# Patient Record
Sex: Female | Born: 1950
Health system: Southern US, Community
[De-identification: ages and names within clinical notes are randomized; demographics above are authoritative.]

## PROBLEM LIST (undated history)

## (undated) DIAGNOSIS — N200 Calculus of kidney: Secondary | ICD-10-CM

## (undated) DIAGNOSIS — R32 Unspecified urinary incontinence: Secondary | ICD-10-CM

## (undated) DIAGNOSIS — I10 Essential (primary) hypertension: Secondary | ICD-10-CM

## (undated) DIAGNOSIS — M1990 Unspecified osteoarthritis, unspecified site: Secondary | ICD-10-CM

## (undated) DIAGNOSIS — E119 Type 2 diabetes mellitus without complications: Secondary | ICD-10-CM

## (undated) DIAGNOSIS — N390 Urinary tract infection, site not specified: Secondary | ICD-10-CM

## (undated) DIAGNOSIS — Z972 Presence of dental prosthetic device (complete) (partial): Secondary | ICD-10-CM

## (undated) DIAGNOSIS — T7840XA Allergy, unspecified, initial encounter: Secondary | ICD-10-CM

## (undated) HISTORY — PX: TONSILLECTOMY: SUR1361

## (undated) HISTORY — DX: Urinary tract infection, site not specified: N39.0

## (undated) HISTORY — PX: APPENDECTOMY: SHX54

## (undated) HISTORY — DX: Unspecified urinary incontinence: R32

## (undated) HISTORY — DX: Calculus of kidney: N20.0

## (undated) HISTORY — DX: Unspecified osteoarthritis, unspecified site: M19.90

## (undated) HISTORY — DX: Allergy, unspecified, initial encounter: T78.40XA

---

## 1991-04-22 HISTORY — PX: CHOLECYSTECTOMY: SHX55

## 2012-05-22 DEATH — deceased

## 2016-07-08 ENCOUNTER — Encounter: Payer: Self-pay | Admitting: Family Medicine

## 2016-07-08 DIAGNOSIS — R32 Unspecified urinary incontinence: Secondary | ICD-10-CM

## 2016-07-08 DIAGNOSIS — Z889 Allergy status to unspecified drugs, medicaments and biological substances status: Secondary | ICD-10-CM

## 2016-07-08 DIAGNOSIS — M1990 Unspecified osteoarthritis, unspecified site: Secondary | ICD-10-CM

## 2016-07-08 DIAGNOSIS — M1711 Unilateral primary osteoarthritis, right knee: Secondary | ICD-10-CM | POA: Insufficient documentation

## 2016-07-11 ENCOUNTER — Encounter: Payer: Self-pay | Admitting: Family Medicine

## 2016-07-11 ENCOUNTER — Ambulatory Visit (INDEPENDENT_AMBULATORY_CARE_PROVIDER_SITE_OTHER): Payer: Medicare Other | Admitting: Family Medicine

## 2016-07-11 VITALS — BP 142/88 | HR 69 | Temp 98.2°F | Resp 16 | Ht 62.0 in | Wt 163.5 lb

## 2016-07-11 DIAGNOSIS — E663 Overweight: Secondary | ICD-10-CM

## 2016-07-11 DIAGNOSIS — Z1211 Encounter for screening for malignant neoplasm of colon: Secondary | ICD-10-CM

## 2016-07-11 DIAGNOSIS — M1711 Unilateral primary osteoarthritis, right knee: Secondary | ICD-10-CM | POA: Diagnosis not present

## 2016-07-11 DIAGNOSIS — S61012A Laceration without foreign body of left thumb without damage to nail, initial encounter: Secondary | ICD-10-CM | POA: Diagnosis not present

## 2016-07-11 DIAGNOSIS — E1165 Type 2 diabetes mellitus with hyperglycemia: Secondary | ICD-10-CM

## 2016-07-11 DIAGNOSIS — R030 Elevated blood-pressure reading, without diagnosis of hypertension: Secondary | ICD-10-CM

## 2016-07-11 DIAGNOSIS — Z1231 Encounter for screening mammogram for malignant neoplasm of breast: Secondary | ICD-10-CM

## 2016-07-11 DIAGNOSIS — Z114 Encounter for screening for human immunodeficiency virus [HIV]: Secondary | ICD-10-CM

## 2016-07-11 DIAGNOSIS — Z23 Encounter for immunization: Secondary | ICD-10-CM | POA: Diagnosis not present

## 2016-07-11 DIAGNOSIS — E1169 Type 2 diabetes mellitus with other specified complication: Secondary | ICD-10-CM | POA: Insufficient documentation

## 2016-07-11 DIAGNOSIS — Z1159 Encounter for screening for other viral diseases: Secondary | ICD-10-CM | POA: Diagnosis not present

## 2016-07-11 DIAGNOSIS — Z7689 Persons encountering health services in other specified circumstances: Secondary | ICD-10-CM | POA: Diagnosis not present

## 2016-07-11 DIAGNOSIS — R7309 Other abnormal glucose: Secondary | ICD-10-CM

## 2016-07-11 DIAGNOSIS — Z1239 Encounter for other screening for malignant neoplasm of breast: Secondary | ICD-10-CM

## 2016-07-11 DIAGNOSIS — E669 Obesity, unspecified: Secondary | ICD-10-CM | POA: Insufficient documentation

## 2016-07-11 DIAGNOSIS — E119 Type 2 diabetes mellitus without complications: Secondary | ICD-10-CM | POA: Insufficient documentation

## 2016-07-11 DIAGNOSIS — Z205 Contact with and (suspected) exposure to viral hepatitis: Secondary | ICD-10-CM

## 2016-07-11 DIAGNOSIS — IMO0001 Reserved for inherently not codable concepts without codable children: Secondary | ICD-10-CM

## 2016-07-11 DIAGNOSIS — I10 Essential (primary) hypertension: Secondary | ICD-10-CM | POA: Insufficient documentation

## 2016-07-11 MED ORDER — VITAMIN D3 50 MCG (2000 UT) PO CAPS: 2000.0000 [IU] | ORAL_CAPSULE | Freq: Every day | ORAL | Status: DC

## 2016-07-11 NOTE — Assessment & Plan Note (Signed)
Prior Pre-DM, unclear range without results, had been improved in interval - No recent labs available - Check A1c

## 2016-07-11 NOTE — Patient Instructions (Signed)
Thank you for coming in to clinic today.  1. Ordered all fasting blood work through [facility] - orders are good for next 6 weeks  Will release results to MyChart with information  2. Keep up the good work overall - Now with warmer weather, recommend to resume regular exercise, try to follow a low sodium diet - Caution with caffeine, 1 drink daily is fine, try to limit the pop to reduce sugar/caffeine may help BP - Check BP more regularly now about few times weekly for now to make sure it is not staying elevated, if it remains >140/90 persistently then we should re-consider diagnosis of HTN and discuss medications  PNeumonia vaccine (Prevnar -13) today - next due 1 year for PNeumovax-23  TDap today, good for 10 years ----- [facility] [address] [place], [place] [address] Phone: [phone] Call anytime to schedule now that order has been placed - FIRST Mammogram you likely need to go in person to sign their paperwork first and get old mammogram records ------  Colon Cancer Screening: - For all adults age 50+ routine colon cancer screening is highly recommended. - Early detection of colon cancer is important, because often there are no warning signs or symptoms, also if found early usually it can be cured. Late stage is hard to treat.  - If you are not interested in Colonoscopy screening (if done and normal you could be cleared for 5 to 10 years until next due), then Cologuard is an excellent alternative for screening test for Colon Cancer. It is highly sensitive for detecting DNA of colon cancer from even the earliest stages. Also, there is NO bowel prep required. - If Cologuard is NEGATIVE, then it is good for 3 years before next due - If Cologuard is POSITIVE, then it is strongly advised to get a Colonoscopy, which allows the GI doctor to locate the source of the cancer or polyp (even very early stage) and treat it by removing  it. ------------------------- If you would like to proceed with Cologuard (stool DNA test) - FIRST, call your insurance company and tell them you want to check cost of Cologuard tell them CPT Code 81528 (it may be completely covered and you could get for no cost, OR max cost without any coverage is about $600). Also, keep in mind if you do NOT open the kit, and decide not to do the test, you will NOT be charged, you should contact the company if you decide not to do the test. - If you want to proceed, you can notify us (phone message, MyChart Message, or at next visit) and we will order it for you. The test kit will be delivered to you house within about 1 week. Follow instructions to collect sample, you may call the company for any help or questions, 24/7 telephone support at [phone].   Please schedule a follow-up appointment with Dr. Karamalegos in 3 months follow-up BP, Lab results  If you have any other questions or concerns, please feel free to call the clinic or send a message through MyChart. You may also schedule an earlier appointment if necessary.  Alexander Karamalegos, DO [facility], [facility]  

## 2016-07-11 NOTE — Assessment & Plan Note (Signed)
Asymptomatic today, h/o chronic R medial Knee pain without swelling without known injury or trauma. Suspected likely due to underlying osteoarthritis / DJD by history, without available imaging, also some mild OA in other joints (thumbs) - Able to bear weight, no knee instability, mechanical locking - No prior history of knee surgery, arthroscopy - responds well to very minimal conservative therapy  Plan: 1. Reassurance, conservative management 2. Continue rare PRN NSAID OTC Aleve - counseling on safe use 3. Emphsaized primarily should use Tylenol 500-1000mg  per dose TID PRN 4. If flare RICE therapy (rest, ice, compression, elevation) for swelling, activity modification 5. Future can consider Knee x-rays for baseline 6. Follow-up PRN

## 2016-07-11 NOTE — Progress Notes (Addendum)
Subjective:    Patient ID: Andrea Dodson, female    DOB: 02/22/1951, 66 y.o.   MRN: [id number]  Andrea Dodson is a 66 y.o. female presenting on 07/11/2016 for [place] care as new patient. Her sister is already established with me as patient. Previously followed by PCP Dr Eudelia Bunch [facility], has moved to [place] 1.5 years ago, last PCP visit approx 2016).  HPI   Elevated BP without history of HTN: Reports checks BP at home with her machine occasionally, about 2 x monthly, average 140s/70-80s, seems like it has gradually increased over years. She admits being very sensitive to weather pressure and at times feels this may be related to blood pressure. Never diagnosed with BP before. Current Meds - never on any anti-HTN meds before    OVERWEIGHT BMI >29 / History of Pre-Diabetes vs Impaired Fasting Glucose - Reports prior history of Pre-DM but unsure last A1c, has been >2 years, had been improved after lifestyle changes. Prior weight >180 lbs in past 1.5 years ago, now down about 20 lbs with overall improved exercise and diet. Less active in winter. Since been in [place] >1 year has been less active and less dietary changes  Urinary incontinence, stress type only -Reports this problem is very mild and rare occurrence, but does have some stress urinary incontinence with sneezing, coughing, or stepping too hard at times. Only small amount of leakage - No other significant LUTS. Not been evaluated by Urology, not interested in procedure or treatment.  Osteoarthritis, Right Knee - Reports possible osteoarthritis in multiple joints, but only significant affected spot is Right knee, no significant injury to R knee, has had various childhood injuries but nothing significant, except jaw fracture at age 36 - Also symptoms of some stiffness and pain in her thumbs, occasionally, admits this to a lot of needle work, quilting - Buyer, retail OTC very rarely - Older half sister with RA (from  other parent, not blood related)  Remote Cardiac Work-up Reports she had issue 10-15 years ago, had some symptoms of abnormal heart rate/rhythm, with palpitations, she was sent to cardiology and had a normal cardiac cath.  History of Hep C Exposure  - Reports her husband was diagnosed with Hepatitis C he was treated early stages harvoni for 1 year that cured his Hep C. Self reported she gave blood donation and was negative Hep C HIV in past. - asking about Hep C screen test today   Health Maintenance: OTC Supplement - takes Vitamin D 5,000 units 3 times a day (twice a week only) - no known history of Vitamin D deficiency  Due for TDap today, she did have a cut injury on her finger and asking about tetanus shot  Due for Pneumonia vaccine 1st dose today (Prevnar-13)  - Last pap smear 2016 with reported normal, stated that was her last pap smear, states previously had never had abnormal  - Never had Colonoscopy or colon cancer screening, no known family history of colon CA. Intersted in cologuard, denies any significant abdominal symptoms, or bowel changes, dark stools, blood in stool  - Last mammogram, no known family history of breast cancer, last mammo 2015 outside, interested in this  Depression screen PHQ 2/9 07/11/2016  Decreased Interest 0  Down, Depressed, Hopeless 0  PHQ - 2 Score 0    Past Medical History:  Diagnosis Date  . Allergy   . Arthritis   . Urinary incontinence    Past Surgical History:  Procedure  Laterality Date  . APPENDECTOMY    . CHOLECYSTECTOMY  1993  . TONSILLECTOMY     Social History   Social History  . Marital status: Widowed    Spouse name: N/A  . Number of children: N/A  . Years of education: N/A   Occupational History  . Not on file.   Social History Main Topics  . Smoking status: Never Smoker  . Smokeless tobacco: Never Used  . Alcohol use No  . Drug use: No  . Sexual activity: Not on file   Other Topics Concern  . Not on file    Social History Narrative  . No narrative on file   Family History  Problem Relation Age of Onset  . Heart failure Mother   . Heart disease Mother   . Diabetes Mother   . Cancer Father     lung   Current Outpatient Prescriptions on File Prior to Visit  Medication Sig  . Krill Oil 350 MG CAPS Take 350 mg by mouth daily.   No current facility-administered medications on file prior to visit.     Review of Systems  Constitutional: Negative for activity change, appetite change, chills, diaphoresis, fatigue, fever and unexpected weight change.  HENT: Negative for congestion, hearing loss and sinus pressure.   Eyes: Negative for visual disturbance.  Respiratory: Negative for cough, chest tightness, shortness of breath and wheezing.   Cardiovascular: Negative for chest pain, palpitations and leg swelling.  Gastrointestinal: Negative for abdominal distention, abdominal pain, anal bleeding, blood in stool, constipation, diarrhea, nausea and vomiting.  Endocrine: Negative for cold intolerance and polyuria.  Genitourinary: Negative for difficulty urinating, dysuria, frequency, hematuria and urgency.       Rare urinary incontinence with sneeze, cough  Musculoskeletal: Positive for arthralgias (Occasional Right knee, sometimes thumbs). Negative for back pain, joint swelling and neck pain.  Skin: Negative for rash.  Allergic/Immunologic: Negative for environmental allergies.  Neurological: Negative for dizziness, weakness, light-headedness, numbness and headaches.  Hematological: Negative for adenopathy.  Psychiatric/Behavioral: Negative for behavioral problems, decreased concentration, dysphoric mood and sleep disturbance.   Per HPI unless specifically indicated above     Objective:    BP (!) 142/88 (BP Location: Left Arm, Cuff Size: Normal)   Pulse 69   Temp 98.2 F (36.8 C) (Oral)   Resp 16   Ht 5\' 2"  (1.575 m)   Wt 163 lb 8 oz (74.2 kg)   BMI 29.90 kg/m   Wt Readings from  Last 3 Encounters:  07/11/16 163 lb 8 oz (74.2 kg)    Physical Exam  Constitutional: She is oriented to person, place, and time. She appears well-developed and well-nourished. No distress.  Well-appearing, comfortable, cooperative, overweight  HENT:  Head: Normocephalic.  Mouth/Throat: Oropharynx is clear and moist.  Eyes: Conjunctivae are normal.  Neck: Normal range of motion. Neck supple. No thyromegaly present.  No carotid bruits  Cardiovascular: Normal rate, regular rhythm, normal heart sounds and intact distal pulses.   No murmur heard. Pulmonary/Chest: Effort normal and breath sounds normal. No respiratory distress. She has no wheezes. She has no rales.  Abdominal: Soft. Bowel sounds are normal. She exhibits no distension.  Musculoskeletal: Normal range of motion. She exhibits no edema or tenderness.  Bilateral Knees Inspection: Normal appearance and symmetrical. No ecchymosis or effusion. Palpation: Non-tender. Significant palpable and audible crepitus bilateral R>L, medial worst on R with snapping. ROM: Full active ROM bilaterally Strength: 5/5 intact knee flex/ext, ankle dorsi/plantarflex Neurovascular: distally intact sensation light touch  and pulses  Left Thumb with healing laceration on tip of finger, no surrounding erythema.  Lymphadenopathy:    She has no cervical adenopathy.  Neurological: She is alert and oriented to person, place, and time.  Distal sensation to light touch intact  Skin: Skin is warm and dry. No rash noted. She is not diaphoretic. No erythema.  Psychiatric: She has a normal mood and affect. Her behavior is normal.  Well groomed, good eye contact, normal speech and thoughts  Nursing note and vitals reviewed.   No results found for this or any previous visit.    Assessment & Plan:   Problem List Items Addressed This Visit    Overweight (BMI 25.0-29.9)    Overall wt loss in 1.5 years, reported 20 lbs. Reduced lifestyle changes recently, worse in  winter. - Resume regular exercise, improved diet - Check baseline labs, CMET, Lipids, A1c, TSH      Relevant Orders   Lipid panel   Osteoarthritis of right knee    Asymptomatic today, h/o chronic R medial Knee pain without swelling without known injury or trauma. Suspected likely due to underlying osteoarthritis / DJD by history, without available imaging, also some mild OA in other joints (thumbs) - Able to bear weight, no knee instability, mechanical locking - No prior history of knee surgery, arthroscopy - responds well to very minimal conservative therapy  Plan: 1. Reassurance, conservative management 2. Continue rare PRN NSAID OTC Aleve - counseling on safe use 3. Emphsaized primarily should use Tylenol 500-1000mg  per dose TID PRN 4. If flare RICE therapy (rest, ice, compression, elevation) for swelling, activity modification 5. Future can consider Knee x-rays for baseline 6. Follow-up PRN      Elevated BP without diagnosis of hypertension    Concern with mild to moderately elevated BP, initially then improved on re-check still elevated >SBP 140, and high 80 DBP. Outside readings similar but slightly better. No known complications or prior dx HTN  Plan: 1. Given no prior dx, will wait for repeat BP measurement in office to confirm, and allow to improve lifestyle habits now with improve diet, low sodium, regular exercise again 2. Monitor BP outside office - return sooner if elevated >140/90 consistently 3. Follow-up within 3 months BP, strongly consider new dx HTN, new start med at that time if still elevated, likely Thiazide vs CCB      Relevant Orders   Comprehensive metabolic panel   Lipid panel   TSH   Colon cancer screening    Due for routine colon cancer screening. Never had colonoscopy (not interested), no family history colon cancer. - Discussion today about recommendations for either Colonoscopy or Cologuard screening, benefits and risks of screening, interested in  Cologuard, understands that if positive then recommendation is for diagnostic colonoscopy to follow-up. - Ordered Cologuard today      Relevant Orders   Cologuard   Abnormal glucose    Prior Pre-DM, unclear range without results, had been improved in interval - No recent labs available - Check A1c      Relevant Orders   Hemoglobin A1c    Other Visit Diagnoses    Encounter to establish care with new doctor    -  Primary   Relevant Orders   Comprehensive metabolic panel   Lipid panel   Need for Tdap vaccination       Relevant Orders   Tdap vaccine greater than or equal to 7yo IM (Completed)   Need for pneumococcal vaccine  Ordered Prevnar 13 for first dose at age 42, then 1 year due pneumovax 23   Relevant Orders   Pneumococcal conjugate vaccine 13-valent IM (Completed)   Laceration of left thumb without foreign body without damage to nail, initial encounter       Minor laceration on Thumb, will cover with requested TDap also due for this today   Relevant Orders   Tdap vaccine greater than or equal to 7yo IM (Completed)   Screening for HIV (human immunodeficiency virus)       Relevant Orders   HIV antibody   Exposure to hepatitis C       Relevant Orders   Hepatitis C antibody   Need for hepatitis C screening test       Check Hep C screen today given exposure with husband, prior reported negatives reassuring and asymptomatic   Relevant Orders   Hepatitis C antibody   Screening for breast cancer       Ordered screening mammogram, need to check with [place] with outside mammogram results from [place]   Relevant Orders   MM DIGITAL SCREENING BILATERAL      Meds ordered this encounter  Medications  . Cholecalciferol (VITAMIN D3) 2000 units capsule    Sig: Take 1 capsule (2,000 Units total) by mouth daily.   Reduce dose Vitamin D from 5,000 TID 2 days a week to 2,000 daily maintenance, - declined to check Vitamin D today  Follow up plan: Return in about 3  months (around 10/11/2016) for blood pressure.  [name], [facility] 07/11/2016, 9:59 PM

## 2016-07-11 NOTE — Assessment & Plan Note (Signed)
Due for routine colon cancer screening. Never had colonoscopy (not interested), no family history colon cancer. - Discussion today about recommendations for either Colonoscopy or Cologuard screening, benefits and risks of screening, interested in Cologuard, understands that if positive then recommendation is for diagnostic colonoscopy to follow-up. - Ordered Cologuard today

## 2016-07-11 NOTE — Assessment & Plan Note (Signed)
Overall wt loss in 1.5 years, reported 20 lbs. Reduced lifestyle changes recently, worse in winter. - Resume regular exercise, improved diet - Check baseline labs, CMET, Lipids, A1c, TSH

## 2016-07-11 NOTE — Assessment & Plan Note (Signed)
Concern with mild to moderately elevated BP, initially then improved on re-check still elevated >SBP 140, and high 80 DBP. Outside readings similar but slightly better. No known complications or prior dx HTN  Plan: 1. Given no prior dx, will wait for repeat BP measurement in office to confirm, and allow to improve lifestyle habits now with improve diet, low sodium, regular exercise again 2. Monitor BP outside office - return sooner if elevated >140/90 consistently 3. Follow-up within 3 months BP, strongly consider new dx HTN, new start med at that time if still elevated, likely Thiazide vs CCB

## 2016-07-15 DIAGNOSIS — R030 Elevated blood-pressure reading, without diagnosis of hypertension: Secondary | ICD-10-CM | POA: Diagnosis not present

## 2016-07-15 DIAGNOSIS — R7309 Other abnormal glucose: Secondary | ICD-10-CM | POA: Diagnosis not present

## 2016-07-15 DIAGNOSIS — Z7689 Persons encountering health services in other specified circumstances: Secondary | ICD-10-CM | POA: Diagnosis not present

## 2016-07-16 ENCOUNTER — Telehealth: Payer: Self-pay | Admitting: Family Medicine

## 2016-07-16 LAB — COMPREHENSIVE METABOLIC PANEL
ALT: 8 IU/L (ref 0–32)
AST: 21 IU/L (ref 0–40)
Albumin/Globulin Ratio: 1.7 (ref 1.2–2.2)
Albumin: 4.4 g/dL (ref 3.6–4.8)
Alkaline Phosphatase: 125 IU/L — ABNORMAL HIGH (ref 39–117)
BILIRUBIN TOTAL: 0.7 mg/dL (ref 0.0–1.2)
BUN/Creatinine Ratio: 13 (ref 12–28)
BUN: 8 mg/dL (ref 8–27)
CALCIUM: 9.8 mg/dL (ref 8.7–10.3)
CHLORIDE: 96 mmol/L (ref 96–106)
CO2: 24 mmol/L (ref 18–29)
Creatinine, Ser: 0.62 mg/dL (ref 0.57–1.00)
GFR calc Af Amer: 109 mL/min/{1.73_m2} (ref >59)
GFR, EST NON AFRICAN AMERICAN: 95 mL/min/{1.73_m2} (ref >59)
GLUCOSE: 138 mg/dL — AB (ref 65–99)
Globulin, Total: 2.6 g/dL (ref 1.5–4.5)
Potassium: 4.2 mmol/L (ref 3.5–5.2)
Sodium: 136 mmol/L (ref 134–144)
TOTAL PROTEIN: 7 g/dL (ref 6.0–8.5)

## 2016-07-16 LAB — LIPID PANEL
Chol/HDL Ratio: 4 ratio units (ref 0.0–4.4)
Cholesterol, Total: 202 mg/dL — ABNORMAL HIGH (ref 100–199)
HDL: 50 mg/dL (ref >39)
LDL Calculated: 102 mg/dL — ABNORMAL HIGH (ref 0–99)
Triglycerides: 250 mg/dL — ABNORMAL HIGH (ref 0–149)
VLDL CHOLESTEROL CAL: 50 mg/dL — AB (ref 5–40)

## 2016-07-16 LAB — HEPATITIS C ANTIBODY

## 2016-07-16 LAB — HIV ANTIBODY (ROUTINE TESTING W REFLEX): HIV Screen 4th Generation wRfx: NONREACTIVE

## 2016-07-16 LAB — HEMOGLOBIN A1C
Est. average glucose Bld gHb Est-mCnc: 223 mg/dL
HEMOGLOBIN A1C: 9.4 % — AB (ref 4.8–5.6)

## 2016-07-16 LAB — TSH: TSH: 1.91 u[IU]/mL (ref 0.450–4.500)

## 2016-07-16 MED ORDER — METFORMIN HCL 500 MG PO TABS: 500.0000 mg | ORAL_TABLET | Freq: Two times a day (BID) | ORAL | 5 refills | Status: DC

## 2016-07-16 NOTE — Telephone Encounter (Signed)
Called patient today 3/28 to review recent complete blood work done at last physical 1 week ago. Significant abnormality with A1c 9.4, concerning for new diagnosis Type 2 Diabetes, she has been Pre-Diabetic in past with concern of elevated glucose, so this is somewhat an expected result, but concern with degree of elevated A1c. Discussion on this result with respect to prognosis, management and complications of diabetes reviewed briefly, she suspects attributed to poor dietary choices with sugary foods and carbs, not adhering to any particular diet, also less activity exercise in winter, plans to improve these, no prior meds before, discussed starting Metformin titrate up 500mg  daily to BID, she has concern with taking larger pills, will try this for now. She is familiar with diabetes previously with her husband, and now her sister and other family members. Reviewed all other lab results as well, suspect some of her fatigue is from chronic hyperglycemia.  Follow-up as scheduled 3 months to re-check POC A1c, monitor progress, adjust metformin, review lifestyle, discuss possible ASA 81 vs statin therapy in diabetes, also can evaluate BP to see if new HTN dx as well.  [name], [facility] 07/16/2016, 12:11 PM

## 2016-07-16 NOTE — Addendum Note (Signed)
Addended by: Olin Hauser on: 07/16/2016 12:03 PM   Modules accepted: Orders

## 2016-07-21 DIAGNOSIS — Z1211 Encounter for screening for malignant neoplasm of colon: Secondary | ICD-10-CM | POA: Diagnosis not present

## 2016-07-21 DIAGNOSIS — Z1212 Encounter for screening for malignant neoplasm of rectum: Secondary | ICD-10-CM | POA: Diagnosis not present

## 2016-07-21 LAB — COLOGUARD: Cologuard: NEGATIVE

## 2016-10-10 ENCOUNTER — Ambulatory Visit: Payer: Self-pay | Admitting: Family Medicine

## 2016-10-17 ENCOUNTER — Encounter: Payer: Self-pay | Admitting: Family Medicine

## 2016-10-17 ENCOUNTER — Ambulatory Visit (INDEPENDENT_AMBULATORY_CARE_PROVIDER_SITE_OTHER): Payer: Medicare Other | Admitting: Family Medicine

## 2016-10-17 VITALS — BP 144/70 | HR 63 | Temp 97.9°F | Ht 60.0 in | Wt 162.4 lb

## 2016-10-17 DIAGNOSIS — I10 Essential (primary) hypertension: Secondary | ICD-10-CM | POA: Diagnosis not present

## 2016-10-17 DIAGNOSIS — IMO0001 Reserved for inherently not codable concepts without codable children: Secondary | ICD-10-CM

## 2016-10-17 DIAGNOSIS — E1165 Type 2 diabetes mellitus with hyperglycemia: Secondary | ICD-10-CM

## 2016-10-17 LAB — POCT GLYCOSYLATED HEMOGLOBIN (HGB A1C): Hemoglobin A1C: 8.4 — AB (ref ?–5.7)

## 2016-10-17 LAB — POCT UA - MICROALBUMIN: Microalbumin Ur, POC: 50 mg/L

## 2016-10-17 MED ORDER — LISINOPRIL 10 MG PO TABS: 10.0000 mg | ORAL_TABLET | Freq: Every day | ORAL | 11 refills | Status: DC

## 2016-10-17 MED ORDER — GLUCOSE BLOOD VI STRP: ORAL_STRIP | 3 refills | Status: DC

## 2016-10-17 MED ORDER — ONETOUCH ULTRA 2 W/DEVICE KIT: PACK | 0 refills | Status: DC

## 2016-10-17 MED ORDER — ASPIRIN EC 81 MG PO TBEC: 81.0000 mg | DELAYED_RELEASE_TABLET | Freq: Every day | ORAL | Status: AC

## 2016-10-17 MED ORDER — ONETOUCH ULTRASOFT LANCETS MISC: 12 refills | Status: DC

## 2016-10-17 NOTE — Patient Instructions (Addendum)
Thank you for coming to the clinic today.  1.  Referral to Eye Doctor for yearly Diabetic Eye Check up - make sure that they send me a report, call them within 2 weeks if you dont hear back with an appointment  [facility] [address] [place], [place] [address] Phone: [phone]  Continue Metformin 500mg  twice daily with food.  Other option is Metformin Extended Release - 24 hour pill - these come in 500mg  or 750mg  would be 1-2 pills at ONCE in morning.  Check with pharmacy to see if they can tell you size and shape and compare, let me know if you want me to change this.  2. Think about the following meds:  Start baby Aspirin 81mg  daily to reduce risk heart attack and stroke  Cholesterol statin medication such as Atorvastatin or Rosuvastatin (Crestor) these reduce risk of heart attack and stroke, in future can consider these.  Also [place] - similar to invokana, lose sugar in urine, may inc risk of UTI and yeast infection  1. Bydureon BCise (Exenatide ER) - once weekly - this is my preference, very good medicine well tolerated, less side effects of nausea, upset stomach. No dose changes. Cost and coverage is the problem, but we may be able to get it with the coupon card  2. Trulicity (Dulaglutide) - once weekly - this is very good one, usually one of my top choices as well, two doses, 0.75 (likely we would start) and 1.5 max dose. We can use coupon card here too  3. Victoza (Liraglutide) - once DAILY - 3 dose changes 0.6, 1.2 and 1.8, side effects nausea, upset stomach higher on this one but it is still very effective medicine  [facility] [place], [place] [address] Phone: [phone] Call anytime to schedule now that order has been placed.  Please schedule a Follow-up Appointment to: Return in about 3 months (around 01/17/2017) for diabetes, blood pressure.  If you have any other questions or  concerns, please feel free to call the clinic or send a message through [place]. You may also schedule an earlier appointment if necessary.  Additionally, you may be receiving a survey about your experience at our clinic within a few days to 1 week by e-mail or mail. We value your feedback.  [name], DO [place]

## 2016-10-17 NOTE — Assessment & Plan Note (Addendum)
New diagnosis HTN today. Elevated initial BP, repeat manual check improved but still >140, similar to home readings.  No known complications    Plan:  1. Start new anti-HTN med - Lisinopril 10mg  daily - may cut in half for 5mg  daily for first few weeks. Also use ACEi for new DM with mild microalbuminuria today 2. Check BMET for K, Cr on new ACEi start within 2-3 weeks 3. Encourage improved lifestyle - low sodium diet, improve regular exercise 4. Continue monitor BP outside office, bring readings to next visit, if persistently >140/90 or new symptoms notify office sooner 5. Follow-up 3 months HTN

## 2016-10-17 NOTE — Assessment & Plan Note (Addendum)
Recent new diagnosed Type 2 DM, notable improvement but still suboptimally controlled DM with A1c 8.4 (down from 9.4 on new dx) No known complications or hypoglycemia.  Plan:  1. Discussion on new diagnosis, management, treatment options, complications and prognosis 2. Continue current treatment - Metformin 500mg  BID - need to improve adherence to this first, considered switch to XR for less pill burden as possibility, also reviewed SGLT2 vs GLP1 in future - she may consider GLP1 will check cost/coverage - Check Urine Microalbumin POC - 50, mild elevated. New start ACEi today 3. Encourage improved lifestyle - more regular meal schedule with AM breakfast, low carb, low sugar diet, reduce portion size, start regular exercise 4. Ordered DM testing supplies, OneTouch glucometer strips lancets - handout given for [facility] log, bring log to next visit for review 5. Start ASA 81mg  daily for primary prevention ASCVD. New start ACEi today. Briefly discussed indication for statin therapy reduce risk, will defer new med for now. 6. DM Foot exam done today. Advised need for DM ophtho exam, new referral sent [place], send record 7. Follow-up 3 months A1c

## 2016-10-17 NOTE — Progress Notes (Signed)
Subjective:    Patient ID: Andrea Dodson, female    DOB: 09/16/1950, 66 y.o.   MRN: [id number]  Andrea Dodson is a 66 y.o. female presenting on 10/17/2016 for Diabetes (hypertension)  Accompanied by her sister, [name].  HPI   CHRONIC DM, Type 2 - New diagnosis last visit 06/2016 Reports no new concerns. CBGs: Does not have glucometer - request OneTouch Ultra Meds: Metformin 558m BID - (at least taking 1 daily, rarely takes two, but trying to improve this, has "mental block" with taking pills since childhood, but she is still working on increasing to twice daily). Reports average compliance. Tolerating well w/o side-effects Currently not on ACEi / ARB - interested to start. Also never on Aspirin daily previously. Never on statin therapy. Lifestyle: - Diet (some erratic diet, only eats breakfast later in day, and craves less sugar)  - Exercise (outside frequently, yardwork, maybe not regular exercise walking) Denies hypoglycemia, polyuria, visual changes, numbness or tingling.  CHRONIC HTN - New Diagnosis (today, previously only Elevated without dx HTN) Reports checked home readings since last visit 06/2016 with fluctuating readings - SBP >130-140 to 150s at most, in office was elevated, [facility] Today mild elevated. She suspects new diagnosis HTN as well. Current Meds - None previously.   Denies CP, dyspnea, HA, edema, dizziness / lightheadedness  Health Maintenance: - Due for mammogram, last ordered 06/2016, patient will proceed with scheduling in future. Last mammo >4 years ago, no prior abnormality - UTD Prevnar-13, next due Pneumovax-23 in 1 year, 06/2017  Social History  Substance Use Topics  . Smoking status: Never Smoker  . Smokeless tobacco: Never Used  . Alcohol use No    Review of Systems Per HPI unless specifically indicated above     Objective:    BP (!) 144/70 (BP Location: Left Arm, Cuff Size: Normal)   Pulse 63    Temp 97.9 F (36.6 C) (Oral)   Ht 5' (1.524 m)   Wt 162 lb 6.4 oz (73.7 kg)   BMI 31.72 kg/m   Wt Readings from Last 3 Encounters:  10/17/16 162 lb 6.4 oz (73.7 kg)  07/11/16 163 lb 8 oz (74.2 kg)    Physical Exam  Constitutional: She is oriented to person, place, and time. She appears well-developed and well-nourished. No distress.  Well-appearing, comfortable, cooperative, overweight  HENT:  Head: Normocephalic and atraumatic.  Mouth/Throat: Oropharynx is clear and moist.  Eyes: Conjunctivae are normal. Right eye exhibits no discharge. Left eye exhibits no discharge.  Neck: Normal range of motion. Neck supple. No thyromegaly present.  Cardiovascular: Normal rate, regular rhythm, normal heart sounds and intact distal pulses.   No murmur heard. Pulmonary/Chest: Effort normal and breath sounds normal. No respiratory distress. She has no wheezes. She has no rales.  Musculoskeletal: Normal range of motion. She exhibits no edema.  Lymphadenopathy:    She has no cervical adenopathy.  Neurological: She is alert and oriented to person, place, and time.  Skin: Skin is warm and dry. No rash noted. She is not diaphoretic. No erythema.  Psychiatric: She has a normal mood and affect. Her behavior is normal.  Well groomed, good eye contact, normal speech and thoughts  Nursing note and vitals reviewed.    Diabetic Foot Exam - Simple   Simple Foot Form Diabetic Foot exam was performed with the following findings:  Yes 10/17/2016 10:44 AM  Visual Inspection No deformities, no ulcerations, no other skin breakdown  bilaterally:  Yes Sensation Testing Intact to touch and monofilament testing bilaterally:  Yes Pulse Check Posterior Tibialis and Dorsalis pulse intact bilaterally:  Yes Comments     Recent Labs  07/15/16 1106 10/17/16 1726  HGBA1C 9.4* 8.4*    Results for orders placed or performed in visit on 10/17/16  POCT glycosylated hemoglobin (Hb A1C)  Result Value Ref Range    Hemoglobin A1C 8.4 (A) 5.7  POCT UA - Microalbumin  Result Value Ref Range   Microalbumin Ur, POC 50 mg/L   Creatinine, POC  mg/dL   Albumin/Creatinine Ratio, Urine, POC        Assessment & Plan:   Problem List Items Addressed This Visit    Uncontrolled diabetes mellitus type 2 without complications ([place]) - Primary    Recent new diagnosed Type 2 DM, notable improvement but still suboptimally controlled DM with A1c 8.4 (down from 9.4 on new dx) No known complications or hypoglycemia.  Plan:  1. Discussion on new diagnosis, management, treatment options, complications and prognosis 2. Continue current treatment - Metformin 553m BID - need to improve adherence to this first, considered switch to XR for less pill burden as possibility, also reviewed SGLT2 vs GLP1 in future - she may consider GLP1 will check cost/coverage - Check Urine Microalbumin POC - 50, mild elevated. New start ACEi today 3. Encourage improved lifestyle - more regular meal schedule with AM breakfast, low carb, low sugar diet, reduce portion size, start regular exercise 4. Ordered DM testing supplies, OneTouch glucometer strips lancets - handout given for CBG log, bring log to next visit for review 5. Start ASA 869mdaily for primary prevention ASCVD. New start ACEi today. Briefly discussed indication for statin therapy reduce risk, will defer new med for now. 6. DM Foot exam done today. Advised need for DM ophtho exam, [facility] [place] [facility] 7. Follow-up 3 months A1c      Relevant Medications   Blood Glucose Monitoring Suppl (ONE TOUCH ULTRA 2) w/Device KIT   glucose blood (ONE TOUCH ULTRA TEST) test strip   Lancets (ONETOUCH ULTRASOFT) lancets   lisinopril (PRINIVIL,ZESTRIL) 10 MG tablet   aspirin EC 81 MG tablet   Other Relevant Orders   POCT glycosylated hemoglobin (Hb A1C) (Completed)   POCT UA - Microalbumin (Completed)   Ambulatory referral to Ophthalmology   BASIC METABOLIC PANEL WITH  GFR   Essential hypertension    New diagnosis HTN today. Elevated initial BP, repeat manual check improved but still >140, similar to home readings.  No known complications    Plan:  1. Start new anti-HTN med - Lisinopril 1043maily - may cut in half for 5mg80mily for first few weeks. Also use ACEi for new DM with mild microalbuminuria today 2. Check BMET for K, Cr on new ACEi start within 2-3 weeks 3. Encourage improved lifestyle - low sodium diet, improve regular exercise 4. Continue monitor BP outside office, bring readings to next visit, if persistently >140/90 or new symptoms notify office sooner 5. Follow-up 3 months HTN      Relevant Medications   lisinopril (PRINIVIL,ZESTRIL) 10 MG tablet   aspirin EC 81 MG tablet      Meds ordered this encounter  Medications  . Blood Glucose Monitoring Suppl (ONE TOUCH ULTRA 2) w/Device KIT    Sig: 1 device for checking blood sugar once daily    Dispense:  1 each    Refill:  0  . glucose blood (ONE TOUCH ULTRA  TEST) test strip    Sig: Check blood sugar 1 x daily    Dispense:  100 each    Refill:  3    For use with Ultra 2 meters  . Lancets (ONETOUCH ULTRASOFT) lancets    Sig: Use as instructed    Dispense:  100 each    Refill:  12  . lisinopril (PRINIVIL,ZESTRIL) 10 MG tablet    Sig: Take 1 tablet (10 mg total) by mouth daily. Start by cutting in half for 66m daily for 2-4 weeks    Dispense:  30 tablet    Refill:  11  . aspirin EC 81 MG tablet    Sig: Take 1 tablet (81 mg total) by mouth daily.   Follow up plan: Return in about 3 months (around 01/17/2017) for diabetes, blood pressure.  ANobie Putnam [facility] 10/17/2016, 5:31 PM

## 2016-11-05 ENCOUNTER — Other Ambulatory Visit: Payer: Medicare Other

## 2016-11-05 DIAGNOSIS — IMO0001 Reserved for inherently not codable concepts without codable children: Secondary | ICD-10-CM

## 2016-11-05 DIAGNOSIS — E1165 Type 2 diabetes mellitus with hyperglycemia: Principal | ICD-10-CM

## 2016-11-06 LAB — BASIC METABOLIC PANEL WITH GFR
BUN: 12 mg/dL (ref 7–25)
CHLORIDE: 101 mmol/L (ref 98–110)
CO2: 19 mmol/L — ABNORMAL LOW (ref 20–31)
Calcium: 9.4 mg/dL (ref 8.6–10.4)
Creat: 0.66 mg/dL (ref 0.50–0.99)
Glucose, Bld: 123 mg/dL — ABNORMAL HIGH (ref 65–99)
POTASSIUM: 4.5 mmol/L (ref 3.5–5.3)
SODIUM: 134 mmol/L — AB (ref 135–146)

## 2016-11-18 ENCOUNTER — Other Ambulatory Visit: Payer: Self-pay

## 2016-11-18 DIAGNOSIS — IMO0001 Reserved for inherently not codable concepts without codable children: Secondary | ICD-10-CM

## 2016-11-18 DIAGNOSIS — I10 Essential (primary) hypertension: Secondary | ICD-10-CM

## 2016-11-18 DIAGNOSIS — E1165 Type 2 diabetes mellitus with hyperglycemia: Principal | ICD-10-CM

## 2016-11-18 MED ORDER — LISINOPRIL 10 MG PO TABS: 10.0000 mg | ORAL_TABLET | Freq: Every day | ORAL | 1 refills | Status: DC

## 2016-11-18 MED ORDER — METFORMIN HCL 500 MG PO TABS: 500.0000 mg | ORAL_TABLET | Freq: Two times a day (BID) | ORAL | 1 refills | Status: DC

## 2016-12-22 ENCOUNTER — Encounter: Payer: Self-pay | Admitting: Family Medicine

## 2016-12-22 DIAGNOSIS — E1165 Type 2 diabetes mellitus with hyperglycemia: Principal | ICD-10-CM

## 2016-12-22 DIAGNOSIS — IMO0001 Reserved for inherently not codable concepts without codable children: Secondary | ICD-10-CM

## 2016-12-23 MED ORDER — LOSARTAN POTASSIUM 50 MG PO TABS: 50.0000 mg | ORAL_TABLET | Freq: Every day | ORAL | 5 refills | Status: DC

## 2016-12-30 ENCOUNTER — Ambulatory Visit (INDEPENDENT_AMBULATORY_CARE_PROVIDER_SITE_OTHER): Payer: Medicare Other

## 2016-12-30 DIAGNOSIS — Z23 Encounter for immunization: Secondary | ICD-10-CM | POA: Diagnosis not present

## 2017-01-20 ENCOUNTER — Encounter: Payer: Self-pay | Admitting: Family Medicine

## 2017-01-20 ENCOUNTER — Ambulatory Visit (INDEPENDENT_AMBULATORY_CARE_PROVIDER_SITE_OTHER): Payer: Medicare Other | Admitting: Family Medicine

## 2017-01-20 ENCOUNTER — Other Ambulatory Visit: Payer: Self-pay | Admitting: Family Medicine

## 2017-01-20 VITALS — BP 137/73 | HR 69 | Temp 97.9°F | Resp 16 | Ht 60.0 in | Wt 159.0 lb

## 2017-01-20 DIAGNOSIS — E669 Obesity, unspecified: Secondary | ICD-10-CM | POA: Diagnosis not present

## 2017-01-20 DIAGNOSIS — E1169 Type 2 diabetes mellitus with other specified complication: Secondary | ICD-10-CM | POA: Diagnosis not present

## 2017-01-20 DIAGNOSIS — I10 Essential (primary) hypertension: Secondary | ICD-10-CM | POA: Diagnosis not present

## 2017-01-20 DIAGNOSIS — E119 Type 2 diabetes mellitus without complications: Secondary | ICD-10-CM

## 2017-01-20 DIAGNOSIS — H6981 Other specified disorders of Eustachian tube, right ear: Secondary | ICD-10-CM

## 2017-01-20 DIAGNOSIS — E785 Hyperlipidemia, unspecified: Secondary | ICD-10-CM | POA: Diagnosis not present

## 2017-01-20 LAB — POCT GLYCOSYLATED HEMOGLOBIN (HGB A1C): Hemoglobin A1C: 7.3 — AB (ref ?–5.7)

## 2017-01-20 MED ORDER — LORATADINE 10 MG PO TABS: 10.0000 mg | ORAL_TABLET | Freq: Every day | ORAL | 11 refills | Status: DC

## 2017-01-20 MED ORDER — ROSUVASTATIN CALCIUM 10 MG PO TABS: 10.0000 mg | ORAL_TABLET | Freq: Every day | ORAL | 5 refills | Status: DC

## 2017-01-20 MED ORDER — FLUTICASONE PROPIONATE 50 MCG/ACT NA SUSP: 2.0000 | Freq: Every day | NASAL | 6 refills | Status: DC

## 2017-01-20 NOTE — Patient Instructions (Addendum)
Thank you for coming to the clinic today.  1. Start new Rosuvastatin 10mg  nightly - as discussed to reduce risk of cardiovascular problem in future - If new joint pain and muscle aches, can notify office if have symptoms or questions after few weeks, can adjust dose - Continue Aspirin 81  Continue Metformin 500mg  twice daily - A1c 7.3, great job!  Try OTC Loratadine (Claritin) or Allegra once daily pill and also OTC Flonase 2 sprays in each nostril daily for 4 weeks, if want a rx let me know.  May need ENT in future if worsening effusion fluid behind ear or other problem.  For Mammogram screening for breast cancer  Call the [facility] [place] below anytime to schedule your own appointment now that order has been placed.  [facility] [place], [place] [address] Phone: [phone]  Please schedule a Follow-up Appointment to: Return in about 6 months (around 07/21/2017) for Medicare Physical CPE.  If you have any other questions or concerns, please feel free to call the clinic or send a message through [facility]. You may also schedule an earlier appointment if necessary.  Additionally, you may be receiving a survey about your experience at our clinic within a few days to 1 week by e-mail or mail. We value your feedback.  [name], DO [place]

## 2017-01-20 NOTE — Assessment & Plan Note (Signed)
Mostly controlled cholesterol on lifestyle Fam history HyperTG Last lipid panel 06/2016 Calculated ASCVD 10 yr risk score 16.5%  Plan: 1. Discussed ASCVD risk impact and reduction, given some family history of CAD, agree to start new Statin therapy - Rosuvastatin 10mg  nightly, new rx 2. Continue ASA 81mg  for primary ASCVD risk reduction 3. Encourage improved lifestyle - low carb/cholesterol, reduce portion size, continue improving regular exercise 4. Follow-up 6 months - likely yearly lipids only

## 2017-01-20 NOTE — Assessment & Plan Note (Addendum)
Cologuard negative 07/21/2016

## 2017-01-20 NOTE — Assessment & Plan Note (Signed)
Well-controlled HTN, only borderline elevated SBP in office, home readings better No known complications  Failed: Lisinopril (ACEi cough)   Plan:  1. Continue current BP regimen Losartan 50mg  daily 2. Encourage improved lifestyle - low sodium diet, regular exercise 3. Continue monitor BP outside office, bring readings to next visit, if persistently >140/90 or new symptoms notify office sooner 4. Follow-up q 6 months now HTN / labs physical

## 2017-01-20 NOTE — Assessment & Plan Note (Addendum)
Still significantly improved and now well-controlled DM with A1c 7.3 (improved from 8.2>4.2>) No known complications or hypoglycemia.  Plan:  1. Continue current therapy - Metformin 500mg  BID - reviewed potential room for inc dose if need in future. Defer on SGLT2/GLP1, concern cost per patient 2. Encourage improved lifestyle - low carb, low sugar diet, reduce portion size, continue improving regular exercise 3. Check CBG, bring log to next visit for review 4. Continue ASA, ARB - see A&P, new start Statin today due to ASCVD risk 5. Again advised to DM ophtho exam, scheduled 02/04/17, send record 6. Follow-up now q 6 months - labs/physical

## 2017-01-20 NOTE — Assessment & Plan Note (Signed)
Still gradual wt loss with healthier lifestyle and improvements Encourage continue good work

## 2017-01-20 NOTE — Progress Notes (Signed)
Subjective:    Patient ID: Andrea Dodson, female    DOB: 27-May-1950, 66 y.o.   MRN: [id number]  Andrea Dodson is a 66 y.o. female presenting on 01/20/2017 for Hypertension and Diabetes (highest BS 192 and lowest 110)  Accompanied by sister, "Cookie"  HPI   CHRONIC DM, Type 2 Reports no new concerns, initial dx 06/2016, feels like she is doing well since last visit, better adherence CBGs: improved readings Meds: Metformin 500mg  BID -(no more missed dosing) Reports improved  compliance. Tolerating well w/o side-effects Currently ARB / ASA daily Lifestyle: - Diet (some improved regular meal habits, healthier choices) - Exercise (outside frequently, yardwork, maybe not regular exercise walking) - Scheduled [facility] for DM eye exam 10/17 Denies hypoglycemia, polyuria, visual changes, numbness or tingling.  HYPERLIPIDEMIA: - Reports no new concerns. Last lipid panel 06/2016, mostly controlled only mild abnormal - Currenlyt not on any statin or other cholesterol med - Interested in Statin from last discussion  R Ear effusion / URI - Additionally reports had recent cough, now lingering R ear fullness and pressure, history of problems with ears in past - Denies ear pain, sinus pain, fever/chills, purulence  CHRONIC HTN: Reports doing well since new dx last visit 3 months ago, started Lisinopril. Home BP checks normal. Current Meds - Losartan 50mg  daily - History of ACE cough on Lisinopril, since switched with resolve cough   Reports good compliance, took meds today. Tolerating well, w/o complaints. Denies CP, dyspnea, HA, edema, dizziness / lightheadedness  Health Maintenance: - Due for mammogram, last 2016, request order, and knows she needs to schedule. Asymptomatic - Last Cologuard 07/2016, negative, updated in HM - UTD on Flu Shot  Depression screen [facility] 2/9 01/20/2017 07/11/2016  Decreased Interest 0 0  Down, Depressed, Hopeless 0 0  PHQ - 2 Score 0 0    Social History    Substance Use Topics  . Smoking status: Never Smoker  . Smokeless tobacco: Never Used  . Alcohol use No    Review of Systems Per HPI unless specifically indicated above     Objective:    BP 137/73   Pulse 69   Temp 97.9 F (36.6 C) (Oral)   Resp 16   Ht 5' (1.524 m)   Wt 159 lb (72.1 kg)   BMI 31.05 kg/m   Wt Readings from Last 3 Encounters:  01/20/17 159 lb (72.1 kg)  10/17/16 162 lb 6.4 oz (73.7 kg)  07/11/16 163 lb 8 oz (74.2 kg)    Physical Exam  Constitutional: She is oriented to person, place, and time. She appears well-developed and well-nourished. No distress.  Well-appearing, comfortable, cooperative  HENT:  Head: Normocephalic and atraumatic.  Mouth/Throat: Oropharynx is clear and moist.  Frontal / maxillary sinuses non-tender. Nares patent without purulence or edema. R TM mild effusion with fullness without purulence or erythema. L minimal effusion to clear TM. Oropharynx clear without erythema, exudates, edema or asymmetry.  Eyes: Conjunctivae are normal. Right eye exhibits no discharge. Left eye exhibits no discharge.  Neck: Normal range of motion. Neck supple. No thyromegaly present.  Cardiovascular: Normal rate, regular rhythm, normal heart sounds and intact distal pulses.   No murmur heard. Pulmonary/Chest: Effort normal and breath sounds normal. No respiratory distress. She has no wheezes. She has no rales.  Musculoskeletal: Normal range of motion. She exhibits no edema.  Lymphadenopathy:    She has no cervical adenopathy.  Neurological: She is alert and oriented to person, place, and time.  Skin: Skin is warm and dry. No rash noted. She is not diaphoretic. No erythema.  Psychiatric: She has a normal mood and affect. Her behavior is normal.  Well groomed, good eye contact, normal speech and thoughts  Nursing note and vitals reviewed.   Recent Labs  07/15/16 1106 10/17/16 1726 01/20/17 1042  HGBA1C 9.4* 8.4* 7.3*    Results for orders placed  or performed in visit on 01/20/17  POCT HgB A1C  Result Value Ref Range   Hemoglobin A1C 7.3 (A) 5.7      Assessment & Plan:   Problem List Items Addressed This Visit    Controlled type 2 diabetes mellitus without complication ([place]) - Primary    Still significantly improved and now well-controlled DM with A1c 7.3 (improved from 1.9>1.4>) No known complications or hypoglycemia.  Plan:  1. Continue current therapy - Metformin 500mg  BID - reviewed potential room for inc dose if need in future. Defer on SGLT2/GLP1, concern cost per patient 2. Encourage improved lifestyle - low carb, low sugar diet, reduce portion size, continue improving regular exercise 3. Check CBG, bring log to next visit for review 4. Continue ASA, ARB - see A&P, new start Statin today due to ASCVD risk 5. Again advised to DM ophtho exam, scheduled 02/04/17, send record 6. Follow-up now q 6 months - labs/physical      Relevant Medications   rosuvastatin (CRESTOR) 10 MG tablet   Other Relevant Orders   POCT HgB A1C (Completed)   Essential hypertension    Well-controlled HTN, only borderline elevated SBP in office, home readings better No known complications  Failed: Lisinopril (ACEi cough)   Plan:  1. Continue current BP regimen Losartan 50mg  daily 2. Encourage improved lifestyle - low sodium diet, regular exercise 3. Continue monitor BP outside office, bring readings to next visit, if persistently >140/90 or new symptoms notify office sooner 4. Follow-up q 6 months now HTN / labs physical      Relevant Medications   rosuvastatin (CRESTOR) 10 MG tablet   Hyperlipidemia associated with type 2 diabetes mellitus ([place])    Mostly controlled cholesterol on lifestyle Fam history HyperTG Last lipid panel 06/2016 Calculated ASCVD 10 yr risk score 16.5%  Plan: 1. Discussed ASCVD risk impact and reduction, given some family history of CAD, agree to start new Statin therapy - Rosuvastatin 10mg  nightly, new rx 2.  Continue ASA 81mg  for primary ASCVD risk reduction 3. Encourage improved lifestyle - low carb/cholesterol, reduce portion size, continue improving regular exercise 4. Follow-up 6 months - likely yearly lipids only      Relevant Medications   rosuvastatin (CRESTOR) 10 MG tablet   Obesity (BMI 30.0-34.9)    Still gradual wt loss with healthier lifestyle and improvements Encourage continue good work       Other Visit Diagnoses    Dysfunction of right eustachian tube       R side, mild to moderate effusion, old history of chronic R ear problems and infection of canal. Trial on anti-histamine OTC, Flonase, f/u      Meds ordered this encounter  Medications  . rosuvastatin (CRESTOR) 10 MG tablet    Sig: Take 1 tablet (10 mg total) by mouth daily.    Dispense:  30 tablet    Refill:  5  . fluticasone (FLONASE) 50 MCG/ACT nasal spray    Sig: Place 2 sprays into both nostrils daily.    Dispense:  16 g    Refill:  6  . loratadine (CLARITIN)  10 MG tablet    Sig: Take 1 tablet (10 mg total) by mouth daily.    Dispense:  30 tablet    Refill:  11    Follow up plan: Return in about 6 months (around 07/21/2017) for Medicare Physical CPE.  [name], DO [facility] 01/20/2017, 11:36 PM

## 2017-02-04 LAB — HM DIABETES EYE EXAM

## 2017-02-14 ENCOUNTER — Other Ambulatory Visit: Payer: Self-pay | Admitting: Family Medicine

## 2017-02-14 DIAGNOSIS — E1165 Type 2 diabetes mellitus with hyperglycemia: Principal | ICD-10-CM

## 2017-02-14 DIAGNOSIS — IMO0001 Reserved for inherently not codable concepts without codable children: Secondary | ICD-10-CM

## 2017-03-03 ENCOUNTER — Encounter: Payer: Self-pay | Admitting: Family Medicine

## 2017-03-17 ENCOUNTER — Ambulatory Visit
Admission: RE | Admit: 2017-03-17 | Discharge: 2017-03-17 | Disposition: A | Discharge status: Home / Self Care | Payer: Medicare Other | Source: Ambulatory Visit | Attending: Family Medicine | Admitting: Family Medicine

## 2017-03-17 ENCOUNTER — Encounter: Payer: Self-pay | Admitting: Radiology

## 2017-03-17 DIAGNOSIS — Z1239 Encounter for other screening for malignant neoplasm of breast: Secondary | ICD-10-CM

## 2017-03-17 DIAGNOSIS — Z1231 Encounter for screening mammogram for malignant neoplasm of breast: Secondary | ICD-10-CM | POA: Insufficient documentation

## 2017-06-02 ENCOUNTER — Other Ambulatory Visit: Payer: Self-pay | Admitting: Family Medicine

## 2017-06-02 DIAGNOSIS — IMO0001 Reserved for inherently not codable concepts without codable children: Secondary | ICD-10-CM

## 2017-06-02 DIAGNOSIS — E1165 Type 2 diabetes mellitus with hyperglycemia: Principal | ICD-10-CM

## 2017-06-16 ENCOUNTER — Other Ambulatory Visit: Payer: Self-pay | Admitting: Family Medicine

## 2017-06-16 DIAGNOSIS — E1169 Type 2 diabetes mellitus with other specified complication: Secondary | ICD-10-CM

## 2017-06-16 DIAGNOSIS — E785 Hyperlipidemia, unspecified: Principal | ICD-10-CM

## 2017-06-17 ENCOUNTER — Other Ambulatory Visit: Payer: Self-pay | Admitting: Family Medicine

## 2017-06-17 DIAGNOSIS — E785 Hyperlipidemia, unspecified: Principal | ICD-10-CM

## 2017-06-17 DIAGNOSIS — E1169 Type 2 diabetes mellitus with other specified complication: Secondary | ICD-10-CM

## 2017-06-17 MED ORDER — ROSUVASTATIN CALCIUM 10 MG PO TABS: 10.0000 mg | ORAL_TABLET | Freq: Every day | ORAL | 3 refills | Status: DC

## 2017-07-08 ENCOUNTER — Encounter: Payer: Self-pay | Admitting: Family Medicine

## 2017-07-09 NOTE — Telephone Encounter (Signed)
Called patient back instead of initial mychart response.  Spoke with Andrea Dodson, she is currently with her sister, "Andrea Dodson" in the hospital. She has had some loose stool watery diarrhea for 3 days now, but today seems improved. Denies fever chills abdominal pain blood in stool nausea vomiting. She stopped metformin today and that helped some. She took some OTC meds with relief. She is asking if can be due to hospital.  I advised her there is a chance could be exposed to C Diff in hospital setting. We may need to do a stool test in office next week if still having symptoms. She can schedule f/u or contact us back maybe check lab and then return to office few days later for follow-up.  She can monitor symptoms for now, improve hydration, hold metformin for few days, and see if resolves, may be viral as well.  Andrea Dodson, [facility] 07/09/2017, 12:33 PM

## 2017-08-04 ENCOUNTER — Other Ambulatory Visit: Payer: Medicare Other

## 2017-08-04 ENCOUNTER — Ambulatory Visit: Payer: Medicare Other

## 2017-08-04 DIAGNOSIS — E669 Obesity, unspecified: Secondary | ICD-10-CM

## 2017-08-04 DIAGNOSIS — E119 Type 2 diabetes mellitus without complications: Secondary | ICD-10-CM

## 2017-08-04 DIAGNOSIS — I10 Essential (primary) hypertension: Secondary | ICD-10-CM | POA: Diagnosis not present

## 2017-08-04 DIAGNOSIS — E785 Hyperlipidemia, unspecified: Secondary | ICD-10-CM | POA: Diagnosis not present

## 2017-08-04 DIAGNOSIS — E1169 Type 2 diabetes mellitus with other specified complication: Secondary | ICD-10-CM

## 2017-08-05 LAB — CBC WITH DIFFERENTIAL/PLATELET
BASOS ABS: 50 {cells}/uL (ref 0–200)
Basophils Relative: 0.7 %
EOS ABS: 281 {cells}/uL (ref 15–500)
Eosinophils Relative: 3.9 %
HCT: 38.1 % (ref 35.0–45.0)
HEMOGLOBIN: 13.2 g/dL (ref 11.7–15.5)
Lymphs Abs: 1786 cells/uL (ref 850–3900)
MCH: 30.2 pg (ref 27.0–33.0)
MCHC: 34.6 g/dL (ref 32.0–36.0)
MCV: 87.2 fL (ref 80.0–100.0)
MPV: 9.2 fL (ref 7.5–12.5)
Monocytes Relative: 7.4 %
NEUTROS ABS: 4550 {cells}/uL (ref 1500–7800)
Neutrophils Relative %: 63.2 %
Platelets: 263 10*3/uL (ref 140–400)
RBC: 4.37 10*6/uL (ref 3.80–5.10)
RDW: 13 % (ref 11.0–15.0)
Total Lymphocyte: 24.8 %
WBC mixed population: 533 cells/uL (ref 200–950)
WBC: 7.2 10*3/uL (ref 3.8–10.8)

## 2017-08-05 LAB — COMPLETE METABOLIC PANEL WITH GFR
AG Ratio: 2 (calc) (ref 1.0–2.5)
ALBUMIN MSPROF: 4.3 g/dL (ref 3.6–5.1)
ALKALINE PHOSPHATASE (APISO): 89 U/L (ref 33–130)
ALT: 7 U/L (ref 6–29)
AST: 20 U/L (ref 10–35)
BUN: 9 mg/dL (ref 7–25)
CHLORIDE: 103 mmol/L (ref 98–110)
CO2: 27 mmol/L (ref 20–32)
Calcium: 9.4 mg/dL (ref 8.6–10.4)
Creat: 0.67 mg/dL (ref 0.50–0.99)
GFR, Est African American: 106 mL/min/{1.73_m2} (ref >=60)
GFR, Est Non African American: 92 mL/min/{1.73_m2} (ref >=60)
GLUCOSE: 134 mg/dL — AB (ref 65–99)
Globulin: 2.2 g/dL (calc) (ref 1.9–3.7)
Potassium: 3.8 mmol/L (ref 3.5–5.3)
Sodium: 137 mmol/L (ref 135–146)
Total Bilirubin: 0.7 mg/dL (ref 0.2–1.2)
Total Protein: 6.5 g/dL (ref 6.1–8.1)

## 2017-08-05 LAB — HEMOGLOBIN A1C
EAG (MMOL/L): 7.9 (calc)
HEMOGLOBIN A1C: 6.6 %{Hb} — AB (ref <5.7)
Mean Plasma Glucose: 143 (calc)

## 2017-08-05 LAB — LIPID PANEL
Cholesterol: 115 mg/dL (ref <200)
HDL: 50 mg/dL — ABNORMAL LOW (ref >50)
LDL CHOLESTEROL (CALC): 46 mg/dL
NON-HDL CHOLESTEROL (CALC): 65 mg/dL (ref <130)
TRIGLYCERIDES: 103 mg/dL (ref <150)
Total CHOL/HDL Ratio: 2.3 (calc) (ref <5.0)

## 2017-08-10 ENCOUNTER — Encounter: Payer: Self-pay | Admitting: Family Medicine

## 2017-08-10 ENCOUNTER — Ambulatory Visit (INDEPENDENT_AMBULATORY_CARE_PROVIDER_SITE_OTHER): Payer: Medicare Other | Admitting: Family Medicine

## 2017-08-10 VITALS — BP 130/78 | HR 70 | Temp 98.2°F | Resp 16 | Ht 60.0 in | Wt 156.0 lb

## 2017-08-10 DIAGNOSIS — E785 Hyperlipidemia, unspecified: Secondary | ICD-10-CM

## 2017-08-10 DIAGNOSIS — E669 Obesity, unspecified: Secondary | ICD-10-CM

## 2017-08-10 DIAGNOSIS — E119 Type 2 diabetes mellitus without complications: Secondary | ICD-10-CM

## 2017-08-10 DIAGNOSIS — Z0000 Encounter for general adult medical examination without abnormal findings: Secondary | ICD-10-CM | POA: Diagnosis not present

## 2017-08-10 DIAGNOSIS — I10 Essential (primary) hypertension: Secondary | ICD-10-CM | POA: Diagnosis not present

## 2017-08-10 DIAGNOSIS — E1169 Type 2 diabetes mellitus with other specified complication: Secondary | ICD-10-CM | POA: Diagnosis not present

## 2017-08-10 DIAGNOSIS — Z23 Encounter for immunization: Secondary | ICD-10-CM | POA: Diagnosis not present

## 2017-08-10 MED ORDER — LOSARTAN POTASSIUM 50 MG PO TABS: ORAL_TABLET | ORAL | 3 refills | Status: DC

## 2017-08-10 NOTE — Assessment & Plan Note (Signed)
Improved weight loss down to 156 lbs, approx down 6-7 lbs in 10 months by our scales Encourage continued improve diet and exercise wt loss, DM control

## 2017-08-10 NOTE — Assessment & Plan Note (Signed)
Significantly improved control still, now A1c down to 6.6 (from 7.3 to 8.4) No known complications or hypoglycemia.  Plan:  1. Continue current therapy - Metformin 500mg  BID 2. Encourage improved lifestyle - low carb, low sugar diet, reduce portion size, continue improving regular exercise 3. Check CBG, bring log to next visit for review 4. Continue ASA, ARB, Statin 5. UTD DM Eye / Foot - next due eye exam - [facility] 01/2018 6. Follow-up now q 6 months - A1c then in future may do yearly visit labs

## 2017-08-10 NOTE — Progress Notes (Signed)
Subjective:    Patient ID: Andrea Dodson, female    DOB: 16-Feb-1951, 67 y.o.   MRN: [id number]  Andrea Dodson is a 67 y.o. female presenting on 08/10/2017 for Annual Exam   HPI   Here for Annual Physical and Lab Review.  CHRONIC DM, Type 2 / OBESITY BMI >30 Reports no new concerns, doing well now adhering to her Metformin has reduced her craving for sweets, and doing well on this. Last A1c down to 6.6 CBGs: improved readings Meds: Metformin 531m BID Reports improved compliance. Tolerating well w/o side-effects Currently ARB / ASA daily Lifestyle: - Weight down 6-7 lbs in 10 months, similar to her scales - Diet (Improved healthier diet choices, adhering to DM diet, low sweets and sugars, she will occasionally eat a small snack pack of m&m at times) - Exercise (Stays active and busy, frequently, yardwork - has [place].5 acres and does a lot of walks and yard work) - Last DM Eye Exam completed - AAva10/17/18 - next due 01/2018 Denies hypoglycemia  CHRONIC HTN: Reports no new concerns. She is checking BP at home regularly, seems to be controlled, avg 120-130s / 70-80s Current Meds - Losartan 580mdaily - Prior history w/ ACEi cough on Lisinopril Reports good compliance, took meds today. Tolerating well, w/o complaints.  HYPERLIPIDEMIA: - Reports no new concerns. Last lipid panel 07/2017, mostly controlled only mild abnormal - Currently taking Rosuvastatin 10102maily - Taking ASA 43m16mily   PMH - Seasonal allergies - taking Loratadine 10mg58mly, controls her allergy symptoms, has done well this season  Additional complaints - Left ear fullness and itching, associated with some L lower jaw "glandular swelling" seems intermittent episodes, without pain or other problem. - Localized numbness chronic problem for years, left lateral knee area, without recent change or worsening or new problem  Health Maintenance:  UTD Last Mammogram 03/17/17, negative bi-rads  1  Last Cologuard 07/2016, negative  Due for 2nd pneumonia vaccine >1 year after previous vaccine (received Prevnar-13 in March 218), now to receive Pneumovax-23 today  Depression screen [facility]4/22[facility]2019 01/20/2017 07/11/2016  Decreased Interest 0 0 0  Down, Depressed, Hopeless 0 0 0  PHQ - 2 Score 0 0 0    Past Medical History:  Diagnosis Date  . Allergy   . Arthritis   . Urinary incontinence    Past Surgical History:  Procedure Laterality Date  . APPENDECTOMY    . CHOLECYSTECTOMY  1993  . TONSILLECTOMY     Social History   Socioeconomic History  . Marital status: Widowed    Spouse name: Not on file  . Number of children: Not on file  . Years of education: Not on file  . Highest education level: Not on file  Occupational History  . Not on file  Social Needs  . Financial resource strain: Not on file  . Food insecurity:    Worry: Not on file    Inability: Not on file  . Transportation needs:    Medical: Not on file    Non-medical: Not on file  Tobacco Use  . Smoking status: Never Smoker  . Smokeless tobacco: Never Used  Substance and Sexual Activity  . Alcohol use: No  . Drug use: No  . Sexual activity: Not on file  Lifestyle  . Physical activity:    Days per week: Not on file    Minutes per session: Not on file  . Stress: Not on file  Relationships  . Social  connections:    Talks on phone: Not on file    Gets together: Not on file    Attends religious service: Not on file    Active member of club or organization: Not on file    Attends meetings of clubs or organizations: Not on file    Relationship status: Not on file  . Intimate partner violence:    Fear of current or ex partner: Not on file    Emotionally abused: Not on file    Physically abused: Not on file    Forced sexual activity: Not on file  Other Topics Concern  . Not on file  Social History Narrative  . Not on file   Family History  Problem Relation Age of Onset  . Heart failure Mother    . Heart disease Mother   . Diabetes Mother   . Heart attack Mother   . Cancer Father        lung  . Heart attack Sister   . Diabetes Sister   . Breast cancer Sister 17   Current Outpatient Medications on File Prior to Visit  Medication Sig  . aspirin EC 81 MG tablet Take 1 tablet (81 mg total) by mouth daily.  . Blood Glucose Monitoring Suppl (ONE TOUCH ULTRA 2) w/Device KIT 1 device for checking blood sugar once daily  . fluticasone (FLONASE) 50 MCG/ACT nasal spray Place 2 sprays into both nostrils daily.  Marland Kitchen glucose blood (ONE TOUCH ULTRA TEST) test strip Check blood sugar 1 x daily  . Krill Oil 350 MG CAPS Take 350 mg by mouth daily.  . Lancets (ONETOUCH ULTRASOFT) lancets Use as instructed  . loratadine (CLARITIN) 10 MG tablet Take 1 tablet (10 mg total) by mouth daily.  . metFORMIN (GLUCOPHAGE) 500 MG tablet TAKE 1 TABLET BY MOUTH TWO  TIMES DAILY WITH A MEAL  . rosuvastatin (CRESTOR) 10 MG tablet Take 1 tablet (10 mg total) by mouth daily.   No current facility-administered medications on file prior to visit.     Review of Systems  Constitutional: Negative for activity change, appetite change, chills, diaphoresis, fatigue and fever.  HENT: Negative for congestion and hearing loss.   Eyes: Negative for visual disturbance.  Respiratory: Negative for apnea, cough, choking, chest tightness, shortness of breath and wheezing.   Cardiovascular: Negative for chest pain, palpitations and leg swelling.  Gastrointestinal: Negative for abdominal pain, anal bleeding, blood in stool, constipation, diarrhea, nausea and vomiting.  Endocrine: Negative for cold intolerance.  Genitourinary: Negative for decreased urine volume, difficulty urinating, dysuria, frequency, hematuria and urgency.  Musculoskeletal: Negative for arthralgias, back pain and neck pain.  Skin: Negative for rash.  Allergic/Immunologic: Positive for environmental allergies (controlled on anti histamine).  Neurological:  Negative for dizziness, weakness, light-headedness, numbness and headaches.  Hematological: Negative for adenopathy.  Psychiatric/Behavioral: Negative for behavioral problems, dysphoric mood and sleep disturbance. The patient is not nervous/anxious.    Per HPI unless specifically indicated above     Objective:    BP 130/78 (BP Location: Left Arm, Cuff Size: Normal)   Pulse 70   Temp 98.2 F (36.8 C) (Oral)   Resp 16   Ht 5' (1.524 m)   Wt 156 lb (70.8 kg)   BMI 30.47 kg/m   Wt Readings from Last 3 Encounters:  08/10/17 156 lb (70.8 kg)  01/20/17 159 lb (72.1 kg)  10/17/16 162 lb 6.4 oz (73.7 kg)    Physical Exam  Constitutional: She is oriented to person, place,  and time. She appears well-developed and well-nourished. No distress.  Well-appearing, comfortable, cooperative  HENT:  Head: Normocephalic and atraumatic.  Mouth/Throat: Oropharynx is clear and moist.  Frontal / maxillary sinuses non-tender. Nares patent without purulence or edema. Left TM is obstructed by dry cerumen partially impacted. R TM clear without erythema, effusion or bulging. Oropharynx clear without erythema, exudates, edema or asymmetry.  Eyes: Pupils are equal, round, and reactive to light. Conjunctivae and EOM are normal. Right eye exhibits no discharge. Left eye exhibits no discharge.  Neck: Normal range of motion. Neck supple. No thyromegaly present.  No palpable swelling or glandular problem  Cardiovascular: Normal rate, regular rhythm, normal heart sounds and intact distal pulses.  No murmur heard. Pulmonary/Chest: Effort normal and breath sounds normal. No respiratory distress. She has no wheezes. She has no rales.  Abdominal: Soft. Bowel sounds are normal. She exhibits no distension and no mass. There is no tenderness.  Musculoskeletal: Normal range of motion. She exhibits no edema or tenderness.  Upper / Lower Extremities: - Normal muscle tone, strength bilateral upper extremities 5/5, lower  extremities 5/5  Lymphadenopathy:    She has no cervical adenopathy.  Neurological: She is alert and oriented to person, place, and time.  Distal sensation intact to light touch all extremities- including Left lateral knee anteriorly chronic altered touch sensation and some itching tingling but still has sensation  Skin: Skin is warm and dry. No rash noted. She is not diaphoretic. No erythema.  Psychiatric: She has a normal mood and affect. Her behavior is normal.  Well groomed, good eye contact, normal speech and thoughts  Nursing note and vitals reviewed.  Results for orders placed or performed in visit on 08/04/17  CBC with Differential/Platelet  Result Value Ref Range   WBC 7.2 3.8 - 10.8 Thousand/uL   RBC 4.37 3.80 - 5.10 Million/uL   Hemoglobin 13.2 11.7 - 15.5 g/dL   HCT 38.1 35.0 - 45.0 %   MCV 87.2 80.0 - 100.0 fL   MCH 30.2 27.0 - 33.0 pg   MCHC 34.6 32.0 - 36.0 g/dL   RDW 13.0 11.0 - 15.0 %   Platelets 263 140 - 400 Thousand/uL   MPV 9.2 7.5 - 12.5 fL   Neutro Abs 4,550 1,500 - 7,800 cells/uL   Lymphs Abs 1,786 850 - 3,900 cells/uL   WBC mixed population 533 200 - 950 cells/uL   Eosinophils Absolute 281 15 - 500 cells/uL   Basophils Absolute 50 0 - 200 cells/uL   Neutrophils Relative % 63.2 %   Total Lymphocyte 24.8 %   Monocytes Relative 7.4 %   Eosinophils Relative 3.9 %   Basophils Relative 0.7 %  Lipid panel  Result Value Ref Range   Cholesterol 115 <200 mg/dL   HDL 50 (L) >50 mg/dL   Triglycerides 103 <150 mg/dL   LDL Cholesterol (Calc) 46 mg/dL (calc)   Total CHOL/HDL Ratio 2.3 <5.0 (calc)   Non-HDL Cholesterol (Calc) 65 <130 mg/dL (calc)  Hemoglobin A1c  Result Value Ref Range   Hgb A1c MFr Bld 6.6 (H) <5.7 % of total Hgb   Mean Plasma Glucose 143 (calc)   eAG (mmol/L) 7.9 (calc)  COMPLETE METABOLIC PANEL WITH GFR  Result Value Ref Range   Glucose, Bld 134 (H) 65 - 99 mg/dL   BUN 9 7 - 25 mg/dL   Creat 0.67 0.50 - 0.99 mg/dL   GFR, Est Non African  American 92 > OR = 60 mL/min/1.51m   GFR,  Est African American 106 > OR = 60 mL/min/1.63m   BUN/Creatinine Ratio NOT APPLICABLE 6 - 22 (calc)   Sodium 137 135 - 146 mmol/L   Potassium 3.8 3.5 - 5.3 mmol/L   Chloride 103 98 - 110 mmol/L   CO2 27 20 - 32 mmol/L   Calcium 9.4 8.6 - 10.4 mg/dL   Total Protein 6.5 6.1 - 8.1 g/dL   Albumin 4.3 3.6 - 5.1 g/dL   Globulin 2.2 1.9 - 3.7 g/dL (calc)   AG Ratio 2.0 1.0 - 2.5 (calc)   Total Bilirubin 0.7 0.2 - 1.2 mg/dL   Alkaline phosphatase (APISO) 89 33 - 130 U/L   AST 20 10 - 35 U/L   ALT 7 6 - 29 U/L   Recent Labs    10/17/16 1726 01/20/17 1042 08/04/17 0914  HGBA1C 8.4* 7.3* 6.6*        Assessment & Plan:   Problem List Items Addressed This Visit    Controlled type 2 diabetes mellitus without complication ([place]    Significantly improved control still, now A1c down to 6.6 (from 7.3 to 8.4) No known complications or hypoglycemia.  Plan:  1. Continue current therapy - Metformin 502mBID 2. Encourage improved lifestyle - low carb, low sugar diet, reduce portion size, continue improving regular exercise 3. Check CBG, bring log to next visit for review 4. Continue ASA, ARB, Statin 5. UTD DM Eye / Foot - next due eye exam - [facility] 01/2018 6. Follow-up now q 6 months - A1c then in future may do yearly visit labs      Relevant Medications   losartan (COZAAR) 50 MG tablet   Essential hypertension    Well-controlled HTN Home readings infrequent but controlled No known complications  Failed: Lisinopril (ACEi cough)    Plan:  1. Continue current BP regimen Losartan 5057maily - refilled 2. Encourage improved lifestyle - low sodium diet, regular exercise 3. Continue monitor BP outside office, bring readings to next visit, if persistently >140/90 or new symptoms notify office sooner 4. Follow-up q 6 months      Relevant Medications   losartan (COZAAR) 50 MG tablet   Hyperlipidemia associated with type 2 diabetes  mellitus ([place]  Controlled lipids on statin and lifestyle Fam history HyperTG Last lipid panel 07/2017 Calculated ASCVD 10 yr risk score >16%  Plan: 1. Continue Rosuvastatin 65m66mghtly 2. Continue ASA 81mg92m primary ASCVD risk reduction - reviewed new concerns w/ ASA regarding bleeding recommendations, agree to continue for now future if inc age and bleed risk can reconsider 3. Encourage improved lifestyle - low carb/cholesterol, reduce portion size, continue improving regular exercise 4. Lipids in 1 year for annual      Relevant Medications   losartan (COZAAR) 50 MG tablet   Obesity (BMI 30.0-34.9)    Improved weight loss down to 156 lbs, approx down 6-7 lbs in 10 months by our scales Encourage continued improve diet and exercise wt loss, DM control       Other Visit Diagnoses    Annual physical exam    -  Primary Updated health maintenance, due for Pneumovax-23 today, given, now completed series Review lab results Encourage improve diet / exercise lifestyle, continued wt loss     Need for 23-polyvalent pneumococcal polysaccharide vaccine       Relevant Orders   Pneumococcal polysaccharide vaccine 23-valent greater than or equal to 2yo subcutaneous/IM (Completed)      Meds ordered this encounter  Medications  . losartan (COZAAR) 50 MG tablet    Sig: TAKE 1 TABLET(50 MG) BY MOUTH DAILY    Dispense:  90 tablet    Refill:  3     Follow up plan: Return in about 6 months (around 02/09/2018) for 6 mo f/u DM A1c, HTN.  [name], [facility] 08/10/2017, 10:19 PM

## 2017-08-10 NOTE — Assessment & Plan Note (Signed)
Well-controlled HTN Home readings infrequent but controlled No known complications  Failed: Lisinopril (ACEi cough)    Plan:  1. Continue current BP regimen Losartan 50mg  daily - refilled 2. Encourage improved lifestyle - low sodium diet, regular exercise 3. Continue monitor BP outside office, bring readings to next visit, if persistently >140/90 or new symptoms notify office sooner 4. Follow-up q 6 months

## 2017-08-10 NOTE — Patient Instructions (Addendum)
Thank you for coming to the office today.  Pneumovax-23 today - now you are good forever* for pneumonia vaccines  Keep up the good work in general  Blood sugar and pressure well controlled  No changes to meds - sent a refill Losartan 90 day to [facility]  Please schedule a Follow-up Appointment to: Return in about 6 months (around 02/09/2018) for 6 mo f/u DM A1c, HTN.  If you have any other questions or concerns, please feel free to call the office or send a message through [place]. You may also schedule an earlier appointment if necessary.  Additionally, you may be receiving a survey about your experience at our office within a few days to 1 week by e-mail or mail. We value your feedback.  [name], DO [place]

## 2017-08-10 NOTE — Assessment & Plan Note (Signed)
Controlled lipids on statin and lifestyle Fam history HyperTG Last lipid panel 07/2017 Calculated ASCVD 10 yr risk score >16%  Plan: 1. Continue Rosuvastatin 10mg  nightly 2. Continue ASA 81mg  for primary ASCVD risk reduction - reviewed new concerns w/ ASA regarding bleeding recommendations, agree to continue for now future if inc age and bleed risk can reconsider 3. Encourage improved lifestyle - low carb/cholesterol, reduce portion size, continue improving regular exercise 4. Lipids in 1 year for annual

## 2017-08-11 ENCOUNTER — Encounter: Payer: Medicare Other | Admitting: Family Medicine

## 2017-10-07 ENCOUNTER — Other Ambulatory Visit: Payer: Self-pay | Admitting: Family Medicine

## 2017-10-07 DIAGNOSIS — IMO0001 Reserved for inherently not codable concepts without codable children: Secondary | ICD-10-CM

## 2017-10-07 DIAGNOSIS — E1165 Type 2 diabetes mellitus with hyperglycemia: Principal | ICD-10-CM

## 2017-10-29 ENCOUNTER — Telehealth: Payer: Self-pay

## 2017-10-29 NOTE — Telephone Encounter (Signed)
Called to reschedule awv. Left message to call back

## 2017-12-29 ENCOUNTER — Other Ambulatory Visit: Payer: Self-pay

## 2017-12-29 NOTE — Patient Outreach (Signed)
[facility]) Care Management  12/29/2017  Andrea Dodson 1950-12-07 [id number]   Medication Adherence call to Mr. Andrea Dodson left a message for patient to call back patient is due on Losartan 50 mg and Metformin 500 mg. Andrea Dodson is showing past due under [place].  [place] [facility] Direct Dial [phone]  Fax [phone] [email or web address]

## 2018-02-09 ENCOUNTER — Ambulatory Visit
Admission: RE | Admit: 2018-02-09 | Discharge: 2018-02-09 | Disposition: A | Discharge status: Home / Self Care | Payer: Medicare Other | Source: Ambulatory Visit | Attending: Family Medicine | Admitting: Family Medicine

## 2018-02-09 ENCOUNTER — Encounter: Payer: Self-pay | Admitting: Family Medicine

## 2018-02-09 ENCOUNTER — Other Ambulatory Visit: Payer: Self-pay | Admitting: Family Medicine

## 2018-02-09 ENCOUNTER — Ambulatory Visit (INDEPENDENT_AMBULATORY_CARE_PROVIDER_SITE_OTHER): Payer: Medicare Other | Admitting: Family Medicine

## 2018-02-09 ENCOUNTER — Ambulatory Visit (INDEPENDENT_AMBULATORY_CARE_PROVIDER_SITE_OTHER): Payer: Medicare Other

## 2018-02-09 VITALS — BP 119/53 | HR 73 | Temp 98.1°F | Ht 60.0 in | Wt 157.0 lb

## 2018-02-09 VITALS — BP 119/53 | HR 73 | Temp 98.1°F | Resp 16 | Ht 60.0 in | Wt 157.0 lb

## 2018-02-09 DIAGNOSIS — Z23 Encounter for immunization: Secondary | ICD-10-CM

## 2018-02-09 DIAGNOSIS — I10 Essential (primary) hypertension: Secondary | ICD-10-CM

## 2018-02-09 DIAGNOSIS — E669 Obesity, unspecified: Secondary | ICD-10-CM

## 2018-02-09 DIAGNOSIS — M1711 Unilateral primary osteoarthritis, right knee: Secondary | ICD-10-CM | POA: Insufficient documentation

## 2018-02-09 DIAGNOSIS — E785 Hyperlipidemia, unspecified: Secondary | ICD-10-CM

## 2018-02-09 DIAGNOSIS — E1169 Type 2 diabetes mellitus with other specified complication: Secondary | ICD-10-CM

## 2018-02-09 DIAGNOSIS — M25561 Pain in right knee: Secondary | ICD-10-CM | POA: Insufficient documentation

## 2018-02-09 DIAGNOSIS — Z780 Asymptomatic menopausal state: Secondary | ICD-10-CM | POA: Diagnosis not present

## 2018-02-09 DIAGNOSIS — Z0000 Encounter for general adult medical examination without abnormal findings: Secondary | ICD-10-CM | POA: Diagnosis not present

## 2018-02-09 LAB — POCT GLYCOSYLATED HEMOGLOBIN (HGB A1C): HEMOGLOBIN A1C: 6.9 % — AB (ref 4.0–5.6)

## 2018-02-09 MED ORDER — MELOXICAM 15 MG PO TABS: 7.5000 mg | ORAL_TABLET | Freq: Every day | ORAL | 0 refills | Status: DC | PRN

## 2018-02-09 NOTE — Assessment & Plan Note (Signed)
Subacute on chronic R medial vs generalized Knee pain without swelling without known injury or trauma  Suspected likely due to underlying osteoarthritis / DJD with known OA/DJD in other joints. Seems less likely meniscus but possible degenerative problem secondary - Able to bear weight, no knee instability, mechanical locking - LIMITED ROM - No prior history of knee surgery, arthroscopy - Inadequate conservative therapy   Plan: 1. Check X-ray R knee today for further diagnostic - Start anti-inflammatory trial with rx Meloxicam 15mg  daily wc x 1-2 weeks, then PRN 2. Start Tylenol 500-1000mg  per dose TID PRN breakthrough - RICE therapy (rest, ice, compression, elevation) for swelling, activity modification 3. Follow-up as needed sooner within few weeks / months - may consider PT refer, injection, vs Ortho refer - future may need MRI

## 2018-02-09 NOTE — Progress Notes (Signed)
Subjective:    Patient ID: Andrea Dodson, female    DOB: 1950-12-09, 67 y.o.   MRN: [id number]  Andrea Dodson is a 67 y.o. female presenting on 02/09/2018 for Diabetes   HPI   Patient is accompanied by sister, Maia Plan. Also patient has completed AMW today with [facility] LPN, see separate documentation.  CHRONIC DM, Type 2 / OBESITY BMI >30 Reports no new concerns, doing well some recent not following diet as much. A1c up from 6.6 up to 6.9 CBGs:improved readings Meds: Metformin 500mg  BID Reportsimprovedcompliance. Tolerating well w/o side-effects CurrentlyARB/ASA daily Lifestyle: - Weight stable without gain/loss - Diet: recently not following as well, but overall still improved DM diet - Exercise (increasing activity, walking) - Last DM Eye Exam completed - [facility] 02/04/17 - next due 01/2018 - she will schedule Denies hypoglycemia  CHRONIC HTN: Reports no new concerns. She is checking BP at home regularly, seems to be controlled, avg 120-130s / 70-80s Current Meds - Losartan 50mg  daily - Prior history w/ ACEi cough on Lisinopril Reports good compliance, took meds today. Tolerating well, w/o complaints. Denies CP, dyspnea, HA, edema, dizziness / lightheadedness  Chronic R Knee Pain / Osteoarthritis Has long history of R knee pain and previous dx of arthritis, but no imaging or recent imaging or other treatment, last seen by me 06/2016 for same problem, advised conservative care. - Today now she reports gradual worsening R knee pain, worse after started working at [employer], she is on her feet for a long time now, her R knee is causing some burning discomfort, she wears a brace with some relief, wakes up at night with some pain if tries to straighten it. - Taking Tylenol PRN - stopped taking this bc not working, was not taking higher doses though - Takes Ibuprofen occasionally - 200mg  x 2 for one dose PRN rarely, she tries to avoid - Using a brace with  some relief - Never had injection or surgery or other treatment Not followed by Ortho Denies any fall injury twist instability other joint pain or swelling or redness  Sebaceous Cyst, scalp - Reports chronic recurrent episodes, has had them drained and removed in past. Usually only on scalp. She has not had issues with them but has requested removal in past. Not ready now, has not tried to drain these on her own prefers to wait. Denies fever chills other skin involvement redness pain drainage  Health Maintenance: Due for Flu Shot, will receive today   Also DEXA was ordered at [facility]  Depression screen [facility] 2/9 02/09/2018 08/10/2017 01/20/2017  Decreased Interest 0 0 0  Down, Depressed, Hopeless 0 0 0  PHQ - 2 Score 0 0 0    Social History   Tobacco Use  . Smoking status: Never Smoker  . Smokeless tobacco: Never Used  Substance Use Topics  . Alcohol use: No  . Drug use: No    Review of Systems Per HPI unless specifically indicated above     Objective:    BP (!) 119/53   Pulse 73   Temp 98.1 F (36.7 C) (Oral)   Resp 16   Ht 5' (1.524 m)   Wt 157 lb (71.2 kg)   BMI 30.66 kg/m   Wt Readings from Last 3 Encounters:  02/09/18 157 lb (71.2 kg)  02/09/18 157 lb (71.2 kg)  08/10/17 156 lb (70.8 kg)    Physical Exam  Constitutional: She is oriented to person, place, and time. She appears well-developed  and well-nourished. No distress.  Well-appearing, comfortable, cooperative  HENT:  Head: Normocephalic and atraumatic.  Mouth/Throat: Oropharynx is clear and moist.  Eyes: Conjunctivae are normal. Right eye exhibits no discharge. Left eye exhibits no discharge.  Neck: Normal range of motion. Neck supple. No thyromegaly present.  Cardiovascular: Normal rate, regular rhythm, normal heart sounds and intact distal pulses.  No murmur heard. Pulmonary/Chest: Effort normal and breath sounds normal. No respiratory distress. She has no wheezes. She has no rales.  Musculoskeletal:  She exhibits no edema.  Bilateral Knees Inspection: Normal appearance and symmetrical. No ecchymosis or effusion. Palpation: R Knee mild tender with increased tender medial aspect joint line. Very significant audible and palpable crepitus R knee, L knee is mild crepitus ROM: Reduced R knee active knee extension, mostly intact flexion but still limited, L knee has preserved ROM Special Testing: Lachman / Valgus/Varus tests negative with intact ligaments (ACL, MCL, LCL). Standing Thessaly with pain R knee medial rotation but still stable without instability or severe pain, negative lateral. Strength: 5/5 intact knee flex/ext, ankle dorsi/plantarflex Neurovascular: distally intact sensation light touch and pulses   Lymphadenopathy:    She has no cervical adenopathy.  Neurological: She is alert and oriented to person, place, and time.  Skin: Skin is warm and dry. No rash noted. She is not diaphoretic. No erythema.  Two distinct moderate to large sized sebaceous cysts on scalp, one left mid and other R posterior oriented. About 1.5 to 2 cm or less, soft but slightly fluctuant, without erythema or induration, non tender. No open sore or drainage.  Psychiatric: She has a normal mood and affect. Her behavior is normal.  Well groomed, good eye contact, normal speech and thoughts  Nursing note and vitals reviewed.    Diabetic Foot Exam - Simple   Simple Foot Form Diabetic Foot exam was performed with the following findings:  Yes 02/09/2018  9:58 AM  Visual Inspection No deformities, no ulcerations, no other skin breakdown bilaterally:  Yes Sensation Testing Intact to touch and monofilament testing bilaterally:  Yes Pulse Check Posterior Tibialis and Dorsalis pulse intact bilaterally:  Yes Comments    I have personally reviewed the radiology report from R knee X-ray on 02/09/18.  CLINICAL DATA:  Right knee pain for 2 years  EXAM: RIGHT KNEE - COMPLETE 4+ VIEW  COMPARISON:   None.  FINDINGS: Generalized osteopenia. No acute fracture or dislocation. Moderate right medial femorotibial compartment joint space narrowing and marginal osteophytes. Small marginal osteophytes of the patellofemoral compartment and lateral femorotibial compartment. No significant joint effusion.  IMPRESSION: 1.  No acute osseous injury of the right knee. 2. Moderate osteoarthritis of the right medial femorotibial compartment.   Electronically Signed   By: Kathreen Devoid   On: 02/09/2018 16:21   Results for orders placed or performed in visit on 02/09/18  POCT HgB A1C  Result Value Ref Range   Hemoglobin A1C 6.9 (A) 4.0 - 5.6 %      Assessment & Plan:   Problem List Items Addressed This Visit    Essential hypertension    Well-controlled HTN Home readings infrequent but controlled No known complications  Failed: Lisinopril (ACEi cough)    Plan:  1. Continue current BP regimen Losartan 50mg  daily 2. Encourage improved lifestyle - low sodium diet, regular exercise 3. Continue monitor BP outside office, bring readings to next visit, if persistently >140/90 or new symptoms notify office sooner 4. Follow-up q 6 months      Obesity (BMI  30.0-34.9)    Weight stable Improve diet as discussed      Osteoarthritis of right knee    Subacute on chronic R medial vs generalized Knee pain without swelling without known injury or trauma  Suspected likely due to underlying osteoarthritis / DJD with known OA/DJD in other joints. Seems less likely meniscus but possible degenerative problem secondary - Able to bear weight, no knee instability, mechanical locking - LIMITED ROM - No prior history of knee surgery, arthroscopy - Inadequate conservative therapy   Plan: 1. Check X-ray R knee today for further diagnostic - Start anti-inflammatory trial with rx Meloxicam 15mg  daily wc x 1-2 weeks, then PRN 2. Start Tylenol 500-1000mg  per dose TID PRN breakthrough - RICE therapy  (rest, ice, compression, elevation) for swelling, activity modification 3. Follow-up as needed sooner within few weeks / months - may consider PT refer, injection, vs Ortho refer - future may need MRI  Additional update after visit today - reviewed X-rays and contacted patient on MyChart with results - showed moderate medial compartment R knee osteoarthritis, most likely cause of symptoms, keep current plan, follow-up sooner if not improving as expected consider injection, referral - still cannot rule out meniscus or soft tissue may warrant further treatment        Relevant Medications   meloxicam (MOBIC) 15 MG tablet   Other Relevant Orders   DG Knee Complete 4 Views Right   Type 2 diabetes mellitus with other specified complication ([place]) - Primary    Slightly elevated A1c from 6.6 up to 6.9 Overall still at goal, controlled No significant hyperglycemia No hypoglycemia Complications - other including hyperlipidemia, obesity - increases risk of future cardiovascular complications   Plan:  1. Continue current therapy - Metformin 500mg  BID 2. Encourage improved lifestyle - low carb, low sugar diet, reduce portion size, continue improving regular exercise 3. Check CBG, bring log to next visit for review 4. Continue ASA, ARB, Statin 5. Completed DM Foot - due for diabetes eye exam - [facility] 01/2018 6. Follow-up now q 6 months - annual phys w/ labs      Relevant Orders   POCT HgB A1C (Completed)    Other Visit Diagnoses    Needs flu shot       Relevant Orders   Flu vaccine HIGH DOSE PF (Fluzone High dose) (Completed)      Meds ordered this encounter  Medications  . meloxicam (MOBIC) 15 MG tablet    Sig: Take 0.5-1 tablets (7.5-15 mg total) by mouth daily as needed for pain.    Dispense:  30 tablet    Refill:  0    For up to 1-2 weeks at a time then as needed   Follow up plan: Return in about 6 months (around 08/11/2018) for Annual Physical.  Future labs ordered for  08/05/18  [name], DO [facility] 02/09/2018, 12:30 PM

## 2018-02-09 NOTE — Patient Instructions (Addendum)
Thank you for coming to the office today.  A1c 6.9 - slightly elevated, overall still at goal, keep up the good work and continue improving  Please schedule Annual Diabetic Eye Exam w/ [facility] - have them send [place] a copy of the report.  Flu shot today  X-ray today - stay tuned for results.  Recommend trial of Anti-inflammatory with Meloxicam 15mg  tabs - take HALF or one with food and plenty of water daily IF NEEDED for up to 1 to 2 weeks then as needed - DO NOT TAKE any ibuprofen, aleve, motrin while you are taking this medicine - It is safe to take Tylenol Ext Str 500mg  tabs - take 1 to 2 (max dose 1000mg ) every 6 hours as needed for breakthrough pain, max 24 hour daily dose is 6 to 8 tablets or 4000mg   If needed, we can return to discuss the R knee and consider injection or other options or referral to Ortho / Physical Therapy  DUE for FASTING BLOOD WORK (no food or drink after midnight before the lab appointment, only water or coffee without cream/sugar on the morning of)  SCHEDULE "Lab Only" visit in the morning at the clinic for lab draw in 6 MONTHS   - Make sure Lab Only appointment is at about 1 week before your next appointment, so that results will be available  For Lab Results, once available within 2-3 days of blood draw, you can can log in to MyChart online to view your results and a brief explanation. Also, we can discuss results at next follow-up visit.   Please schedule a Follow-up Appointment to: Return in about 6 months (around 08/11/2018) for Annual Physical.  If you have any other questions or concerns, please feel free to call the office or send a message through [place]. You may also schedule an earlier appointment if necessary.  Additionally, you may be receiving a survey about your experience at our office within a few days to 1 week by e-mail or mail. We value your feedback.  [name], DO [place]

## 2018-02-09 NOTE — Assessment & Plan Note (Signed)
Well-controlled HTN Home readings infrequent but controlled No known complications  Failed: Lisinopril (ACEi cough)    Plan:  1. Continue current BP regimen Losartan 50mg daily 2. Encourage improved lifestyle - low sodium diet, regular exercise 3. Continue monitor BP outside office, bring readings to next visit, if persistently >140/90 or new symptoms notify office sooner 4. Follow-up q 6 months 

## 2018-02-09 NOTE — Patient Instructions (Addendum)
Andrea Dodson , Thank you for taking time to come for your Medicare Wellness Visit. I appreciate your ongoing commitment to your health goals. Please review the following plan we discussed and let me know if I can assist you in the future.   Screening recommendations/referrals: Colonoscopy: cologuard completed 07/21/2016 Mammogram: completed 03/17/2017 Bone Density: Please call [phone] to schedule.  Recommended yearly ophthalmology/optometry visit for glaucoma screening and checkup Recommended yearly dental visit for hygiene and checkup  Vaccinations: Influenza vaccine: done today  Pneumococcal vaccine: completed series  Tdap vaccine: up to date  Shingles vaccine: shingrix eligible, check with your insurance company for coverage   Advanced directives: Advance directive discussed with you today. I have provided a copy for you to complete at home and have notarized. Once this is complete please bring a copy in to our office so we can scan it into your chart.  Conditions/risks identified: Recommend drinking at least 6-8 glasses of water a day   Next appointment: follow up in one year for your annual wellness exam.    Preventive Care 65 Years and Older, Female Preventive care refers to lifestyle choices and visits with your health care provider that can promote health and wellness. What does preventive care include?  A yearly physical exam. This is also called an annual well check.  Dental exams once or twice a year.  Routine eye exams. Ask your health care provider how often you should have your eyes checked.  Personal lifestyle choices, including:  Daily care of your teeth and gums.  Regular physical activity.  Eating a healthy diet.  Avoiding tobacco and drug use.  Limiting alcohol use.  Practicing safe sex.  Taking low-dose aspirin every day.  Taking vitamin and mineral supplements as recommended by your health care provider. What happens during an annual well  check? The services and screenings done by your health care provider during your annual well check will depend on your age, overall health, lifestyle risk factors, and family history of disease. Counseling  Your health care provider may ask you questions about your:  Alcohol use.  Tobacco use.  Drug use.  Emotional well-being.  Home and relationship well-being.  Sexual activity.  Eating habits.  History of falls.  Memory and ability to understand (cognition).  Work and work Statistician.  Reproductive health. Screening  You may have the following tests or measurements:  Height, weight, and BMI.  Blood pressure.  Lipid and cholesterol levels. These may be checked every 5 years, or more frequently if you are over 31 years old.  Skin check.  Lung cancer screening. You may have this screening every year starting at age 22 if you have a 30-pack-year history of smoking and currently smoke or have quit within the past 15 years.  Fecal occult blood test (FOBT) of the stool. You may have this test every year starting at age 13.  Flexible sigmoidoscopy or colonoscopy. You may have a sigmoidoscopy every 5 years or a colonoscopy every 10 years starting at age 53.  Hepatitis C blood test.  Hepatitis B blood test.  Sexually transmitted disease (STD) testing.  Diabetes screening. This is done by checking your blood sugar (glucose) after you have not eaten for a while (fasting). You may have this done every 1-3 years.  Bone density scan. This is done to screen for osteoporosis. You may have this done starting at age 17.  Mammogram. This may be done every 1-2 years. Talk to your health care provider about how  often you should have regular mammograms. Talk with your health care provider about your test results, treatment options, and if necessary, the need for more tests. Vaccines  Your health care provider may recommend certain vaccines, such as:  Influenza vaccine. This is  recommended every year.  Tetanus, diphtheria, and acellular pertussis (Tdap, Td) vaccine. You may need a Td booster every 10 years.  Zoster vaccine. You may need this after age 85.  Pneumococcal 13-valent conjugate (PCV13) vaccine. One dose is recommended after age 102.  Pneumococcal polysaccharide (PPSV23) vaccine. One dose is recommended after age 57. Talk to your health care provider about which screenings and vaccines you need and how often you need them. This information is not intended to replace advice given to you by your health care provider. Make sure you discuss any questions you have with your health care provider. Document Released: 05/04/2015 Document Revised: 12/26/2015 Document Reviewed: 02/06/2015 Elsevier Interactive Patient Education  2017 [place] Prevention in the Home Falls can cause injuries. They can happen to people of all ages. There are many things you can do to make your home safe and to help prevent falls. What can I do on the outside of my home?  Regularly fix the edges of walkways and driveways and fix any cracks.  Remove anything that might make you trip as you walk through a door, such as a raised step or threshold.  Trim any bushes or trees on the path to your home.  Use bright outdoor lighting.  Clear any walking paths of anything that might make someone trip, such as rocks or tools.  Regularly check to see if handrails are loose or broken. Make sure that both sides of any steps have handrails.  Any raised decks and porches should have guardrails on the edges.  Have any leaves, snow, or ice cleared regularly.  Use sand or salt on walking paths during winter.  Clean up any spills in your garage right away. This includes oil or grease spills. What can I do in the bathroom?  Use night lights.  Install grab bars by the toilet and in the tub and shower. Do not use towel bars as grab bars.  Use non-skid mats or decals in the tub or  shower.  If you need to sit down in the shower, use a plastic, non-slip stool.  Keep the floor dry. Clean up any water that spills on the floor as soon as it happens.  Remove soap buildup in the tub or shower regularly.  Attach bath mats securely with double-sided non-slip rug tape.  Do not have throw rugs and other things on the floor that can make you trip. What can I do in the bedroom?  Use night lights.  Make sure that you have a light by your bed that is easy to reach.  Do not use any sheets or blankets that are too big for your bed. They should not hang down onto the floor.  Have a firm chair that has side arms. You can use this for support while you get dressed.  Do not have throw rugs and other things on the floor that can make you trip. What can I do in the kitchen?  Clean up any spills right away.  Avoid walking on wet floors.  Keep items that you use a lot in easy-to-reach places.  If you need to reach something above you, use a strong step stool that has a grab bar.  Keep electrical  cords out of the way.  Do not use floor polish or wax that makes floors slippery. If you must use wax, use non-skid floor wax.  Do not have throw rugs and other things on the floor that can make you trip. What can I do with my stairs?  Do not leave any items on the stairs.  Make sure that there are handrails on both sides of the stairs and use them. Fix handrails that are broken or loose. Make sure that handrails are as long as the stairways.  Check any carpeting to make sure that it is firmly attached to the stairs. Fix any carpet that is loose or worn.  Avoid having throw rugs at the top or bottom of the stairs. If you do have throw rugs, attach them to the floor with carpet tape.  Make sure that you have a light switch at the top of the stairs and the bottom of the stairs. If you do not have them, ask someone to add them for you. What else can I do to help prevent  falls?  Wear shoes that:  Do not have high heels.  Have rubber bottoms.  Are comfortable and fit you well.  Are closed at the toe. Do not wear sandals.  If you use a stepladder:  Make sure that it is fully opened. Do not climb a closed stepladder.  Make sure that both sides of the stepladder are locked into place.  Ask someone to hold it for you, if possible.  Clearly mark and make sure that you can see:  Any grab bars or handrails.  First and last steps.  Where the edge of each step is.  Use tools that help you move around (mobility aids) if they are needed. These include:  Canes.  Walkers.  Scooters.  Crutches.  Turn on the lights when you go into a dark area. Replace any light bulbs as soon as they burn out.  Set up your furniture so you have a clear path. Avoid moving your furniture around.  If any of your floors are uneven, fix them.  If there are any pets around you, be aware of where they are.  Review your medicines with your doctor. Some medicines can make you feel dizzy. This can increase your chance of falling. Ask your doctor what other things that you can do to help prevent falls. This information is not intended to replace advice given to you by your health care provider. Make sure you discuss any questions you have with your health care provider. Document Released: 02/01/2009 Document Revised: 09/13/2015 Document Reviewed: 05/12/2014 Elsevier Interactive Patient Education  2017 [employer].

## 2018-02-09 NOTE — Assessment & Plan Note (Signed)
Weight stable Improve diet as discussed

## 2018-02-09 NOTE — Progress Notes (Signed)
Subjective:   Andrea Dodson is a 67 y.o. female who presents for an Initial Medicare Annual Wellness Visit.  Review of Systems    n/a  Cardiac Risk Factors include: advanced age (>39mn, >>57women);diabetes mellitus;hypertension;obesity (BMI >30kg/m2);dyslipidemia     Objective:    Today's Vitals   02/09/18 1016  BP: (!) 119/53  Pulse: 73  Temp: 98.1 F (36.7 C)  TempSrc: Oral  Weight: 157 lb (71.2 kg)  Height: 5' (1.524 m)   Body mass index is 30.66 kg/m.  Advanced Directives 02/09/2018  Does Patient Have a Medical Advance Directive? No  Does patient want to make changes to medical advance directive? Yes (MAU/Ambulatory/Procedural Areas - Information given)    Current Medications (verified) Outpatient Encounter Medications as of 02/09/2018  Medication Sig  . aspirin EC 81 MG tablet Take 1 tablet (81 mg total) by mouth daily.  . Blood Glucose Monitoring Suppl (ONE TOUCH ULTRA 2) w/Device KIT 1 device for checking blood sugar once daily  . fluticasone (FLONASE) 50 MCG/ACT nasal spray Place 2 sprays into both nostrils daily.  .Marland Kitchenglucose blood (ONE TOUCH ULTRA TEST) test strip CHECK BLOOD SUGAR ONCE DAILY  . Krill Oil 350 MG CAPS Take 350 mg by mouth daily.  . Lancets (ONETOUCH ULTRASOFT) lancets Use as instructed  . loratadine (CLARITIN) 10 MG tablet Take 1 tablet (10 mg total) by mouth daily.  .Marland Kitchenlosartan (COZAAR) 50 MG tablet TAKE 1 TABLET(50 MG) BY MOUTH DAILY  . meloxicam (MOBIC) 15 MG tablet Take 0.5-1 tablets (7.5-15 mg total) by mouth daily as needed for pain.  . metFORMIN (GLUCOPHAGE) 500 MG tablet TAKE 1 TABLET BY MOUTH TWO  TIMES DAILY WITH A MEAL  . rosuvastatin (CRESTOR) 10 MG tablet Take 1 tablet (10 mg total) by mouth daily.   No facility-administered encounter medications on file as of 02/09/2018.     Allergies (verified) Lisinopril   History: Past Medical History:  Diagnosis Date  . Allergy   . Arthritis   . Urinary incontinence    Past  Surgical History:  Procedure Laterality Date  . APPENDECTOMY    . CHOLECYSTECTOMY  1993  . TONSILLECTOMY     Family History  Problem Relation Age of Onset  . Heart failure Mother   . Heart disease Mother   . Diabetes Mother   . Heart attack Mother   . Cancer Father        lung  . Heart attack Sister   . Diabetes Sister   . Breast cancer Sister 61  Social History   Socioeconomic History  . Marital status: Widowed    Spouse name: Not on file  . Number of children: Not on file  . Years of education: Not on file  . Highest education level: Some college, no degree  Occupational History  . Occupation: wPaediatric nurse   Comment: full time   Social Needs  . Financial resource strain: Not hard at all  . Food insecurity:    Worry: Never true    Inability: Never true  . Transportation needs:    Medical: No    Non-medical: No  Tobacco Use  . Smoking status: Never Smoker  . Smokeless tobacco: Never Used  Substance and Sexual Activity  . Alcohol use: No  . Drug use: No  . Sexual activity: Not on file  Lifestyle  . Physical activity:    Days per week: 0 days    Minutes per session: 0 min  . Stress: Not  at all  Relationships  . Social connections:    Talks on phone: More than three times a week    Gets together: More than three times a week    Attends religious service: Never    Active member of club or organization: No    Attends meetings of clubs or organizations: Never    Relationship status: Widowed  Other Topics Concern  . Not on file  Social History Narrative   Working full time     Tobacco Counseling Counseling given: Not Answered   Clinical Intake:  Pre-visit preparation completed: Yes  Pain : No/denies pain     Nutritional Status: BMI > 30  Obese Nutritional Risks: None Diabetes: Yes CBG done?: No Did pt. bring in CBG monitor from home?: No  Nutrition Risk Assessment:  Has the patient had any N/V/D within the last 2 months?  No  Does the patient  have any non-healing wounds?  No  Has the patient had any unintentional weight loss or weight gain?  No   Diabetes:  Is the patient diabetic?  Yes  If diabetic, was a CBG obtained today?  No  Did the patient bring in their glucometer from home?  No  How often do you monitor your CBG's? Couple times a week   Financial Strains and Diabetes Management:  Are you having any financial strains with the device, your supplies or your medication? No .   Would the patient like to be referred to a Nutritionist or for Diabetic Management?  No   Diabetic Exams:  Diabetic Eye Exam: Completed 02/04/2017. Overdue for diabetic eye exam. Patient will call [facility] and schedule an appt. Patient will have them fax results over when completed.   Diabetic Foot Exam: Completed today.   How often do you need to have someone help you when you read instructions, pamphlets, or other written materials from your doctor or pharmacy?: 1 - Never What is the last grade level you completed in school?: some college   Interpreter Needed?: No  Information entered by :: Si Jachim,LPN    Activities of Daily Living In your present state of health, do you have any difficulty performing the following activities: 02/09/2018 08/10/2017  Hearing? N N  Comment declines hearing aids  -  Vision? N N  Comment wears glasses -  Difficulty concentrating or making decisions? N N  Walking or climbing stairs? N N  Dressing or bathing? N N  Doing errands, shopping? N N  Preparing Food and eating ? N -  Using the Toilet? N -  In the past six months, have you accidently leaked urine? Y -  Comment wears pads for protection  -  Do you have problems with loss of bowel control? N -  Managing your Medications? N -  Managing your Finances? N -  Housekeeping or managing your Housekeeping? N -  Some recent data might be hidden     Immunizations and Health Maintenance Immunization History  Administered Date(s)  Administered  . Influenza, High Dose Seasonal PF 12/30/2016, 02/09/2018  . Pneumococcal Conjugate-13 07/11/2016  . Pneumococcal Polysaccharide-23 08/10/2017  . Tdap 07/11/2016   Health Maintenance Due  Topic Date Due  . FOOT EXAM  10/17/2017  . HEMOGLOBIN A1C  02/03/2018  . OPHTHALMOLOGY EXAM  02/04/2018    Patient Care Team: Olin Hauser, DO as PCP - General (Family Medicine)  Indicate any recent Medical Services you may have received from other than [facility] providers in the  past year (date may be approximate).     Assessment:   This is a routine wellness examination for Andrea Dodson.  Hearing/Vision screen Vision Screening Comments: [facility]es [facility]   Dietary issues and exercise activities discussed: Current Exercise Habits: The patient does not participate in regular exercise at present, Exercise limited by: None identified  Goals    . DIET - INCREASE WATER INTAKE     Recommend drinking at least 6-8 glasses of water a day       Depression Screen PHQ 2/9 Scores 02/09/2018 08/10/2017 01/20/2017 07/11/2016  PHQ - 2 Score 0 0 0 0    Fall Risk Fall Risk  02/09/2018 08/10/2017 01/20/2017 07/11/2016  Falls in the past year? No No No No    FALL RISK PREVENTION PERTAINING TO THE HOME:  Any stairs in or around the home WITH handrails? Yes  Home free of loose throw rugs in walkways, pet beds, electrical cords, etc? Yes  Adequate lighting in your home to reduce risk of falls? Yes   ASSISTIVE DEVICES UTILIZED TO PREVENT FALLS:  Life alert? No  Use of a cane, walker or w/c? No  Grab bars in the bathroom? No  Shower chair or bench in shower? No  Elevated toilet seat or a handicapped toilet? No   DME ORDERS:  DME order needed?  No   TIMED UP AND GO:  Was the test performed? Yes .  Length of time to ambulate 10 feet: 8 sec.   GAIT:  Appearance of gait: Gait stead-fast without the use of an assistive device.  Education: Fall risk prevention has been  discussed.  Intervention(s) required? No    Cognitive Function:     6CIT Screen 02/09/2018  What Year? 0 points  What month? 0 points  What time? 0 points  Count back from 20 0 points  Months in reverse 0 points  Repeat phrase 0 points  Total Score 0    Screening Tests Health Maintenance  Topic Date Due  . FOOT EXAM  10/17/2017  . HEMOGLOBIN A1C  02/03/2018  . OPHTHALMOLOGY EXAM  02/04/2018  . MAMMOGRAM  03/18/2019  . Fecal DNA (Cologuard)  07/22/2019  . TETANUS/TDAP  07/12/2026  . INFLUENZA VACCINE  Completed  . DEXA SCAN  Completed  . Hepatitis C Screening  Completed  . PNA vac Low Risk Adult  Completed    Qualifies for Shingles Vaccine? Yes  Due for Shingrix. Education has been provided regarding the importance of this vaccine. Pt has been advised to call insurance company to determine out of pocket expense. Advised may also receive vaccine at local pharmacy or Health Dept. Verbalized acceptance and understanding.  Tdap: up to date   Flu Vaccine: Due for Flu vaccine. Does the patient want to receive this vaccine today?  Yes . Done today   Pneumococcal Vaccine: completed series   Cancer Screenings:  Colorectal Screening: cologuard completed 07/21/2016  Mammogram: Completed 03/17/2017. Repeat every 2 years  Bone Density: ordered.   Lung Cancer Screening: (Low Dose CT Chest recommended if Age 40-80 years, 30 pack-year currently smoking OR have quit w/in 15years.) does not qualify.    Additional Screening:  Hepatitis C Screening: does qualify; Completed 07/15/2016   Dental Screening: Recommended annual dental exams for proper oral hygiene  Community Resource Referral:  CRR required this visit?  No     Plan:    I have personally reviewed and addressed the Medicare Annual Wellness questionnaire and have noted the following in  the patient's chart:  A. Medical and social history B. Use of alcohol, tobacco or illicit drugs  C. Current medications and  supplements D. Functional ability and status E.  Nutritional status F.  Physical activity G. Advance directives H. List of other physicians I.  Hospitalizations, surgeries, and ER visits in previous 12 months J.  [place] such as hearing and vision if needed, cognitive and depression L. Referrals and appointments   In addition, I have reviewed and discussed with patient certain preventive protocols, quality metrics, and best practice recommendations. A written personalized care plan for preventive services as well as general preventive health recommendations were provided to patient.   Signed,  Tyler Aas, LPN Nurse Health Advisor   Nurse Notes:none

## 2018-02-09 NOTE — Assessment & Plan Note (Signed)
Slightly elevated A1c from 6.6 up to 6.9 Overall still at goal, controlled No significant hyperglycemia No hypoglycemia Complications - other including hyperlipidemia, obesity - increases risk of future cardiovascular complications   Plan:  1. Continue current therapy - Metformin 500mg  BID 2. Encourage improved lifestyle - low carb, low sugar diet, reduce portion size, continue improving regular exercise 3. Check CBG, bring log to next visit for review 4. Continue ASA, ARB, Statin 5. Completed DM Foot - due for diabetes eye exam - [facility] 01/2018 6. Follow-up now q 6 months - annual phys w/ labs

## 2018-02-13 ENCOUNTER — Other Ambulatory Visit: Payer: Self-pay | Admitting: Family Medicine

## 2018-02-13 DIAGNOSIS — E1165 Type 2 diabetes mellitus with hyperglycemia: Principal | ICD-10-CM

## 2018-02-13 DIAGNOSIS — IMO0001 Reserved for inherently not codable concepts without codable children: Secondary | ICD-10-CM

## 2018-03-08 ENCOUNTER — Other Ambulatory Visit: Payer: Self-pay | Admitting: Family Medicine

## 2018-03-08 DIAGNOSIS — M1711 Unilateral primary osteoarthritis, right knee: Secondary | ICD-10-CM

## 2018-04-06 ENCOUNTER — Other Ambulatory Visit: Payer: Self-pay | Admitting: Family Medicine

## 2018-04-06 DIAGNOSIS — M1711 Unilateral primary osteoarthritis, right knee: Secondary | ICD-10-CM

## 2018-04-27 LAB — HEMOGLOBIN A1C: HEMOGLOBIN A1C: 6.5

## 2018-04-27 LAB — MICROALBUMIN, URINE: Microalb, Ur: 0

## 2018-05-11 LAB — FECAL OCCULT BLOOD, IMMUNOCHEMICAL: IMMUNOLOGICAL FECAL OCCULT BLOOD TEST: NEGATIVE

## 2018-05-25 ENCOUNTER — Other Ambulatory Visit: Payer: Self-pay

## 2018-05-25 ENCOUNTER — Ambulatory Visit (INDEPENDENT_AMBULATORY_CARE_PROVIDER_SITE_OTHER): Payer: Medicare Other | Admitting: Family Medicine

## 2018-05-25 ENCOUNTER — Encounter: Payer: Self-pay | Admitting: Family Medicine

## 2018-05-25 VITALS — BP 159/62 | HR 61 | Temp 97.9°F | Resp 15 | Ht 60.0 in | Wt 152.4 lb

## 2018-05-25 DIAGNOSIS — J209 Acute bronchitis, unspecified: Secondary | ICD-10-CM | POA: Diagnosis not present

## 2018-05-25 MED ORDER — GUAIFENESIN-CODEINE 100-10 MG/5ML PO SYRP: 5.0000 mL | ORAL_SOLUTION | Freq: Three times a day (TID) | ORAL | 0 refills | Status: DC | PRN

## 2018-05-25 MED ORDER — BENZONATATE 100 MG PO CAPS: 100.0000 mg | ORAL_CAPSULE | Freq: Three times a day (TID) | ORAL | 0 refills | Status: DC | PRN

## 2018-05-25 MED ORDER — AZITHROMYCIN 250 MG PO TABS: ORAL_TABLET | ORAL | 0 refills | Status: DC

## 2018-05-25 NOTE — Progress Notes (Signed)
Subjective:    Patient ID: Andrea Dodson, female    DOB: Jan 19, 1951, 68 y.o.   MRN: [id number]  Andrea Dodson is a 68 y.o. female presenting on 05/25/2018 for Cough (for 2 weeks, no chills, no fever or pain)  Patient presents for a same day appointment.  HPI   BRONCHITIS / COUGH Reports symptoms started with cough about 2 weeks ago, with some worsening productive cough, feels chest congestion and pressure, temporary relief at times with coughing only. She has clear sinuses currently and seems mostly cough is primary issue. - Tried Mucinex, NyQuil, OTC Diabetic Robitussin - with limited relief - No known sick contact. No known international travel or other exposures. - Denies fevers, chills, abdominal pain, nausea vomiting, diarrhea, sinus pressure and ear pain  Health Maintenance: UTD Flu vaccine 01/2018, and UTD PNA vaccine series.  Updated Fit Test negative, 05/11/18 - she already had updated negative Cologuard from 2018 on file  Depression screen [facility] 2/9 05/25/2018 02/09/2018 08/10/2017  Decreased Interest 0 0 0  Down, Depressed, Hopeless 0 0 0  PHQ - 2 Score 0 0 0    Social History   Tobacco Use  . Smoking status: Never Smoker  . Smokeless tobacco: Never Used  Substance Use Topics  . Alcohol use: No  . Drug use: No    Review of Systems Per HPI unless specifically indicated above     Objective:    BP (!) 159/62   Pulse 61   Temp 97.9 F (36.6 C) (Oral)   Resp 15   Ht 5' (1.524 m)   Wt 152 lb 6.4 oz (69.1 kg)   SpO2 99%   BMI 29.76 kg/m   Wt Readings from Last 3 Encounters:  05/25/18 152 lb 6.4 oz (69.1 kg)  02/09/18 157 lb (71.2 kg)  02/09/18 157 lb (71.2 kg)    Physical Exam Vitals signs and nursing note reviewed.  Constitutional:      General: She is not in acute distress.    Appearance: She is well-developed. She is not diaphoretic.     Comments: Well-appearing, comfortable, cooperative  HENT:     Head: Normocephalic and atraumatic.     Comments:  Frontal / maxillary sinuses non-tender. Nares patent without purulence or edema. Bilateral TMs clear without erythema, effusion or bulging. Oropharynx clear without erythema, exudates, edema or asymmetry. Eyes:     General:        Right eye: No discharge.        Left eye: No discharge.     Conjunctiva/sclera: Conjunctivae normal.  Neck:     Musculoskeletal: Normal range of motion and neck supple.     Thyroid: No thyromegaly.  Cardiovascular:     Rate and Rhythm: Normal rate and regular rhythm.     Heart sounds: Normal heart sounds. No murmur.  Pulmonary:     Effort: Pulmonary effort is normal. No respiratory distress.     Breath sounds: No rales.     Comments: Scattered end exp wheezes, rhonchi, clear with cough. No focal crackles. Speaks full sentences. Occasional coughing. Musculoskeletal: Normal range of motion.  Lymphadenopathy:     Cervical: No cervical adenopathy.  Skin:    General: Skin is warm and dry.     Findings: No erythema or rash.  Neurological:     Mental Status: She is alert and oriented to person, place, and time.  Psychiatric:        Behavior: Behavior normal.     Comments: Well groomed,  good eye contact, normal speech and thoughts        Assessment & Plan:   Problem List Items Addressed This Visit    None    Visit Diagnoses    Acute bronchitis, unspecified organism    -  Primary   Relevant Medications   azithromycin (ZITHROMAX Z-PAK) 250 MG tablet   benzonatate (TESSALON) 100 MG capsule   guaiFENesin-codeine (ROBITUSSIN AC) 100-10 MG/5ML syrup      Consistent with worsening bronchitis in setting of likely viral URI, also likely allergic rhinitis component. Concern with duration >1-2 week, recurrent episode - Not consistent with influenza - Afebrile, no focal signs of infection (not consistent with pneumonia by history or exam), no evidence sinusitis. Mild coarse sound vs scattered wheeze possible infectious bronchospasm induced. Past history of mild  asthma.   Plan: - Start Azithromycin Z-pak dosing 500mg  then 250mg  daily x 4 days - Start Tessalon Perls take 1 capsule up to 3 times a day as needed for cough - Sent rx Virtussin cough syrup for nightly PRN, caution codeine - cost may be higher if not covered Recommend trial OTC - Mucinex, Tylenol/Ibuprofen PRN, Nasal saline, lozenges, tea with honey/lemon  Return criteria reviewed, follow-up within 1 week if not improved - consider future Chest X-ray if indicated, if cough not improved but feel better may consider albuterol vs prednisone PRN for bronchospasm   Meds ordered this encounter  Medications  . azithromycin (ZITHROMAX Z-PAK) 250 MG tablet    Sig: Take 2 tabs (500mg  total) on Day 1. Take 1 tab (250mg ) daily for next 4 days.    Dispense:  6 tablet    Refill:  0  . benzonatate (TESSALON) 100 MG capsule    Sig: Take 1 capsule (100 mg total) by mouth 3 (three) times daily as needed for cough.    Dispense:  30 capsule    Refill:  0  . guaiFENesin-codeine (ROBITUSSIN AC) 100-10 MG/5ML syrup    Sig: Take 5-10 mLs by mouth 3 (three) times daily as needed for cough.    Dispense:  180 mL    Refill:  0     Follow up plan: Return in about 1 week (around 06/01/2018) for URI Bronchitis.  Andrea Dodson, [facility] 05/25/2018, 9:39 AM

## 2018-05-25 NOTE — Patient Instructions (Addendum)
Thank you for coming to the office today.  1. It sounds like you had an Upper Respiratory Virus that has settled into a Bronchitis, lower respiratory tract infection. I don't have concerns for pneumonia today, and think that this should gradually improve. Once you are feeling better, the cough may take a few weeks to fully resolve. I do hear wheezing and coarse breath sounds, this may be due to the virus  Start Azithromycin Z pak (antibiotic) 2 tabs day 1, then 1 tab x 4 days, complete entire course even if improved  Start Tessalon Perls take 1 capsule up to 3 times a day as needed for cough  - Use nasal saline (Simply Saline or Ocean Spray) to flush nasal congestion multiple times a day, may help cough - Drink plenty of fluids to improve congestion  If your symptoms seem to worsen instead of improve over next several days, including significant fever / chills, worsening shortness of breath, worsening wheezing, or nausea / vomiting and can't take medicines - return sooner or go to hospital Emergency Department for more immediate treatment.   Please schedule a Follow-up Appointment to: Return in about 1 week (around 06/01/2018) for URI Bronchitis.  If you have any other questions or concerns, please feel free to call the office or send a message through [employer]. You may also schedule an earlier appointment if necessary.  Additionally, you may be receiving a survey about your experience at our office within a few days to 1 week by e-mail or mail. We value your feedback.  [name], DO [place]

## 2018-05-28 ENCOUNTER — Encounter: Payer: Self-pay | Admitting: Family Medicine

## 2018-06-08 ENCOUNTER — Encounter: Payer: Self-pay | Admitting: Family Medicine

## 2018-06-10 DIAGNOSIS — E119 Type 2 diabetes mellitus without complications: Secondary | ICD-10-CM | POA: Diagnosis not present

## 2018-06-10 LAB — HM DIABETES EYE EXAM

## 2018-07-03 ENCOUNTER — Other Ambulatory Visit: Payer: Self-pay | Admitting: Family Medicine

## 2018-07-03 DIAGNOSIS — E785 Hyperlipidemia, unspecified: Principal | ICD-10-CM

## 2018-07-03 DIAGNOSIS — E1169 Type 2 diabetes mellitus with other specified complication: Secondary | ICD-10-CM

## 2018-08-02 ENCOUNTER — Other Ambulatory Visit: Payer: Self-pay | Admitting: Family Medicine

## 2018-08-02 DIAGNOSIS — E1169 Type 2 diabetes mellitus with other specified complication: Secondary | ICD-10-CM

## 2018-08-05 ENCOUNTER — Other Ambulatory Visit: Payer: Self-pay

## 2018-08-05 ENCOUNTER — Other Ambulatory Visit: Payer: Medicare Other

## 2018-08-05 DIAGNOSIS — E66811 Obesity, class 1: Secondary | ICD-10-CM

## 2018-08-05 DIAGNOSIS — Z0000 Encounter for general adult medical examination without abnormal findings: Secondary | ICD-10-CM

## 2018-08-05 DIAGNOSIS — E1169 Type 2 diabetes mellitus with other specified complication: Secondary | ICD-10-CM

## 2018-08-05 DIAGNOSIS — E669 Obesity, unspecified: Secondary | ICD-10-CM

## 2018-08-05 DIAGNOSIS — M1711 Unilateral primary osteoarthritis, right knee: Secondary | ICD-10-CM

## 2018-08-05 DIAGNOSIS — I10 Essential (primary) hypertension: Secondary | ICD-10-CM

## 2018-08-05 DIAGNOSIS — E785 Hyperlipidemia, unspecified: Secondary | ICD-10-CM

## 2018-08-06 LAB — HEMOGLOBIN A1C
Hgb A1c MFr Bld: 6.5 % of total Hgb — ABNORMAL HIGH (ref <5.7)
Mean Plasma Glucose: 140 (calc)
eAG (mmol/L): 7.7 (calc)

## 2018-08-12 ENCOUNTER — Other Ambulatory Visit: Payer: Self-pay

## 2018-08-12 ENCOUNTER — Encounter: Payer: Self-pay | Admitting: Family Medicine

## 2018-08-12 ENCOUNTER — Ambulatory Visit (INDEPENDENT_AMBULATORY_CARE_PROVIDER_SITE_OTHER): Payer: Medicare Other | Admitting: Family Medicine

## 2018-08-12 ENCOUNTER — Other Ambulatory Visit: Payer: Self-pay | Admitting: Family Medicine

## 2018-08-12 DIAGNOSIS — E1169 Type 2 diabetes mellitus with other specified complication: Secondary | ICD-10-CM

## 2018-08-12 DIAGNOSIS — I10 Essential (primary) hypertension: Secondary | ICD-10-CM | POA: Diagnosis not present

## 2018-08-12 DIAGNOSIS — Z0000 Encounter for general adult medical examination without abnormal findings: Secondary | ICD-10-CM

## 2018-08-12 DIAGNOSIS — E785 Hyperlipidemia, unspecified: Secondary | ICD-10-CM

## 2018-08-12 DIAGNOSIS — J3089 Other allergic rhinitis: Secondary | ICD-10-CM | POA: Diagnosis not present

## 2018-08-12 NOTE — Progress Notes (Signed)
Virtual Visit via Telephone The purpose of this virtual visit is to provide medical care while limiting exposure to the novel coronavirus (COVID19) for both patient and office staff.  Consent was obtained for phone visit:  Yes.   Answered questions that patient had about telehealth interaction:  Yes.   I discussed the limitations, risks, security and privacy concerns of performing an evaluation and management service by telephone. I also discussed with the patient that there may be a patient responsible charge related to this service. The patient expressed understanding and agreed to proceed.  Patient Location: Home Provider Location: [facility])  ---------------------------------------------------------------------- Chief Complaint  Patient presents with  . Diabetes    S: Reviewed CMA documentation. I have called patient and gathered additional HPI as follows:  CHRONIC DM, Type 2 / Hyperlipidemia Reports no new concerns, doing well improved overall. A1c still 6.5. CBGs:stable readings Meds: Metformin 535m BID, Rosuvastatin 116mdaily Reportsimprovedcompliance. Tolerating well w/o side-effects CurrentlyARB/ASA daily Lifestyle: - Weight stable - Diet improved DM diet - Exercise (walking) Denies hypoglycemia  CHRONIC HTN: Reportsno new concerns. Home BP normal Current Meds -Losartan 5066maily - Prior history w/ ACEi cough on Lisinopril Reports good compliance, took meds today. Tolerating well, w/o complaints. Denies CP, dyspnea, HA, edema, dizziness / lightheadedness  Seasonal allergies Worse outside exposure Taking Loratadine 71m52mily, some relief  Patient is currently at home in isolation Denies any high risk travel to areas of current concern for COVID19. Denies any known or suspected exposure to person with or possibly with COVID19.  Denies any fevers, chills, sweats, body ache, cough, shortness of breath, sinus pain or pressure,  headache, abdominal pain, diarrhea  Past Medical History:  Diagnosis Date  . Allergy   . Arthritis   . Urinary incontinence    Social History   Tobacco Use  . Smoking status: Never Smoker  . Smokeless tobacco: Never Used  Substance Use Topics  . Alcohol use: No  . Drug use: No    Current Outpatient Medications:  .  aspirin EC 81 MG tablet, Take 1 tablet (81 mg total) by mouth daily., Disp: , Rfl:  .  Blood Glucose Monitoring Suppl (ONE TOUCH ULTRA 2) w/Device KIT, 1 device for checking blood sugar once daily, Disp: 1 each, Rfl: 0 .  fluticasone (FLONASE) 50 MCG/ACT nasal spray, Place 2 sprays into both nostrils daily., Disp: 16 g, Rfl: 6 .  glucose blood (ONE TOUCH ULTRA TEST) test strip, CHECK BLOOD SUGAR ONCE DAILY, Disp: 100 each, Rfl: 3 .  Krill Oil 350 MG CAPS, Take 350 mg by mouth daily., Disp: , Rfl:  .  Lancets (ONETOUCH ULTRASOFT) lancets, Use as instructed, Disp: 100 each, Rfl: 12 .  loratadine (CLARITIN) 10 MG tablet, Take 1 tablet (10 mg total) by mouth daily., Disp: 30 tablet, Rfl: 11 .  losartan (COZAAR) 50 MG tablet, TAKE 1 TABLET(50 MG) BY MOUTH DAILY, Disp: 90 tablet, Rfl: 3 .  meloxicam (MOBIC) 15 MG tablet, TAKE 1/2 TO 1 TABLET BY MOUTH DAILY AS NEEDED FOR PAIN, Disp: 90 tablet, Rfl: 2 .  metFORMIN (GLUCOPHAGE) 500 MG tablet, TAKE 1 TABLET BY MOUTH TWO  TIMES DAILY WITH A MEAL, Disp: 180 tablet, Rfl: 3 .  rosuvastatin (CRESTOR) 10 MG tablet, TAKE 1 TABLET BY MOUTH DAILY, Disp: 90 tablet, Rfl: 3  Depression screen PHQ [facility] 08/12/2018 05/25/2018 02/09/2018  Decreased Interest 0 0 0  Down, Depressed, Hopeless 0 0 0  PHQ - 2 Score 0 0  0    No flowsheet data found.  -------------------------------------------------------------------------- O: No physical exam performed due to remote telephone encounter.  Lab results reviewed.  Recent Labs    02/09/18 1000 04/27/18 08/05/18 0804  HGBA1C 6.9* 6.5 6.5*     Recent Results (from the past 2160 hour(s))  HM  DIABETES EYE EXAM     Status: None   Collection Time: 06/10/18 12:00 AM  Result Value Ref Range   HM Diabetic Eye Exam No Retinopathy No Retinopathy  Hemoglobin A1c     Status: Abnormal   Collection Time: 08/05/18  8:04 AM  Result Value Ref Range   Hgb A1c MFr Bld 6.5 (H) <5.7 % of total Hgb    Comment: For someone without known diabetes, a hemoglobin A1c value of 6.5% or greater indicates that they may have  diabetes and this should be confirmed with a follow-up  test. . For someone with known diabetes, a value <7% indicates  that their diabetes is well controlled and a value  greater than or equal to 7% indicates suboptimal  control. A1c targets should be individualized based on  duration of diabetes, age, comorbid conditions, and  other considerations. . Currently, no consensus exists regarding use of hemoglobin A1c for diagnosis of diabetes for children. .    Mean Plasma Glucose 140 (calc)   eAG (mmol/L) 7.7 (calc)    -------------------------------------------------------------------------- A&P:  Problem List Items Addressed This Visit    Environmental and seasonal allergies   Essential hypertension    Well-controlled HTN Home readings infrequent but controlled No known complications  Failed: Lisinopril (ACEi cough)    Plan:  1. Continue current BP regimen Losartan 41m daily 2. Encourage improved lifestyle - low sodium diet, regular exercise 3. Continue monitor BP outside office, bring readings to next visit, if persistently >140/90 or new symptoms notify office sooner 4. Follow-up q 6 months      Hyperlipidemia associated with type 2 diabetes mellitus ([place]    Controlled lipids on statin and lifestyle Fam history HyperTG Calculated ASCVD 10 yr risk score >16%  Plan: 1. Continue Rosuvastatin 13mnightly 2. Continue ASA 8134mor primary ASCVD risk reduction 3. Encourage improved lifestyle - low carb/cholesterol, reduce portion size, continue improving  regular exercise 4. Lipids in 6 mo      Type 2 diabetes mellitus with other specified complication (HCC) - Primary    Controlled, A1c to 6.5, x 2 readings now No hypoglycemia Complications - other including hyperlipidemia, obesity - increases risk of future cardiovascular complications   Plan:  1. Continue current therapy - Metformin 500m69mD 2. Encourage improved lifestyle - low carb, low sugar diet, reduce portion size, continue improving regular exercise 3. Check CBG, bring log to next visit for review 4. Continue ASA, ARB, Statin 5. Follow-up now q 6 months - annual phys w/ labs         No orders of the defined types were placed in this encounter.   Follow-up: - Return in 6 months for Annual Physical - Future labs ordered for 01/2019  Patient verbalizes understanding with the above medical recommendations including the limitation of remote medical advice.  Specific follow-up and call-back criteria were given for patient to follow-up or seek medical care more urgently if needed.   - Time spent in direct consultation with patient on phone: 7 minutes  AlexNobie Putnam S[place]up 08/12/2018, 9:09 AM

## 2018-08-12 NOTE — Patient Instructions (Addendum)
  Recent Labs    02/09/18 1000 04/27/18 08/05/18 0804  HGBA1C 6.9* 6.5 6.5*   Continue current medications - no changes - keep up the good work!  Notify when ready for any refills.  DUE for FASTING BLOOD WORK (no food or drink after midnight before the lab appointment, only water or coffee without cream/sugar on the morning of)  SCHEDULE "Lab Only" visit in the morning at the clinic for lab draw in 6 MONTHS  - Make sure Lab Only appointment is at about 1 week before your next appointment, so that results will be available  For Lab Results, once available within 2-3 days of blood draw, you can can log in to MyChart online to view your results and a brief explanation. Also, we can discuss results at next follow-up visit.  Please schedule a Follow-up Appointment to: Return in about 6 months (around 02/11/2019) for Annual Physical.  If you have any other questions or concerns, please feel free to call the office or send a message through [place]. You may also schedule an earlier appointment if necessary.  Additionally, you may be receiving a survey about your experience at our office within a few days to 1 week by e-mail or mail. We value your feedback.  [name], DO [place]

## 2018-08-12 NOTE — Assessment & Plan Note (Signed)
Controlled lipids on statin and lifestyle Fam history HyperTG Calculated ASCVD 10 yr risk score >16%  Plan: 1. Continue Rosuvastatin 10mg  nightly 2. Continue ASA 81mg  for primary ASCVD risk reduction 3. Encourage improved lifestyle - low carb/cholesterol, reduce portion size, continue improving regular exercise 4. Lipids in 6 mo

## 2018-08-12 NOTE — Assessment & Plan Note (Signed)
Well-controlled HTN Home readings infrequent but controlled No known complications  Failed: Lisinopril (ACEi cough)    Plan:  1. Continue current BP regimen Losartan 50mg  daily 2. Encourage improved lifestyle - low sodium diet, regular exercise 3. Continue monitor BP outside office, bring readings to next visit, if persistently >140/90 or new symptoms notify office sooner 4. Follow-up q 6 months

## 2018-08-12 NOTE — Assessment & Plan Note (Signed)
Controlled, A1c to 6.5, x 2 readings now No hypoglycemia Complications - other including hyperlipidemia, obesity - increases risk of future cardiovascular complications   Plan:  1. Continue current therapy - Metformin 500mg  BID 2. Encourage improved lifestyle - low carb, low sugar diet, reduce portion size, continue improving regular exercise 3. Check CBG, bring log to next visit for review 4. Continue ASA, ARB, Statin 5. Follow-up now q 6 months - annual phys w/ labs

## 2018-09-18 ENCOUNTER — Other Ambulatory Visit: Payer: Self-pay | Admitting: Family Medicine

## 2018-09-18 DIAGNOSIS — E119 Type 2 diabetes mellitus without complications: Secondary | ICD-10-CM

## 2018-09-18 DIAGNOSIS — I10 Essential (primary) hypertension: Secondary | ICD-10-CM

## 2018-10-18 ENCOUNTER — Other Ambulatory Visit: Payer: Self-pay

## 2018-10-18 NOTE — Patient Outreach (Signed)
[facility]) Care Management  10/18/2018  Andrea Dodson 06-08-1950 [id number]   Medication Adherence call to [name] Hippa Identifiers Verify spoke with patient she is due on Losartan 25 mg and Metformin 500 mg patient explain she has medication for another week and ask if we can call [facility] an order both medication patient will received both medication from [facility] in 5-7 days. [name] is showing past due under [place].   [place] Management Direct Dial [phone]  Fax [phone] [email or web address]

## 2019-02-01 ENCOUNTER — Ambulatory Visit (INDEPENDENT_AMBULATORY_CARE_PROVIDER_SITE_OTHER): Payer: Medicare Other

## 2019-02-01 VITALS — Temp 98.3°F

## 2019-02-01 DIAGNOSIS — Z23 Encounter for immunization: Secondary | ICD-10-CM

## 2019-02-01 NOTE — Progress Notes (Signed)
Subjective:   Andrea Dodson is a 68 y.o. female who presents for Medicare Annual (Subsequent) preventive examination.  This visit is being conducted via phone call  - after an attmept to do on video chat - due to the COVID-19 pandemic. This patient has given me verbal consent via phone to conduct this visit, patient states they are participating from their home address. Some vital signs may be absent or patient reported.   Patient identification: identified by name, DOB, and current address.    Review of Systems:   Cardiac Risk Factors include: advanced age (>7mn, >>79women);diabetes mellitus;dyslipidemia;hypertension     Objective:     Vitals: There were no vitals taken for this visit.  There is no height or weight on file to calculate BMI.  Advanced Directives 02/09/2018  Does Patient Have a Medical Advance Directive? No  Does patient want to make changes to medical advance directive? Yes (MAU/Ambulatory/Procedural Areas - Information given)    Tobacco Social History   Tobacco Use  Smoking Status Never Smoker  Smokeless Tobacco Never Used     Counseling given: Not Answered   Clinical Intake:  Pre-visit preparation completed: Yes  Pain : No/denies pain     Nutritional Risks: None Diabetes: No  How often do you need to have someone help you when you read instructions, pamphlets, or other written materials from your doctor or pharmacy?: 1 - Never  Nutrition Risk Assessment:  Has the patient had any N/V/D within the last 2 months?  No  Does the patient have any non-healing wounds?  No  Has the patient had any unintentional weight loss or weight gain?  No   Diabetes:  Is the patient diabetic?  Yes  If diabetic, was a CBG obtained today?  No  Did the patient bring in their glucometer from home?  No  How often do you monitor your CBG's? 4 times a week.   Financial Strains and Diabetes Management:  Are you having any financial strains with the device,  your supplies or your medication? No .  Does the patient want to be seen by Chronic Care Management for management of their diabetes?  No  Would the patient like to be referred to a Nutritionist or for Diabetic Management?  No   Diabetic Exams:  Diabetic Eye Exam: Completed 06/10/2018  Diabetic Foot Exam: Completed 02/11/2019.   Interpreter Needed?: No  Information entered by ::  ,LPN  Past Medical History:  Diagnosis Date  . Allergy   . Arthritis   . Urinary incontinence    Past Surgical History:  Procedure Laterality Date  . APPENDECTOMY    . CHOLECYSTECTOMY  1993  . TONSILLECTOMY     Family History  Problem Relation Age of Onset  . Heart failure Mother   . Heart disease Mother   . Diabetes Mother   . Heart attack Mother   . Cancer Father        lung  . Heart attack Sister   . Diabetes Sister   . Breast cancer Sister [id number]  Social History   Socioeconomic History  . Marital status: Widowed    Spouse name: Not on file  . Number of children: Not on file  . Years of education: Not on file  . Highest education level: Some college, no degree  Occupational History  . Occupation: retired  SScientific laboratory technician . Financial resource strain: Not hard at all  . Food insecurity    Worry: Never true  Inability: Never true  . Transportation needs    Medical: No    Non-medical: No  Tobacco Use  . Smoking status: Never Smoker  . Smokeless tobacco: Never Used  Substance and Sexual Activity  . Alcohol use: No  . Drug use: No  . Sexual activity: Not on file  Lifestyle  . Physical activity    Days per week: 0 days    Minutes per session: 0 min  . Stress: Not at all  Relationships  . Social connections    Talks on phone: More than three times a week    Gets together: More than three times a week    Attends religious service: Never    Active member of club or organization: No    Attends meetings of clubs or organizations: Never    Relationship status: Widowed   Other Topics Concern  . Not on file  Social History Narrative  . Not on file    Outpatient Encounter Medications as of 02/15/2019  Medication Sig  . aspirin EC 81 MG tablet Take 1 tablet (81 mg total) by mouth daily.  . Blood Glucose Monitoring Suppl (ONE TOUCH ULTRA 2) w/Device KIT 1 device for checking blood sugar once daily  . fluticasone (FLONASE) 50 MCG/ACT nasal spray Place 2 sprays into both nostrils daily.  Marland Kitchen glucose blood (ONE TOUCH ULTRA TEST) test strip CHECK BLOOD SUGAR ONCE DAILY  . Krill Oil 350 MG CAPS Take 350 mg by mouth daily.  . Lancets (ONETOUCH ULTRASOFT) lancets Use as instructed  . loratadine (CLARITIN) 10 MG tablet Take 1 tablet (10 mg total) by mouth daily.  Marland Kitchen losartan (COZAAR) 50 MG tablet TAKE 1 TABLET BY MOUTH  DAILY  . meloxicam (MOBIC) 15 MG tablet TAKE 1/2 TO 1 TABLET BY MOUTH DAILY AS NEEDED FOR PAIN  . metFORMIN (GLUCOPHAGE) 500 MG tablet TAKE 1 TABLET BY MOUTH TWO  TIMES DAILY WITH A MEAL  . rosuvastatin (CRESTOR) 10 MG tablet TAKE 1 TABLET BY MOUTH DAILY   No facility-administered encounter medications on file as of 02/15/2019.     Activities of Daily Living In your present state of health, do you have any difficulty performing the following activities: 02/15/2019 02/11/2019  Hearing? N N  Vision? N N  Comment glasses, goes to [facility] -  Difficulty concentrating or making decisions? N N  Walking or climbing stairs? N N  Dressing or bathing? N N  Doing errands, shopping? N N  Preparing Food and eating ? N -  Using the Toilet? N -  In the past six months, have you accidently leaked urine? Y -  Comment pads for protection -  Do you have problems with loss of bowel control? N -  Managing your Medications? N -  Managing your Finances? N -  Housekeeping or managing your Housekeeping? N -  Some recent data might be hidden    Patient Care Team: Olin Hauser, DO as PCP - General (Family Medicine)    Assessment:   This  is a routine wellness examination for Andrea Dodson.  Exercise Activities and Dietary recommendations Current Exercise Habits: Structured exercise class, Type of exercise: stretching, Time (Minutes): 10, Frequency (Times/Week): 7, Weekly Exercise (Minutes/Week): 70, Intensity: Mild, Exercise limited by: None identified  Goals    . DIET - INCREASE WATER INTAKE     Recommend drinking at least 6-8 glasses of water a day        Fall Risk: Fall Risk  02/15/2019 02/11/2019 08/12/2018  05/25/2018 02/09/2018  Falls in the past year? 0 0 0 0 No  Number falls in past yr: 0 - - 0 -  Injury with Fall? 0 - - - -  Follow up - Falls evaluation completed Falls evaluation completed Falls evaluation completed -    FALL RISK PREVENTION PERTAINING TO THE HOME:  FALL RISK PREVENTION PERTAINING TO THE HOME:  Any stairs in or around the home? Yes  stairs going inside  If so, are there any without handrails? No   Home free of loose throw rugs in walkways, pet beds, electrical cords, etc? Yes  Adequate lighting in your home to reduce risk of falls? Yes   ASSISTIVE DEVICES UTILIZED TO PREVENT FALLS:  Life alert? No  Use of a cane, walker or w/c? No  Grab bars in the bathroom? No  Shower chair or bench in shower? No  Elevated toilet seat or a handicapped toilet? No   Depression Screen PHQ 2/9 Scores 02/15/2019 02/11/2019 08/12/2018 05/25/2018  PHQ - 2 Score 0 0 0 0     Cognitive Function     6CIT Screen 02/09/2018  What Year? 0 points  What month? 0 points  What time? 0 points  Count back from 20 0 points  Months in reverse 0 points  Repeat phrase 0 points  Total Score 0    Immunization History  Administered Date(s) Administered  . Fluad Quad(high Dose 65+) 02/01/2019  . Influenza, High Dose Seasonal PF 12/30/2016, 02/09/2018  . Pneumococcal Conjugate-13 07/11/2016  . Pneumococcal Polysaccharide-23 08/10/2017  . Tdap 07/11/2016    Qualifies for Shingles Vaccine? Yes  Zostavax completed n/a.  Due for Shingrix. Education has been provided regarding the importance of this vaccine. Pt has been advised to call insurance company to determine out of pocket expense. Advised may also receive vaccine at local pharmacy or Health Dept. Verbalized acceptance and understanding.  Tdap: up to date   Flu Vaccine: up to date   Pneumococcal Vaccine: up to date .   Screening Tests Health Maintenance  Topic Date Due  . MAMMOGRAM  03/18/2019  . OPHTHALMOLOGY EXAM  06/11/2019  . Fecal DNA (Cologuard)  07/22/2019  . HEMOGLOBIN A1C  08/04/2019  . FOOT EXAM  02/11/2020  . TETANUS/TDAP  07/12/2026  . INFLUENZA VACCINE  Completed  . DEXA SCAN  Completed  . Hepatitis C Screening  Completed  . PNA vac Low Risk Adult  Completed    Cancer Screenings:  Colorectal Screening: Completed cologuard 2018 . Repeat every 3 years  Mammogram: Completed 2018. Repeat every year; ordered   Bone Density: Completed 2008.   Lung Cancer Screening: (Low Dose CT Chest recommended if Age 45-80 years, 30 pack-year currently smoking OR have quit w/in 15years.) does not qualify.    Additional Screening:  Hepatitis C Screening: does qualify; Completed 07/15/2016  Dental Screening: Recommended annual dental exams for proper oral hygiene   Community Resource Referral:  CRR required this visit?  No       Plan:  I have personally reviewed and addressed the Medicare Annual Wellness questionnaire and have noted the following in the patient's chart:  A. Medical and social history B. Use of alcohol, tobacco or illicit drugs  C. Current medications and supplements D. Functional ability and status E.  Nutritional status F.  Physical activity G. Advance directives H. List of other physicians I.  Hospitalizations, surgeries, and ER visits in previous 12 months J.  [place] such as hearing and vision if  needed, cognitive and depression L. Referrals and appointments   In addition, I have reviewed and  discussed with patient certain preventive protocols, quality metrics, and best practice recommendations. A written personalized care plan for preventive services as well as general preventive health recommendations were provided to patient.   Signed,    Bevelyn Ngo, LPN  23/30/0762 Nurse Health Advisor   Nurse Notes: none

## 2019-02-03 DIAGNOSIS — Z0000 Encounter for general adult medical examination without abnormal findings: Secondary | ICD-10-CM | POA: Diagnosis not present

## 2019-02-03 DIAGNOSIS — E1169 Type 2 diabetes mellitus with other specified complication: Secondary | ICD-10-CM | POA: Diagnosis not present

## 2019-02-03 DIAGNOSIS — I10 Essential (primary) hypertension: Secondary | ICD-10-CM | POA: Diagnosis not present

## 2019-02-03 DIAGNOSIS — E785 Hyperlipidemia, unspecified: Secondary | ICD-10-CM | POA: Diagnosis not present

## 2019-02-04 LAB — CBC WITH DIFFERENTIAL/PLATELET
Absolute Monocytes: 660 cells/uL (ref 200–950)
Basophils Absolute: 53 cells/uL (ref 0–200)
Basophils Relative: 0.7 %
Eosinophils Absolute: 293 cells/uL (ref 15–500)
Eosinophils Relative: 3.9 %
HCT: 36.1 % (ref 35.0–45.0)
Hemoglobin: 12.6 g/dL (ref 11.7–15.5)
Lymphs Abs: 1748 cells/uL (ref 850–3900)
MCH: 30.6 pg (ref 27.0–33.0)
MCHC: 34.9 g/dL (ref 32.0–36.0)
MCV: 87.6 fL (ref 80.0–100.0)
MPV: 9.4 fL (ref 7.5–12.5)
Monocytes Relative: 8.8 %
Neutro Abs: 4748 cells/uL (ref 1500–7800)
Neutrophils Relative %: 63.3 %
Platelets: 259 10*3/uL (ref 140–400)
RBC: 4.12 10*6/uL (ref 3.80–5.10)
RDW: 13.1 % (ref 11.0–15.0)
Total Lymphocyte: 23.3 %
WBC: 7.5 10*3/uL (ref 3.8–10.8)

## 2019-02-04 LAB — COMPLETE METABOLIC PANEL WITH GFR
AG Ratio: 1.7 (calc) (ref 1.0–2.5)
ALT: 7 U/L (ref 6–29)
AST: 23 U/L (ref 10–35)
Albumin: 4.2 g/dL (ref 3.6–5.1)
Alkaline phosphatase (APISO): 98 U/L (ref 37–153)
BUN: 13 mg/dL (ref 7–25)
CO2: 23 mmol/L (ref 20–32)
Calcium: 9.7 mg/dL (ref 8.6–10.4)
Chloride: 104 mmol/L (ref 98–110)
Creat: 0.74 mg/dL (ref 0.50–0.99)
GFR, Est African American: 97 mL/min/{1.73_m2} (ref >=60)
GFR, Est Non African American: 84 mL/min/{1.73_m2} (ref >=60)
Globulin: 2.5 g/dL (calc) (ref 1.9–3.7)
Glucose, Bld: 126 mg/dL — ABNORMAL HIGH (ref 65–99)
Potassium: 4.3 mmol/L (ref 3.5–5.3)
Sodium: 137 mmol/L (ref 135–146)
Total Bilirubin: 0.8 mg/dL (ref 0.2–1.2)
Total Protein: 6.7 g/dL (ref 6.1–8.1)

## 2019-02-04 LAB — LIPID PANEL
Cholesterol: 122 mg/dL (ref <200)
HDL: 46 mg/dL — ABNORMAL LOW (ref >=50)
LDL Cholesterol (Calc): 55 mg/dL (calc)
Non-HDL Cholesterol (Calc): 76 mg/dL (calc) (ref <130)
Total CHOL/HDL Ratio: 2.7 (calc) (ref <5.0)
Triglycerides: 128 mg/dL (ref <150)

## 2019-02-04 LAB — HEMOGLOBIN A1C
Hgb A1c MFr Bld: 7 % of total Hgb — ABNORMAL HIGH (ref <5.7)
Mean Plasma Glucose: 154 (calc)
eAG (mmol/L): 8.5 (calc)

## 2019-02-07 ENCOUNTER — Other Ambulatory Visit: Payer: Self-pay

## 2019-02-07 ENCOUNTER — Other Ambulatory Visit: Payer: Medicare Other

## 2019-02-07 DIAGNOSIS — Z0000 Encounter for general adult medical examination without abnormal findings: Secondary | ICD-10-CM

## 2019-02-07 DIAGNOSIS — E1169 Type 2 diabetes mellitus with other specified complication: Secondary | ICD-10-CM

## 2019-02-07 DIAGNOSIS — E785 Hyperlipidemia, unspecified: Secondary | ICD-10-CM

## 2019-02-07 DIAGNOSIS — I10 Essential (primary) hypertension: Secondary | ICD-10-CM

## 2019-02-11 ENCOUNTER — Other Ambulatory Visit: Payer: Self-pay

## 2019-02-11 ENCOUNTER — Ambulatory Visit (INDEPENDENT_AMBULATORY_CARE_PROVIDER_SITE_OTHER): Payer: Medicare Other | Admitting: Family Medicine

## 2019-02-11 ENCOUNTER — Encounter: Payer: Self-pay | Admitting: Family Medicine

## 2019-02-11 VITALS — BP 134/70 | HR 63 | Temp 98.1°F | Resp 16 | Ht 60.0 in | Wt 157.0 lb

## 2019-02-11 DIAGNOSIS — Z1231 Encounter for screening mammogram for malignant neoplasm of breast: Secondary | ICD-10-CM

## 2019-02-11 DIAGNOSIS — I10 Essential (primary) hypertension: Secondary | ICD-10-CM

## 2019-02-11 DIAGNOSIS — E1169 Type 2 diabetes mellitus with other specified complication: Secondary | ICD-10-CM

## 2019-02-11 DIAGNOSIS — E785 Hyperlipidemia, unspecified: Secondary | ICD-10-CM

## 2019-02-11 DIAGNOSIS — Z0000 Encounter for general adult medical examination without abnormal findings: Secondary | ICD-10-CM

## 2019-02-11 DIAGNOSIS — E669 Obesity, unspecified: Secondary | ICD-10-CM | POA: Diagnosis not present

## 2019-02-11 DIAGNOSIS — E66811 Obesity, class 1: Secondary | ICD-10-CM

## 2019-02-11 DIAGNOSIS — R600 Localized edema: Secondary | ICD-10-CM

## 2019-02-11 NOTE — Progress Notes (Signed)
Subjective:    Patient ID: Andrea Dodson, female    DOB: October 24, 1950, 68 y.o.   MRN: [id number]  Andrea Dodson is a 68 y.o. female presenting on 02/11/2019 for Annual Exam   HPI   Here for Annual Physical and Lab Review.  CHRONIC DM, Type 2 Reports no new concerns, doing well overall had period of elevated sugars with sweets now improving, A1c 7.0 up from 6.5 CBGs:stable readings Meds: Metformin 519m BID Reportsimprovedcompliance. Tolerating well w/o side-effects CurrentlyARB/ASA daily Lifestyle: - Weightstable - Diet improved DM diet. Occasional sweets in moderation, now reduced. Limiting flavored coffee now. - Exercise (walking) Denies hypoglycemia  CHRONIC HTN: Reportsno new concerns. Home BP normal Current Meds -Losartan 580mdaily - Prior history w/ ACEi cough on Lisinopril Reports good compliance, took meds today. Tolerating well, w/o complaints.  HYPERLIPIDEMIA: - Reports nonewconcerns. Last lipid panel 01/2019,mostlycontrolledonly mild abnormal - Currently taking Rosuvastatin 1056maily - Taking ASA 52m2mily   PMH - Seasonal allergies - taking Loratadine 10mg57mly, controls her allergy symptoms, has done well this season  Additional complaints Salivary Gland Swelling - L lower jaw mandibular seems still has episodic swelling, possibly with dry mouth assoc. Chronic problem episodic, denies infection or significant pain or redness or fever assoc.  Health Maintenance:  UTD Flu vaccine 02/01/19  UTD Last Mammogram 03/17/17, negative bi-rads 1 - NEXT due 02/2019 - order placed.  Last Cologuard 07/2016, negative = next due 2021  UTD Pneumonia vaccine series.  Depression screen [facility] 08/12/2018 05/25/2018  Decreased Interest 0 0 0  Down, Depressed, Hopeless 0 0 0  PHQ - 2 Score 0 0 0    Past Medical History:  Diagnosis Date  . Allergy   . Arthritis   . Urinary incontinence    Past Surgical History:  Procedure  Laterality Date  . APPENDECTOMY    . CHOLECYSTECTOMY  1993  . TONSILLECTOMY     Social History   Socioeconomic History  . Marital status: Widowed    Spouse name: Not on file  . Number of children: Not on file  . Years of education: Not on file  . Highest education level: Some college, no degree  Occupational History  . 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 Financial resource strain: Not hard at all  . Food insecurity    Worry: Never true    Inability: Never true  . Transportation needs    Medical: No    Non-medical: No  Tobacco Use  . Smoking status: Never Smoker  . Smokeless tobacco: Never Used  Substance and Sexual Activity  . Alcohol use: No  . Drug use: No  . Sexual activity: Not on file  Lifestyle  . Physical activity    Days per week: 0 days    Minutes per session: 0 min  . Stress: Not at all  Relationships  . Social connections    Talks on phone: More than three times a week    Gets together: More than three times a week    Attends religious service: Never    Active member of club or organization: No    Attends meetings of clubs or organizations: Never    Relationship status: Widowed  . Intimate partner violence    Fear of current or ex partner: No    Emotionally abused: No    Physically abused: No    Forced sexual activity: No  Other Topics Concern  . Not  on file  Social History Narrative   Working full time    Family History  Problem Relation Age of Onset  . Heart failure Mother   . Heart disease Mother   . Diabetes Mother   . Heart attack Mother   . Cancer Father        lung  . Heart attack Sister   . Diabetes Sister   . Breast cancer Sister 80   Current Outpatient Medications on File Prior to Visit  Medication Sig  . aspirin EC 81 MG tablet Take 1 tablet (81 mg total) by mouth daily.  . Blood Glucose Monitoring Suppl (ONE TOUCH ULTRA 2) w/Device KIT 1 device for checking blood sugar once daily  . fluticasone  (FLONASE) 50 MCG/ACT nasal spray Place 2 sprays into both nostrils daily.  Marland Kitchen glucose blood (ONE TOUCH ULTRA TEST) test strip CHECK BLOOD SUGAR ONCE DAILY  . Krill Oil 350 MG CAPS Take 350 mg by mouth daily.  . Lancets (ONETOUCH ULTRASOFT) lancets Use as instructed  . loratadine (CLARITIN) 10 MG tablet Take 1 tablet (10 mg total) by mouth daily.  Marland Kitchen losartan (COZAAR) 50 MG tablet TAKE 1 TABLET BY MOUTH  DAILY  . meloxicam (MOBIC) 15 MG tablet TAKE 1/2 TO 1 TABLET BY MOUTH DAILY AS NEEDED FOR PAIN  . metFORMIN (GLUCOPHAGE) 500 MG tablet TAKE 1 TABLET BY MOUTH TWO  TIMES DAILY WITH A MEAL  . rosuvastatin (CRESTOR) 10 MG tablet TAKE 1 TABLET BY MOUTH DAILY   No current facility-administered medications on file prior to visit.     Review of Systems  Constitutional: Negative for activity change, appetite change, chills, diaphoresis, fatigue and fever.  HENT: Negative for congestion and hearing loss.        Salivary gland fullness, dry mouth  Eyes: Negative for visual disturbance.  Respiratory: Negative for apnea, cough, chest tightness, shortness of breath and wheezing.   Cardiovascular: Negative for chest pain, palpitations and leg swelling.  Gastrointestinal: Negative for abdominal pain, anal bleeding, blood in stool, constipation, diarrhea, nausea and vomiting.  Endocrine: Negative for cold intolerance.  Genitourinary: Negative for decreased urine volume, difficulty urinating, dysuria, frequency, hematuria and urgency.  Musculoskeletal: Negative for arthralgias, back pain and neck pain.  Skin: Negative for rash.  Allergic/Immunologic: Negative for environmental allergies.  Neurological: Negative for dizziness, weakness, light-headedness, numbness and headaches.  Hematological: Negative for adenopathy.  Psychiatric/Behavioral: Negative for behavioral problems, dysphoric mood and sleep disturbance. The patient is not nervous/anxious.    Per HPI unless specifically indicated above       Objective:    BP 134/70 (BP Location: Left Arm, Cuff Size: Normal)   Pulse 63   Temp 98.1 F (36.7 C) (Oral)   Resp 16   Ht 5' (1.524 m)   Wt 157 lb (71.2 kg)   BMI 30.66 kg/m   Wt Readings from Last 3 Encounters:  02/11/19 157 lb (71.2 kg)  05/25/18 152 lb 6.4 oz (69.1 kg)  02/09/18 157 lb (71.2 kg)    Physical Exam Vitals signs and nursing note reviewed.  Constitutional:      General: She is not in acute distress.    Appearance: She is well-developed. She is not diaphoretic.     Comments: Well-appearing, comfortable, cooperative  HENT:     Head: Normocephalic and atraumatic.     Right Ear: Tympanic membrane and ear canal normal.     Left Ear: Tympanic membrane and ear canal normal.     Mouth/Throat:  Mouth: Mucous membranes are moist.  Eyes:     General:        Right eye: No discharge.        Left eye: No discharge.     Conjunctiva/sclera: Conjunctivae normal.     Pupils: Pupils are equal, round, and reactive to light.  Neck:     Musculoskeletal: Normal range of motion and neck supple.     Thyroid: No thyromegaly.     Comments: Left lower mandibular region salivary gland with slight fullness no nodule or tenderness. No erythema. Cardiovascular:     Rate and Rhythm: Normal rate and regular rhythm.     Heart sounds: Normal heart sounds. No murmur.  Pulmonary:     Effort: Pulmonary effort is normal. No respiratory distress.     Breath sounds: Normal breath sounds. No wheezing or rales.  Abdominal:     General: Bowel sounds are normal. There is no distension.     Palpations: Abdomen is soft. There is no mass.     Tenderness: There is no abdominal tenderness.  Musculoskeletal: Normal range of motion.        General: No tenderness.     Comments: Upper / Lower Extremities: - Normal muscle tone, strength bilateral upper extremities 5/5, lower extremities 5/5  Lymphadenopathy:     Cervical: No cervical adenopathy.  Skin:    General: Skin is warm and dry.      Findings: No erythema or rash.  Neurological:     Mental Status: She is alert and oriented to person, place, and time.     Comments: Distal sensation intact to light touch all extremities  Psychiatric:        Behavior: Behavior normal.     Comments: Well groomed, good eye contact, normal speech and thoughts      Diabetic Foot Exam - Simple   Simple Foot Form Diabetic Foot exam was performed with the following findings: Yes 02/11/2019  9:26 AM  Visual Inspection No deformities, no ulcerations, no other skin breakdown bilaterally: Yes Sensation Testing Intact to touch and monofilament testing bilaterally: Yes Pulse Check Posterior Tibialis and Dorsalis pulse intact bilaterally: Yes Comments     Results for orders placed or performed in visit on 08/05/18  Lipid panel  Result Value Ref Range   Cholesterol 122 <200 mg/dL   HDL 46 (L) > OR = 50 mg/dL   Triglycerides 128 <150 mg/dL   LDL Cholesterol (Calc) 55 mg/dL (calc)   Total CHOL/HDL Ratio 2.7 <5.0 (calc)   Non-HDL Cholesterol (Calc) 76 <130 mg/dL (calc)  COMPLETE METABOLIC PANEL WITH GFR  Result Value Ref Range   Glucose, Bld 126 (H) 65 - 99 mg/dL   BUN 13 7 - 25 mg/dL   Creat 0.74 0.50 - 0.99 mg/dL   GFR, Est Non African American 84 > OR = 60 mL/min/1.39m   GFR, Est African American 97 > OR = 60 mL/min/1.750m  BUN/Creatinine Ratio NOT APPLICABLE 6 - 22 (calc)   Sodium 137 135 - 146 mmol/L   Potassium 4.3 3.5 - 5.3 mmol/L   Chloride 104 98 - 110 mmol/L   CO2 23 20 - 32 mmol/L   Calcium 9.7 8.6 - 10.4 mg/dL   Total Protein 6.7 6.1 - 8.1 g/dL   Albumin 4.2 3.6 - 5.1 g/dL   Globulin 2.5 1.9 - 3.7 g/dL (calc)   AG Ratio 1.7 1.0 - 2.5 (calc)   Total Bilirubin 0.8 0.2 - 1.2 mg/dL   Alkaline phosphatase (  APISO) 98 37 - 153 U/L   AST 23 10 - 35 U/L   ALT 7 6 - 29 U/L  CBC with Differential/Platelet  Result Value Ref Range   WBC 7.5 3.8 - 10.8 Thousand/uL   RBC 4.12 3.80 - 5.10 Million/uL   Hemoglobin 12.6 11.7 - 15.5  g/dL   HCT 36.1 35.0 - 45.0 %   MCV 87.6 80.0 - 100.0 fL   MCH 30.6 27.0 - 33.0 pg   MCHC 34.9 32.0 - 36.0 g/dL   RDW 13.1 11.0 - 15.0 %   Platelets 259 140 - 400 Thousand/uL   MPV 9.4 7.5 - 12.5 fL   Neutro Abs 4,748 1,500 - 7,800 cells/uL   Lymphs Abs 1,748 850 - 3,900 cells/uL   Absolute Monocytes 660 200 - 950 cells/uL   Eosinophils Absolute 293 15 - 500 cells/uL   Basophils Absolute 53 0 - 200 cells/uL   Neutrophils Relative % 63.3 %   Total Lymphocyte 23.3 %   Monocytes Relative 8.8 %   Eosinophils Relative 3.9 %   Basophils Relative 0.7 %  Hemoglobin A1c  Result Value Ref Range   Hgb A1c MFr Bld 7.0 (H) <5.7 % of total Hgb   Mean Plasma Glucose 154 (calc)   eAG (mmol/L) 8.5 (calc)       Assessment & Plan:   Problem List Items Addressed This Visit    Type 2 diabetes mellitus with other specified complication (HCC)    Controlled, slightly elevated A1c 6.5 up to 7, some inc sweets No hypoglycemia Complications - other including hyperlipidemia, obesity - increases risk of future cardiovascular complications   Plan:  1. Continue current therapy - Metformin 545m BID 2. Encourage improved lifestyle - low carb, low sugar diet, reduce portion size, continue improving regular exercise 3. Check CBG, bring log to next visit for review 4. Continue ASA, ARB, Statin DM Foot exam today DM Eye already scheduled for 05/2019  Offered can return optional in 6 months for DM A1c, otherwise keep yearly apt      Salivary gland swelling    Mild episodic problem, chronic L lower jaw sub mandibular No sign of infection or other complication May be due to dry mouth vs salivary stone  Advised to try secret[place] non pharm options lemon drops etc, improve hydration F/u if worse      Obesity (BMI 30.0-34.9)    Mild wt gain over past 6-8 months Now improving diet Encouraged lifestyle      Hyper[place] type 2 diabetes mellitus [place]    Controlled lipids on statin  and lifestyle Fam history HyperTG Calculated ASCVD 10 yr risk score >16%  Plan: 1. Continue Rosuvastatin 137mnightly 2. Continue ASA 8124mor primary ASCVD risk reduction 3. Encourage improved lifestyle - low carb/cholesterol, reduce portion size, continue improving regular exercise      Essential hypertension    Mild elevated BP - repeat check normalized. Home readings infrequent but controlled No known complications  Failed: Lisinopril (ACEi cough)    Plan:  1. Continue current BP regimen Losartan 49m66mily 2. Encourage improved lifestyle - low sodium diet, regular exercise 3. Continue monitor BP outside office, bring readings to next visit, if persistently >140/90 or new symptoms notify office sooner       Other Visit Diagnoses    Annual physical exam    -  Primary   Encounter for screening mammogram for malignant neoplasm of breast       Relevant Orders  MM DIGITAL SCREENING BILATERAL      No orders of the defined types were placed in this encounter.  Updated Health Maintenance information - Ordered Mammogram due 02/2019 Reviewed recent lab results with patient Encouraged improvement to lifestyle with diet and exercise - Goal of weight loss   Follow up plan: Return in about 6 months (around 08/12/2019) for 6 month DM A1c.  [name], [facility] 02/11/2019, 9:16 AM

## 2019-02-11 NOTE — Assessment & Plan Note (Signed)
Controlled lipids on statin and lifestyle Fam history HyperTG Calculated ASCVD 10 yr risk score >16%  Plan: 1. Continue Rosuvastatin 10mg  nightly 2. Continue ASA 81mg  for primary ASCVD risk reduction 3. Encourage improved lifestyle - low carb/cholesterol, reduce portion size, continue improving regular exercise

## 2019-02-11 NOTE — Assessment & Plan Note (Addendum)
Controlled, slightly elevated A1c 6.5 up to 7, some inc sweets No hypoglycemia Complications - other including hyperlipidemia, obesity - increases risk of future cardiovascular complications   Plan:  1. Continue current therapy - Metformin 500mg  BID 2. Encourage improved lifestyle - low carb, low sugar diet, reduce portion size, continue improving regular exercise 3. Check CBG, bring log to next visit for review 4. Continue ASA, ARB, Statin DM Foot exam today DM Eye already scheduled for 05/2019  Offered can return optional in 6 months for DM A1c, otherwise keep yearly apt

## 2019-02-11 NOTE — Patient Instructions (Addendum)
Thank you for coming to the office today.  Keep up the good work!  Recent Labs    04/27/18 08/05/18 0804 02/03/19 1102  HGBA1C 6.5 6.5* 7.0*    Mammogram screening for breast cancer  DEXA Scan (Bone mineral density) screening for osteoporosis  Call the [facility] below anytime to schedule your own appointment now that order has been placed.  [facility] [place], [place] [address] Phone: [phone]  Salivary stones can be caused by dry mouth, keep improving hydration to help limit this, and try lemon drops or hard sour candy to help improve salivary flow, follow up if develop infection pain or redness or fever  Please schedule a Follow-up Appointment to: Return in about 6 months (around 08/12/2019) for 6 month DM A1c.  If you have any other questions or concerns, please feel free to call the office or send a message through [place]. You may also schedule an earlier appointment if necessary.  Additionally, you may be receiving a survey about your experience at our office within a few days to 1 week by e-mail or mail. We value your feedback.  [name], DO [place]

## 2019-02-11 NOTE — Assessment & Plan Note (Signed)
Mild elevated BP - repeat check normalized. Home readings infrequent but controlled No known complications  Failed: Lisinopril (ACEi cough)    Plan:  1. Continue current BP regimen Losartan 50mg  daily 2. Encourage improved lifestyle - low sodium diet, regular exercise 3. Continue monitor BP outside office, bring readings to next visit, if persistently >140/90 or new symptoms notify office sooner

## 2019-02-11 NOTE — Assessment & Plan Note (Signed)
Mild episodic problem, chronic L lower jaw sub mandibular No sign of infection or other complication May be due to dry mouth vs salivary stone  Advised to try [place] non pharm options lemon drops etc, improve hydration F/u if worse

## 2019-02-11 NOTE — Assessment & Plan Note (Signed)
Mild wt gain over past 6-8 months Now improving diet Encouraged lifestyle

## 2019-02-15 ENCOUNTER — Ambulatory Visit (INDEPENDENT_AMBULATORY_CARE_PROVIDER_SITE_OTHER): Payer: Medicare Other

## 2019-02-15 DIAGNOSIS — Z0000 Encounter for general adult medical examination without abnormal findings: Secondary | ICD-10-CM

## 2019-02-15 NOTE — Patient Instructions (Addendum)
[name] , Thank you for taking time to come for your Medicare Wellness Visit. I appreciate your ongoing commitment to your health goals. Please review the following plan we discussed and let me know if I can assist you in the future.   Screening recommendations/referrals: Colonoscopy: completed cologuard 2018 Mammogram: Please call [phone] to schedule your mammogram.  Bone Density: up to date Recommended yearly ophthalmology/optometry visit for glaucoma screening and checkup Recommended yearly dental visit for hygiene and checkup  Vaccinations: Influenza vaccine: up to date Pneumococcal vaccine: up to date Tdap vaccine: up to date Shingles vaccine: shingrix eligible    Advanced directives: Advance directive discussed with you today.Once this is complete please bring a copy in to our office so we can scan it into your chart.  Conditions/risks identified: diabetic, discussed chronic care management team   Next appointment: Follow up in one year for your annual wellness visit    Preventive Care 65 Years and Older, Female Preventive care refers to lifestyle choices and visits with your health care provider that can promote health and wellness. What does preventive care include?  A yearly physical exam. This is also called an annual well check.  Dental exams once or twice a year.  Routine eye exams. Ask your health care provider how often you should have your eyes checked.  Personal lifestyle choices, including:  Daily care of your teeth and gums.  Regular physical activity.  Eating a healthy diet.  Avoiding tobacco and drug use.  Limiting alcohol use.  Practicing safe sex.  Taking low-dose aspirin every day.  Taking vitamin and mineral supplements as recommended by your health care provider. What happens during an annual well check? The services and screenings done by your health care provider during your annual well check will depend on your age, overall health,  lifestyle risk factors, and family history of disease. Counseling  Your health care provider may ask you questions about your:  Alcohol use.  Tobacco use.  Drug use.  Emotional well-being.  Home and relationship well-being.  Sexual activity.  Eating habits.  History of falls.  Memory and ability to understand (cognition).  Work and work Statistician.  Reproductive health. Screening  You may have the following tests or measurements:  Height, weight, and BMI.  Blood pressure.  Lipid and cholesterol levels. These may be checked every 5 years, or more frequently if you are over 27 years old.  Skin check.  Lung cancer screening. You may have this screening every year starting at age 60 if you have a 30-pack-year history of smoking and currently smoke or have quit within the past 15 years.  Fecal occult blood test (FOBT) of the stool. You may have this test every year starting at age 35.  Flexible sigmoidoscopy or colonoscopy. You may have a sigmoidoscopy every 5 years or a colonoscopy every 10 years starting at age 83.  Hepatitis C blood test.  Hepatitis B blood test.  Sexually transmitted disease (STD) testing.  Diabetes screening. This is done by checking your blood sugar (glucose) after you have not eaten for a while (fasting). You may have this done every 1-3 years.  Bone density scan. This is done to screen for osteoporosis. You may have this done starting at age 4.  Mammogram. This may be done every 1-2 years. Talk to your health care provider about how often you should have regular mammograms. Talk with your health care provider about your test results, treatment options, and if necessary, the need  for more tests. Vaccines  Your health care provider may recommend certain vaccines, such as:  Influenza vaccine. This is recommended every year.  Tetanus, diphtheria, and acellular pertussis (Tdap, Td) vaccine. You may need a Td booster every 10 years.  Zoster  vaccine. You may need this after age 73.  Pneumococcal 13-valent conjugate (PCV13) vaccine. One dose is recommended after age 37.  Pneumococcal polysaccharide (PPSV23) vaccine. One dose is recommended after age 52. Talk to your health care provider about which screenings and vaccines you need and how often you need them. This information is not intended to replace advice given to you by your health care provider. Make sure you discuss any questions you have with your health care provider. Document Released: 05/04/2015 Document Revised: 12/26/2015 Document Reviewed: 02/06/2015 Elsevier Interactive Patient Education  2017 [place] Prevention in the Home Falls can cause injuries. They can happen to people of all ages. There are many things you can do to make your home safe and to help prevent falls. What can I do on the outside of my home?  Regularly fix the edges of walkways and driveways and fix any cracks.  Remove anything that might make you trip as you walk through a door, such as a raised step or threshold.  Trim any bushes or trees on the path to your home.  Use bright outdoor lighting.  Clear any walking paths of anything that might make someone trip, such as rocks or tools.  Regularly check to see if handrails are loose or broken. Make sure that both sides of any steps have handrails.  Any raised decks and porches should have guardrails on the edges.  Have any leaves, snow, or ice cleared regularly.  Use sand or salt on walking paths during winter.  Clean up any spills in your garage right away. This includes oil or grease spills. What can I do in the bathroom?  Use night lights.  Install grab bars by the toilet and in the tub and shower. Do not use towel bars as grab bars.  Use non-skid mats or decals in the tub or shower.  If you need to sit down in the shower, use a plastic, non-slip stool.  Keep the floor dry. Clean up any water that spills on the  floor as soon as it happens.  Remove soap buildup in the tub or shower regularly.  Attach bath mats securely with double-sided non-slip rug tape.  Do not have throw rugs and other things on the floor that can make you trip. What can I do in the bedroom?  Use night lights.  Make sure that you have a light by your bed that is easy to reach.  Do not use any sheets or blankets that are too big for your bed. They should not hang down onto the floor.  Have a firm chair that has side arms. You can use this for support while you get dressed.  Do not have throw rugs and other things on the floor that can make you trip. What can I do in the kitchen?  Clean up any spills right away.  Avoid walking on wet floors.  Keep items that you use a lot in easy-to-reach places.  If you need to reach something above you, use a strong step stool that has a grab bar.  Keep electrical cords out of the way.  Do not use floor polish or wax that makes floors slippery. If you must use wax, use  non-skid floor wax.  Do not have throw rugs and other things on the floor that can make you trip. What can I do with my stairs?  Do not leave any items on the stairs.  Make sure that there are handrails on both sides of the stairs and use them. Fix handrails that are broken or loose. Make sure that handrails are as long as the stairways.  Check any carpeting to make sure that it is firmly attached to the stairs. Fix any carpet that is loose or worn.  Avoid having throw rugs at the top or bottom of the stairs. If you do have throw rugs, attach them to the floor with carpet tape.  Make sure that you have a light switch at the top of the stairs and the bottom of the stairs. If you do not have them, ask someone to add them for you. What else can I do to help prevent falls?  Wear shoes that:  Do not have high heels.  Have rubber bottoms.  Are comfortable and fit you well.  Are closed at the toe. Do not wear  sandals.  If you use a stepladder:  Make sure that it is fully opened. Do not climb a closed stepladder.  Make sure that both sides of the stepladder are locked into place.  Ask someone to hold it for you, if possible.  Clearly mark and make sure that you can see:  Any grab bars or handrails.  First and last steps.  Where the edge of each step is.  Use tools that help you move around (mobility aids) if they are needed. These include:  Canes.  Walkers.  Scooters.  Crutches.  Turn on the lights when you go into a dark area. Replace any light bulbs as soon as they burn out.  Set up your furniture so you have a clear path. Avoid moving your furniture around.  If any of your floors are uneven, fix them.  If there are any pets around you, be aware of where they are.  Review your medicines with your doctor. Some medicines can make you feel dizzy. This can increase your chance of falling. Ask your doctor what other things that you can do to help prevent falls. This information is not intended to replace advice given to you by your health care provider. Make sure you discuss any questions you have with your health care provider. Document Released: 02/01/2009 Document Revised: 09/13/2015 Document Reviewed: 05/12/2014 Elsevier Interactive Patient Education  2017 [employer].

## 2019-02-22 ENCOUNTER — Other Ambulatory Visit: Payer: Self-pay | Admitting: Family Medicine

## 2019-03-30 ENCOUNTER — Encounter: Payer: Self-pay | Admitting: Emergency Medicine

## 2019-03-30 ENCOUNTER — Emergency Department: Payer: Medicare Other

## 2019-03-30 ENCOUNTER — Other Ambulatory Visit: Payer: Self-pay

## 2019-03-30 ENCOUNTER — Emergency Department
Admission: EM | Admit: 2019-03-30 | Discharge: 2019-03-30 | Disposition: A | Discharge status: Home / Self Care | Payer: Medicare Other | Attending: Emergency Medicine | Admitting: Emergency Medicine

## 2019-03-30 DIAGNOSIS — I10 Essential (primary) hypertension: Secondary | ICD-10-CM | POA: Insufficient documentation

## 2019-03-30 DIAGNOSIS — N200 Calculus of kidney: Secondary | ICD-10-CM

## 2019-03-30 DIAGNOSIS — Z7984 Long term (current) use of oral hypoglycemic drugs: Secondary | ICD-10-CM | POA: Insufficient documentation

## 2019-03-30 DIAGNOSIS — R1032 Left lower quadrant pain: Secondary | ICD-10-CM | POA: Diagnosis present

## 2019-03-30 DIAGNOSIS — R112 Nausea with vomiting, unspecified: Secondary | ICD-10-CM | POA: Diagnosis not present

## 2019-03-30 DIAGNOSIS — Z7982 Long term (current) use of aspirin: Secondary | ICD-10-CM | POA: Insufficient documentation

## 2019-03-30 DIAGNOSIS — Z79899 Other long term (current) drug therapy: Secondary | ICD-10-CM | POA: Insufficient documentation

## 2019-03-30 DIAGNOSIS — E119 Type 2 diabetes mellitus without complications: Secondary | ICD-10-CM | POA: Diagnosis not present

## 2019-03-30 DIAGNOSIS — R109 Unspecified abdominal pain: Secondary | ICD-10-CM | POA: Diagnosis not present

## 2019-03-30 LAB — COMPREHENSIVE METABOLIC PANEL
ALT: 12 U/L (ref 0–44)
AST: 27 U/L (ref 15–41)
Albumin: 4.7 g/dL (ref 3.5–5.0)
Alkaline Phosphatase: 110 U/L (ref 38–126)
Anion gap: 11 (ref 5–15)
BUN: 17 mg/dL (ref 8–23)
CO2: 22 mmol/L (ref 22–32)
Calcium: 9.6 mg/dL (ref 8.9–10.3)
Chloride: 102 mmol/L (ref 98–111)
Creatinine, Ser: 1.25 mg/dL — ABNORMAL HIGH (ref 0.44–1.00)
GFR calc Af Amer: 52 mL/min — ABNORMAL LOW (ref >60)
GFR calc non Af Amer: 44 mL/min — ABNORMAL LOW (ref >60)
Glucose, Bld: 169 mg/dL — ABNORMAL HIGH (ref 70–99)
Potassium: 4.5 mmol/L (ref 3.5–5.1)
Sodium: 135 mmol/L (ref 135–145)
Total Bilirubin: 1.2 mg/dL (ref 0.3–1.2)
Total Protein: 8 g/dL (ref 6.5–8.1)

## 2019-03-30 LAB — CBC
HCT: 37.8 % (ref 36.0–46.0)
Hemoglobin: 13.8 g/dL (ref 12.0–15.0)
MCH: 29.9 pg (ref 26.0–34.0)
MCHC: 36.5 g/dL — ABNORMAL HIGH (ref 30.0–36.0)
MCV: 82 fL (ref 80.0–100.0)
Platelets: 293 10*3/uL (ref 150–400)
RBC: 4.61 MIL/uL (ref 3.87–5.11)
RDW: 12.3 % (ref 11.5–15.5)
WBC: 13.4 10*3/uL — ABNORMAL HIGH (ref 4.0–10.5)
nRBC: 0 % (ref 0.0–0.2)

## 2019-03-30 LAB — URINALYSIS, COMPLETE (UACMP) WITH MICROSCOPIC
Bacteria, UA: NONE SEEN
Bilirubin Urine: NEGATIVE
Glucose, UA: NEGATIVE mg/dL
Ketones, ur: 5 mg/dL — AB
Leukocytes,Ua: NEGATIVE
Nitrite: NEGATIVE
Protein, ur: 30 mg/dL — AB
Specific Gravity, Urine: 1.02 (ref 1.005–1.030)
pH: 6 (ref 5.0–8.0)

## 2019-03-30 LAB — LIPASE, BLOOD: Lipase: 26 U/L (ref 11–51)

## 2019-03-30 MED ORDER — ONDANSETRON 4 MG PO TBDP: 4.0000 mg | ORAL_TABLET | Freq: Three times a day (TID) | ORAL | 0 refills | Status: DC | PRN

## 2019-03-30 MED ORDER — SODIUM CHLORIDE 0.9% FLUSH: 3.0000 mL | Freq: Once | INTRAVENOUS | Status: DC

## 2019-03-30 MED ORDER — KETOROLAC TROMETHAMINE 30 MG/ML IJ SOLN: 30.0000 mg | Freq: Once | INTRAMUSCULAR | Status: DC

## 2019-03-30 MED ORDER — IOHEXOL 9 MG/ML PO SOLN
500.0000 mL | Freq: Once | ORAL | Status: AC | PRN
  Administered 2019-03-30: 500 mL via ORAL
  Filled 2019-03-30: qty 500

## 2019-03-30 MED ORDER — FENTANYL CITRATE (PF) 100 MCG/2ML IJ SOLN
50.0000 ug | Freq: Once | INTRAMUSCULAR | Status: AC
  Administered 2019-03-30: 50 ug via INTRAVENOUS
  Filled 2019-03-30: qty 2

## 2019-03-30 MED ORDER — ONDANSETRON HCL 4 MG/2ML IJ SOLN
4.0000 mg | Freq: Once | INTRAMUSCULAR | Status: AC
  Administered 2019-03-30: 4 mg via INTRAVENOUS
  Filled 2019-03-30: qty 2

## 2019-03-30 MED ORDER — KETOROLAC TROMETHAMINE 30 MG/ML IJ SOLN
30.0000 mg | Freq: Once | INTRAMUSCULAR | Status: AC
  Administered 2019-03-30: 30 mg via INTRAVENOUS
  Filled 2019-03-30: qty 1

## 2019-03-30 MED ORDER — IOHEXOL 300 MG/ML  SOLN
75.0000 mL | Freq: Once | INTRAMUSCULAR | Status: AC | PRN
  Administered 2019-03-30: 75 mL via INTRAVENOUS
  Filled 2019-03-30: qty 75

## 2019-03-30 MED ORDER — OXYCODONE-ACETAMINOPHEN 5-325 MG PO TABS: 1.0000 | ORAL_TABLET | ORAL | 0 refills | Status: DC | PRN

## 2019-03-30 MED ORDER — TAMSULOSIN HCL 0.4 MG PO CAPS: 0.4000 mg | ORAL_CAPSULE | Freq: Every day | ORAL | 0 refills | Status: DC

## 2019-03-30 MED ORDER — SODIUM CHLORIDE 0.9 % IV BOLUS
1000.0000 mL | Freq: Once | INTRAVENOUS | Status: AC
  Administered 2019-03-30: 1000 mL via INTRAVENOUS

## 2019-03-30 NOTE — ED Notes (Signed)
Pt reports left lower abdominal pain that started 0600 today. Reports vomiting that started today as well, reports not able to keep food or water down. Denies fevers, reports chills this morning.  Abdomen soft, reports tenderness at LLQ

## 2019-03-30 NOTE — ED Triage Notes (Signed)
Pt here with c/o LLQ abd pain that began this am, has been vomiting today as well, "can't even keep water down." Appears in no distress, denies diarrhea.

## 2019-03-30 NOTE — ED Notes (Signed)
Pt ambulatory to the bathroom without difficulty and reports she is having no pain at this time.

## 2019-03-30 NOTE — ED Notes (Signed)
Pt reports pain has returned after urinating.

## 2019-03-30 NOTE — ED Notes (Signed)
CT at bedside with oral contrast.

## 2019-03-30 NOTE — ED Provider Notes (Signed)
[facility] Emergency Department Provider Note  Time seen: 3:30 PM  I have reviewed the triage vital signs and the nursing notes.   HISTORY  Chief Complaint Abdominal Pain and Emesis   HPI Andrea Dodson is a 68 y.o. female with a past medical history of arthritis, presents to the emergency department for left lower quadrant abdominal pain.  According to the patient she woke around 6:00 this morning due to significant left lower quadrant abdominal pain.  No history of the same.  No history of colitis or diverticulitis.  Denies any urinary symptoms such as dysuria or hematuria.  No history of kidney stones.  Patient does state nausea with an episode of vomiting this morning denies any diarrhea.  Denies any fever cough or shortness of breath.   Past Medical History:  Diagnosis Date  . Allergy   . Arthritis   . Urinary incontinence     Patient Active Problem List   Diagnosis Date Noted  . Salivary gland swelling 02/11/2019  . Environmental and seasonal allergies 08/12/2018  . Hyperlipidemia associated with type 2 diabetes mellitus (HCC) 01/20/2017  . Obesity (BMI 30.0-34.9) 07/11/2016  . Type 2 diabetes mellitus with other specified complication (HCC) 07/11/2016  . Essential hypertension 07/11/2016  . Colon cancer screening 07/11/2016  . H/O seasonal allergies 07/08/2016  . Osteoarthritis of right knee 07/08/2016  . Urinary incontinence 07/08/2016    Past Surgical History:  Procedure Laterality Date  . APPENDECTOMY    . CHOLECYSTECTOMY  1993  . TONSILLECTOMY      Prior to Admission medications   Medication Sig Start Date End Date Taking? Authorizing Provider  aspirin EC 81 MG tablet Take 1 tablet (81 mg total) by mouth daily. 10/17/16   Karamalegos, Alexander J, DO  Blood Glucose Monitoring Suppl (ONE TOUCH ULTRA 2) w/Device KIT 1 device for checking blood sugar once daily 10/17/16   Karamalegos, Alexander J, DO  fluticasone (FLONASE) 50 MCG/ACT nasal  spray Place 2 sprays into both nostrils daily. 01/20/17   Karamalegos, Alexander J, DO  glucose blood (ONE TOUCH ULTRA TEST) test strip CHECK BLOOD SUGAR ONCE DAILY 10/07/17   Karamalegos, Alexander J, DO  Krill Oil 350 MG CAPS Take 350 mg by mouth daily.    [provider]  Lancets (ONETOUCH ULTRASOFT) lancets Use as instructed 10/17/16   Karamalegos, Alexander J, DO  loratadine (CLARITIN) 10 MG tablet Take 1 tablet (10 mg total) by mouth daily. 01/20/17   Karamalegos, Alexander J, DO  losartan (COZAAR) 50 MG tablet TAKE 1 TABLET BY MOUTH  DAILY 09/20/18   Karamalegos, Alexander J, DO  meloxicam (MOBIC) 15 MG tablet TAKE 1/2 TO 1 TABLET BY MOUTH DAILY AS NEEDED FOR PAIN 04/06/18   Karamalegos, Alexander J, DO  metFORMIN (GLUCOPHAGE) 500 MG tablet TAKE 1 TABLET BY MOUTH TWO  TIMES DAILY WITH A MEAL 02/22/19   Karamalegos, Alexander J, DO  rosuvastatin (CRESTOR) 10 MG tablet TAKE 1 TABLET BY MOUTH DAILY 07/04/18   Karamalegos, Alexander J, DO    Allergies  Allergen Reactions  . Codeine Nausea And Vomiting and Other (See Comments)    Nausea vomiting,  Muscle weakness  . Lisinopril Cough    ACEi-cough    Family History  Problem Relation Age of Onset  . Heart failure Mother   . Heart disease Mother   . Diabetes Mother   . Heart attack Mother   . Cancer Father        lung  . Heart attack   Sister   . Diabetes Sister   . Breast cancer Sister 5    Social History Social History   Tobacco Use  . Smoking status: Never Smoker  . Smokeless tobacco: Never Used  Substance Use Topics  . Alcohol use: No  . Drug use: No    Review of Systems Constitutional: Negative for fever. Cardiovascular: Negative for chest pain. Respiratory: Negative for shortness of breath. Gastrointestinal: Positive for left lower quadrant abdominal pain nausea vomiting's morning. Genitourinary: Negative for urinary compaints Musculoskeletal: Negative for musculoskeletal complaints Neurological: Negative for  headache All other ROS negative  ____________________________________________   PHYSICAL EXAM:  VITAL SIGNS: ED Triage Vitals  Enc Vitals Group     BP 03/30/19 1359 (!) 168/82     Pulse Rate 03/30/19 1359 89     Resp 03/30/19 1359 18     Temp 03/30/19 1359 98.6 F (37 C)     Temp Source 03/30/19 1359 Oral     SpO2 03/30/19 1359 100 %     Weight 03/30/19 1402 157 lb (71.2 kg)     Height 03/30/19 1402 5' 2" (1.575 m)     Head Circumference --      Peak Flow --      Pain Score 03/30/19 1401 8     Pain Loc --      Pain Edu? --      Excl. in [place]? --    Constitutional: Alert and oriented. Well appearing and in no distress. Eyes: Normal exam ENT      Head: Normocephalic and atraumatic      Mouth/Throat: Mucous membranes are moist. Cardiovascular: Normal rate, regular rhythm.  Respiratory: Normal respiratory effort without tachypnea nor retractions. Breath sounds are clear  Gastrointestinal: Soft, moderate left lower quadrant tenderness palpation without rebound guarding or distention. Musculoskeletal: Nontender with normal range of motion in all extremities.  Neurologic:  Normal speech and language. No gross focal neurologic deficits Skin:  Skin is warm, dry and intact.  Psychiatric: Mood and affect are normal.   ____________________________________________   RADIOLOGY  Patient has a 4 mm left UVJ stone.  4 mm lung nodule.  ____________________________________________   INITIAL IMPRESSION / ASSESSMENT AND PLAN / ED COURSE  Pertinent labs & imaging results that were available during my care of the patient were reviewed by me and considered in my medical decision making (see chart for details).   Patient presents emergency department for left lower quadrant abdominal pain.  Moderate tenderness to palpation in this area.  Highly suspect colitis or diverticulitis, differential would also include UTI/pyelonephritis or ureterolithiasis.  We will proceed with CT imaging to  further evaluate.  Lab work does show a moderate leukocytosis 13,400, urinalysis pending.  We will treat pain nausea and IV hydrate well awaiting results.  4 mm left UVJ stone.  Discussed lung nodule as well.  We will discharge with Flomax Percocet and Zofran.  Patient follow-up with urology.  Discussed my normal kidney stone return precautions.  Discussed lung nodule and PCP follow-up.  Urinalysis is negative, culture has been sent.  Andrea Dodson was evaluated in Emergency Department on 03/30/2019 for the symptoms described in the history of present illness. She was evaluated in the context of the global COVID-19 pandemic, which necessitated consideration that the patient might be at risk for infection with the SARS-CoV-2 virus that causes COVID-19. Institutional protocols and algorithms that pertain to the evaluation of patients at risk for COVID-19 are in a state of rapid change based  on information released by regulatory bodies including the CDC and federal and state organizations. These policies and algorithms were followed during the patient's care in the ED.  ____________________________________________   FINAL CLINICAL IMPRESSION(S) / ED DIAGNOSES  Left lower quadrant abdominal pain Kidney stone   Harvest Dark, MD 03/30/19 [id number]

## 2019-03-30 NOTE — ED Notes (Signed)
Pt transported to CT ?

## 2019-03-31 LAB — URINE CULTURE

## 2019-04-20 ENCOUNTER — Other Ambulatory Visit: Payer: Self-pay

## 2019-04-20 ENCOUNTER — Encounter: Payer: Self-pay | Admitting: Urology

## 2019-04-20 ENCOUNTER — Ambulatory Visit: Payer: Medicare Other | Admitting: Urology

## 2019-04-20 VITALS — BP 171/81 | HR 77 | Ht 62.0 in | Wt 160.0 lb

## 2019-04-20 DIAGNOSIS — N200 Calculus of kidney: Secondary | ICD-10-CM | POA: Diagnosis not present

## 2019-04-20 LAB — URINALYSIS, COMPLETE
Bilirubin, UA: NEGATIVE
Glucose, UA: NEGATIVE
Ketones, UA: NEGATIVE
Nitrite, UA: NEGATIVE
Protein,UA: NEGATIVE
RBC, UA: NEGATIVE
Specific Gravity, UA: >1.03 — ABNORMAL HIGH (ref 1.005–1.030)
Urobilinogen, Ur: 0.2 mg/dL (ref 0.2–1.0)
pH, UA: 5 (ref 5.0–7.5)

## 2019-04-20 LAB — MICROSCOPIC EXAMINATION: RBC, Urine: NONE SEEN /hpf (ref 0–2)

## 2019-04-20 NOTE — Progress Notes (Signed)
04/20/19 3:27 PM   Andrea Dodson May 14, 1950 [id number]  Referring provider: Olin Hauser, DO [address] [place],  [place] [address]  CC: Nephrolithiasis  HPI: I saw Andrea Dodson today in consultation for nephrolithiasis from Dr. Parks Ranger.  She is a healthy 68 year old female who was seen in the emergency department on 03/30/2019 with acute onset of severe left renal colic.  A CT at that time showed a small 4 mm left distal ureteral stone and a few small punctate left intrarenal calculi.  She was discharged with medical expulsive therapy.  Her pain resolved within 48 hours of discharge, and she passed a number of small stone fragments.  She brought these today and they will be sent for stone analysis.  She denies any gross hematuria, flank pain, or history of nephrolithiasis.   PMH: Past Medical History:  Diagnosis Date  . Allergy   . Arthritis   . Urinary incontinence     Surgical History: Past Surgical History:  Procedure Laterality Date  . APPENDECTOMY    . CHOLECYSTECTOMY  1993  . TONSILLECTOMY      Allergies:  Allergies  Allergen Reactions  . Codeine Nausea And Vomiting and Other (See Comments)    Nausea vomiting,  Muscle weakness  . Lisinopril Cough    ACEi-cough    Family History: Family History  Problem Relation Age of Onset  . Heart failure Mother   . Heart disease Mother   . Diabetes Mother   . Heart attack Mother   . Cancer Father        lung  . Heart attack Sister   . Diabetes Sister   . Breast cancer Sister 65    Social History:  reports that she has never smoked. She has never used smokeless tobacco. She reports that she does not drink alcohol or use drugs.  ROS: Please see flowsheet from today's date for complete review of systems.  Physical Exam: BP (!) 171/81   Pulse 77   Ht 5\' 2"  (1.575 m)   Wt 160 lb (72.6 kg)   BMI 29.26 kg/m    Constitutional:  Alert and oriented, No acute distress. Cardiovascular: No clubbing,  cyanosis, or edema. Respiratory: Normal respiratory effort, no increased work of breathing. GI: Abdomen is soft, nontender, nondistended, no abdominal masses GU: no CVA tenderness Lymph: No cervical or inguinal lymphadenopathy. Skin: No rashes, bruises or suspicious lesions. Neurologic: Grossly intact, no focal deficits, moving all 4 extremities. Psychiatric: Normal mood and affect.  Laboratory Data: Urinalysis today 11-30 WBCs, 0 RBCs, few bacteria, nitrite negative  Pertinent Imaging: I have personally reviewed the CT dated 03/30/2019, see HPI  Assessment & Plan:   In summary, the patient is a healthy 68 year old female with her first episode of nephrolithiasis with a small spontaneously passed 4 mm left distal ureteral stone.  She denies any complaints today and is doing well.  We discussed general stone prevention strategies including adequate hydration with goal of producing 2.5 L of urine daily, increasing citric acid intake, increasing calcium intake during high oxalate meals, minimizing animal protein, and decreasing salt intake. Information about dietary recommendations given today.   Will call with stone analysis results Follow-up as needed  A total of 40 minutes were spent face-to-face with the patient, greater than 50% was spent in patient education, counseling, and coordination of care regarding nephrolithiasis, CT findings, and stone prevention.   Billey Co, MD  [facility] [address], [place], [place]  [address] [phone]   

## 2019-04-20 NOTE — Patient Instructions (Signed)
Dietary Guidelines to Help Prevent Kidney Stones Kidney stones are deposits of minerals and salts that form inside your kidneys. Your risk of developing kidney stones may be greater depending on your diet, your lifestyle, the medicines you take, and whether you have certain medical conditions. Most people can reduce their chances of developing kidney stones by following the instructions below. Depending on your overall health and the type of kidney stones you tend to develop, your dietitian may give you more specific instructions. What are tips for following this plan? Reading food labels  Choose foods with "no salt added" or "low-salt" labels. Limit your sodium intake to less than 1500 mg per day.  Choose foods with calcium for each meal and snack. Try to eat about 300 mg of calcium at each meal. Foods that contain 200-500 mg of calcium per serving include: ? 8 oz (237 ml) of milk, fortified nondairy milk, and fortified fruit juice. ? 8 oz (237 ml) of kefir, yogurt, and soy yogurt. ? 4 oz (118 ml) of tofu. ? 1 oz of cheese. ? 1 cup (300 g) of dried figs. ? 1 cup (91 g) of cooked broccoli. ? 1-3 oz can of sardines or mackerel.  Most people need 1000 to 1500 mg of calcium each day. Talk to your dietitian about how much calcium is recommended for you. Shopping  Buy plenty of fresh fruits and vegetables. Most people do not need to avoid fruits and vegetables, even if they contain nutrients that may contribute to kidney stones.  When shopping for convenience foods, choose: ? Whole pieces of fruit. ? Premade salads with dressing on the side. ? Low-fat fruit and yogurt smoothies.  Avoid buying frozen meals or prepared deli foods.  Look for foods with live cultures, such as yogurt and kefir. Cooking  Do not add salt to food when cooking. Place a salt shaker on the table and allow each person to add his or her own salt to taste.  Use vegetable protein, such as beans, textured vegetable  protein (TVP), or tofu instead of meat in pasta, casseroles, and soups. Meal planning   Eat less salt, if told by your dietitian. To do this: ? Avoid eating processed or premade food. ? Avoid eating fast food.  Eat less animal protein, including cheese, meat, poultry, or fish, if told by your dietitian. To do this: ? Limit the number of times you have meat, poultry, fish, or cheese each week. Eat a diet free of meat at least 2 days a week. ? Eat only one serving each day of meat, poultry, fish, or seafood. ? When you prepare animal protein, cut pieces into small portion sizes. For most meat and fish, one serving is about the size of one deck of cards.  Eat at least 5 servings of fresh fruits and vegetables each day. To do this: ? Keep fruits and vegetables on hand for snacks. ? Eat 1 piece of fruit or a handful of berries with breakfast. ? Have a salad and fruit at lunch. ? Have two kinds of vegetables at dinner.  Limit foods that are high in a substance called oxalate. These include: ? Spinach. ? Rhubarb. ? Beets. ? Potato chips and french fries. ? Nuts.  If you regularly take a diuretic medicine, make sure to eat at least 1-2 fruits or vegetables high in potassium each day. These include: ? Avocado. ? Banana. ? Orange, prune, carrot, or tomato juice. ? Baked potato. ? Cabbage. ? Beans and split   peas. General instructions   Drink enough fluid to keep your urine clear or pale yellow. This is the most important thing you can do.  Talk to your health care provider and dietitian about taking daily supplements. Depending on your health and the cause of your kidney stones, you may be advised: ? Not to take supplements with vitamin C. ? To take a calcium supplement. ? To take a daily probiotic supplement. ? To take other supplements such as magnesium, fish oil, or vitamin B6.  Take all medicines and supplements as told by your health care provider.  Limit alcohol intake to no  more than 1 drink a day for nonpregnant women and 2 drinks a day for men. One drink equals 12 oz of beer, 5 oz of wine, or 1 oz of hard liquor.  Lose weight if told by your health care provider. Work with your dietitian to find strategies and an eating plan that works best for you. What foods are not recommended? Limit your intake of the following foods, or as told by your dietitian. Talk to your dietitian about specific foods you should avoid based on the type of kidney stones and your overall health. Grains Breads. Bagels. Rolls. Baked goods. Salted crackers. Cereal. Pasta. Vegetables Spinach. Rhubarb. Beets. Canned vegetables. Angie Fava. Olives. Meats and other protein foods Nuts. Nut butters. Large portions of meat, poultry, or fish. Salted or cured meats. Deli meats. Hot dogs. Sausages. Dairy Cheese. Beverages Regular soft drinks. Regular vegetable juice. Seasonings and other foods Seasoning blends with salt. Salad dressings. Canned soups. Soy sauce. Ketchup. Barbecue sauce. Canned pasta sauce. Casseroles. Pizza. Lasagna. Frozen meals. Potato chips. [place] fries. Summary  You can reduce your risk of kidney stones by making changes to your diet.  The most important thing you can do is drink enough fluid. You should drink enough fluid to keep your urine clear or pale yellow.  Ask your health care provider or dietitian how much protein from animal sources you should eat each day, and also how much salt and calcium you should have each day. This information is not intended to replace advice given to you by your health care provider. Make sure you discuss any questions you have with your health care provider. Document Released: 08/02/2010 Document Revised: 07/28/2018 Document Reviewed: 03/18/2016 Elsevier Patient Education  2020 [employer].

## 2019-04-26 ENCOUNTER — Other Ambulatory Visit: Payer: Self-pay | Admitting: Urology

## 2019-05-31 LAB — MICROALBUMIN, URINE: Microalb, Ur: NEGATIVE

## 2019-05-31 LAB — HEMOGLOBIN A1C: Hemoglobin A1C: 6.7

## 2019-06-13 ENCOUNTER — Telehealth: Payer: Self-pay | Admitting: Family Medicine

## 2019-06-13 NOTE — Chronic Care Management (AMB) (Signed)
  Chronic Care Management   Note  06/13/2019 Name: Andrea Dodson MRN: [id number] DOB: 31-Dec-1950  Andrea Dodson is a 69 y.o. year old female who is a primary care patient of Olin Hauser, DO. I reached out to Andrea Dodson by phone today in response to a referral sent by Andrea Dodson health plan.     Andrea Dodson was given information about Chronic Care Management services today including:  1. CCM service includes personalized support from designated clinical staff supervised by her physician, including individualized plan of care and coordination with other care providers 2. 24/7 contact phone numbers for assistance for urgent and routine care needs. 3. Service will only be billed when office clinical staff spend 20 minutes or more in a month to coordinate care. 4. Only one practitioner may furnish and bill the service in a calendar month. 5. The patient may stop CCM services at any time (effective at the end of the month) by phone call to the office staff. 6. The patient will be responsible for cost sharing (co-pay) of up to 20% of the service fee (after annual deductible is met).  Patient agreed to services and verbal consent obtained.   Follow up plan: Telephone appointment with care management team member scheduled for:07/21/2019  Noreene Larsson, [place], [place], [place] [address] Direct Dial: [phone] [email or web address] Website: [email or web address]

## 2019-06-15 DIAGNOSIS — E119 Type 2 diabetes mellitus without complications: Secondary | ICD-10-CM | POA: Diagnosis not present

## 2019-06-15 LAB — HM DIABETES EYE EXAM

## 2019-06-16 ENCOUNTER — Encounter: Payer: Self-pay | Admitting: Family Medicine

## 2019-06-20 ENCOUNTER — Other Ambulatory Visit: Payer: Self-pay | Admitting: Family Medicine

## 2019-06-20 DIAGNOSIS — E1169 Type 2 diabetes mellitus with other specified complication: Secondary | ICD-10-CM

## 2019-07-21 ENCOUNTER — Telehealth: Payer: Medicare Other | Admitting: General Practice

## 2019-07-21 ENCOUNTER — Ambulatory Visit (INDEPENDENT_AMBULATORY_CARE_PROVIDER_SITE_OTHER): Payer: Medicare Other | Admitting: General Practice

## 2019-07-21 DIAGNOSIS — E1169 Type 2 diabetes mellitus with other specified complication: Secondary | ICD-10-CM | POA: Diagnosis not present

## 2019-07-21 DIAGNOSIS — I10 Essential (primary) hypertension: Secondary | ICD-10-CM

## 2019-07-21 DIAGNOSIS — E785 Hyperlipidemia, unspecified: Secondary | ICD-10-CM | POA: Diagnosis not present

## 2019-07-21 NOTE — Patient Instructions (Signed)
Visit Information  Goals Addressed            This Visit's Progress   . RNCM: Pt: "I haven't checked my blood pressure lately" (pt-stated)       CARE PLAN ENTRY (see longtitudinal plan of care for additional care plan information)  Current Barriers:  . Chronic Disease Management support, education, and care coordination needs related to HTN, HLD, and DMII  Clinical Goal(s) related to HTN, HLD, and DMII:  Over the next 120 days, patient will:  . Work with the care management team to address educational, disease management, and care coordination needs  . Begin or continue self health monitoring activities as directed today Measure and record cbg (blood glucose) 1 times daily, Measure and record blood pressure 2 times per week, and adhere to a heart healthy/ADA diet . Call provider office for new or worsened signs and symptoms Blood glucose findings outside established parameters, Blood pressure findings outside established parameters, and New or worsened symptom related to HLD and other chronic conditions . Call care management team with questions or concerns . Verbalize basic understanding of patient centered plan of care established today  Interventions related to HTN, HLD, and DMII:  . Evaluation of current treatment plans and patient's adherence to plan as established by provider . Assessed patient understanding of disease states . Assessed patient's education and care coordination needs . Review of upcoming appointments: appointment with pcp on 4/15.  Patient will have cataract removal on 4-19 . Provided disease specific education to patient: review of heart healthy/ADA diet  . Collaborated with appropriate clinical care team members regarding patient needs- has no needs at this time but knows pharmacist and LCSW are available to help if needed.   Patient Self Care Activities related to HTN, HLD, and DMII:  . Patient is unable to independently self-manage chronic health  conditions  Initial goal documentation        Ms. Blodgett was given information about Chronic Care Management services today including:  1. CCM service includes personalized support from designated clinical staff supervised by her physician, including individualized plan of care and coordination with other care providers 2. 24/7 contact phone numbers for assistance for urgent and routine care needs. 3. Service will only be billed when office clinical staff spend 20 minutes or more in a month to coordinate care. 4. Only one practitioner may furnish and bill the service in a calendar month. 5. The patient may stop CCM services at any time (effective at the end of the month) by phone call to the office staff. 6. The patient will be responsible for cost sharing (co-pay) of up to 20% of the service fee (after annual deductible is met).  Patient agreed to services and verbal consent obtained.   Patient verbalizes understanding of instructions provided today.   The care management team will reach out to the patient again over the next 60 days.   Noreene Larsson RN, MSN, [place] [facility] Mobile: [phone]

## 2019-07-21 NOTE — Chronic Care Management (AMB) (Signed)
Chronic Care Management   Initial Visit Note  07/21/2019 Name: Andrea Dodson MRN: [id number] DOB: [birth year]  Referred by: Olin Hauser, DO Reason for referral : Chronic Care Management (Initial Chronic Disease Management and care coordination needs)   Andrea Dodson is a 69 y.o. year old female who is a primary care patient of Olin Hauser, DO. The CCM team was consulted for assistance with chronic disease management and care coordination needs related to HTN, HLD and DMII  Review of patient status, including review of consultants reports, relevant laboratory and other test results, and collaboration with appropriate care team members and the patient's provider was performed as part of comprehensive patient evaluation and provision of chronic care management services.    SDOH (Social Determinants of Health) assessments performed: Yes See Care Plan activities for detailed interventions related to SDOH  SDOH Interventions     Most Recent Value  SDOH Interventions  SDOH Interventions for the Following Domains  Physical Activity [the patient states she stays  busy but no structured activity]  Physical Activity Interventions  Other (Comments) [the patient works in her yard but does not do any type of structured activity]  Social Connections Interventions  Other (Comment) [has a good support system]       Medications: Outpatient Encounter Medications as of 07/21/2019  Medication Sig  . aspirin EC 81 MG tablet Take 1 tablet (81 mg total) by mouth daily.  . Blood Glucose Monitoring Suppl (ONE TOUCH ULTRA 2) w/Device KIT 1 device for checking blood sugar once daily  . fluticasone (FLONASE) 50 MCG/ACT nasal spray Place 2 sprays into both nostrils daily.  Marland Kitchen glucose blood (ONE TOUCH ULTRA TEST) test strip CHECK BLOOD SUGAR ONCE DAILY  . Krill Oil 350 MG CAPS Take 350 mg by mouth daily.  . Lancets (ONETOUCH ULTRASOFT) lancets Use as instructed  . loratadine (CLARITIN) 10  MG tablet Take 1 tablet (10 mg total) by mouth daily.  Marland Kitchen losartan (COZAAR) 50 MG tablet TAKE 1 TABLET BY MOUTH  DAILY  . meloxicam (MOBIC) 15 MG tablet TAKE 1/2 TO 1 TABLET BY MOUTH DAILY AS NEEDED FOR PAIN  . metFORMIN (GLUCOPHAGE) 500 MG tablet TAKE 1 TABLET BY MOUTH TWO  TIMES DAILY WITH A MEAL  . rosuvastatin (CRESTOR) 10 MG tablet TAKE 1 TABLET BY MOUTH DAILY  . tamsulosin (FLOMAX) 0.4 MG CAPS capsule Take 1 capsule (0.4 mg total) by mouth daily.   No facility-administered encounter medications on file as of 07/21/2019.     Objective:   Goals Addressed            This Visit's Progress   . RNCM: Pt: "I haven't checked my blood pressure lately" (pt-stated)       CARE PLAN ENTRY (see longtitudinal plan of care for additional care plan information)  Current Barriers:  . Chronic Disease Management support, education, and care coordination needs related to HTN, HLD, and DMII  Clinical Goal(s) related to HTN, HLD, and DMII:  Over the next 120 days, patient will:  . Work with the care management team to address educational, disease management, and care coordination needs  . Begin or continue self health monitoring activities as directed today Measure and record cbg (blood glucose) 1 times daily, Measure and record blood pressure 2 times per week, and adhere to a heart healthy/ADA diet . Call provider office for new or worsened signs and symptoms Blood glucose findings outside established parameters, Blood pressure findings outside established parameters, and New or worsened  symptom related to HLD and other chronic conditions . Call care management team with questions or concerns . Verbalize basic understanding of patient centered plan of care established today  Interventions related to HTN, HLD, and DMII:  . Evaluation of current treatment plans and patient's adherence to plan as established by provider . Assessed patient understanding of disease states . Assessed patient's education  and care coordination needs . Review of upcoming appointments: appointment with pcp on 4/15.  Patient will have cataract removal on 4-19 . Provided disease specific education to patient: review of heart healthy/ADA diet  . Collaborated with appropriate clinical care team members regarding patient needs- has no needs at this time but knows pharmacist and LCSW are available to help if needed.   Patient Self Care Activities related to HTN, HLD, and DMII:  . Patient is unable to independently self-manage chronic health conditions  Initial goal documentation         Ms. Pandey was given information about Chronic Care Management services today including:  1. CCM service includes personalized support from designated clinical staff supervised by her physician, including individualized plan of care and coordination with other care providers 2. 24/7 contact phone numbers for assistance for urgent and routine care needs. 3. Service will only be billed when office clinical staff spend 20 minutes or more in a month to coordinate care. 4. Only one practitioner may furnish and bill the service in a calendar month. 5. The patient may stop CCM services at any time (effective at the end of the month) by phone call to the office staff. 6. The patient will be responsible for cost sharing (co-pay) of up to 20% of the service fee (after annual deductible is met).  Patient agreed to services and verbal consent obtained.   Plan:   The care management team will reach out to the patient again over the next 60 days.   Noreene Larsson RN, MSN, [place] Mobile: [phone]

## 2019-07-25 ENCOUNTER — Other Ambulatory Visit: Payer: Self-pay | Admitting: Family Medicine

## 2019-07-25 ENCOUNTER — Encounter: Payer: Self-pay | Admitting: Family Medicine

## 2019-07-25 ENCOUNTER — Ambulatory Visit (INDEPENDENT_AMBULATORY_CARE_PROVIDER_SITE_OTHER): Payer: Medicare Other | Admitting: Family Medicine

## 2019-07-25 ENCOUNTER — Other Ambulatory Visit: Payer: Self-pay

## 2019-07-25 VITALS — BP 132/46 | HR 72 | Temp 97.7°F | Resp 16 | Ht 62.0 in | Wt 159.4 lb

## 2019-07-25 DIAGNOSIS — E1159 Type 2 diabetes mellitus with other circulatory complications: Secondary | ICD-10-CM | POA: Diagnosis not present

## 2019-07-25 DIAGNOSIS — Z0000 Encounter for general adult medical examination without abnormal findings: Secondary | ICD-10-CM

## 2019-07-25 DIAGNOSIS — E1169 Type 2 diabetes mellitus with other specified complication: Secondary | ICD-10-CM

## 2019-07-25 DIAGNOSIS — H2512 Age-related nuclear cataract, left eye: Secondary | ICD-10-CM | POA: Diagnosis not present

## 2019-07-25 DIAGNOSIS — I10 Essential (primary) hypertension: Secondary | ICD-10-CM

## 2019-07-25 DIAGNOSIS — E663 Overweight: Secondary | ICD-10-CM | POA: Diagnosis not present

## 2019-07-25 LAB — POCT GLYCOSYLATED HEMOGLOBIN (HGB A1C): Hemoglobin A1C: 7.1 % — AB (ref 4.0–5.6)

## 2019-07-25 NOTE — Patient Instructions (Addendum)
Thank you for coming to the office today.  Try adjusting Metformin dose. 500mg  tablets, can start with 1 in AM and 2 in PM if you prefer, that way if miss in AM dose - would have double coverage in PM.  In future we can switch to XR or 24 hour release.  ----------------------------------------   After upcoming cataract surgery then work on scheduling vaccine:  [place]:  [facility]) [place] [address]  Hours: Monday - Sunday 8:00am to 12:00pm  COVID-19 Vaccines By Appointment Only  Sign up for [place] List  [email or web address] or text "VACCINE" to [phone] or call [phone]   DUE for FASTING BLOOD WORK (no food or drink after midnight before the lab appointment, only water or coffee without cream/sugar on the morning of)  SCHEDULE "Lab Only" visit in the morning at the clinic for lab draw in 6 MONTHS   - Make sure Lab Only appointment is at about 1 week before your next appointment, so that results will be available  For Lab Results, once available within 2-3 days of blood draw, you can can log in to MyChart online to view your results and a brief explanation. Also, we can discuss results at next follow-up visit.    Please schedule a Follow-up Appointment to: Return in about 6 months (around 01/24/2020) for Annual Physical.  If you have any other questions or concerns, please feel free to call the office or send a message through [place]. You may also schedule an earlier appointment if necessary.  Additionally, you may be receiving a survey about your experience at our office within a few days to 1 week by e-mail or mail. We value your feedback.  [name], DO [place]

## 2019-07-25 NOTE — Progress Notes (Signed)
Subjective:    Patient ID: Andrea Dodson, female    DOB: Mar 04, 1951, 69 y.o.   MRN: [id number]  Andrea Dodson is a 69 y.o. female presenting on 07/25/2019 for Diabetes   HPI   CHRONIC DM, Type 2 / Overweight BMI >29 Reports some concern with lifestyle. Had prior avg 6.5 range now last reading was 7.0 due for A1c today CBGs:stable readings Meds: Metformin 500mg  BID Reportsimprovedcompliance but still misses AM metformin dose fairly often. Tolerating well w/o side-effects CurrentlyARB/ASA daily Lifestyle: - Weightstable to improved - Diet improved DM diet. Largest mea of day is afternoon/lunch, Occasional sweets - Exercise (walking now goal to resume exercise) Denies hypoglycemia  CHRONIC HTN: Reportsno new concerns.Home BP normal Current Meds -Losartan 50mg  daily - Prior history w/ ACEi cough on Lisinopril Reports good compliance, took meds today. Tolerating well, w/o complaints.   Health Maintenance:   Already did HH FIT Test - negative earlier 2021. She had prior Cologuard 07/21/16 - negative. Now due again in 2021 for repeat Cologuard.   Depression screen [facility] 2/9 07/25/2019 07/21/2019 02/15/2019  Decreased Interest 0 0 0  Down, Depressed, Hopeless 0 0 0  PHQ - 2 Score 0 0 0    Social History   Tobacco Use  . Smoking status: Never Smoker  . Smokeless tobacco: Never Used  Substance Use Topics  . Alcohol use: No  . Drug use: No    Review of Systems Per HPI unless specifically indicated above     Objective:    BP (!) 132/46   Pulse 72   Temp 97.7 F (36.5 C) (Temporal)   Resp 16   Ht 5\' 2"  (1.575 m)   Wt 159 lb 6.4 oz (72.3 kg)   BMI 29.15 kg/m   Wt Readings from Last 3 Encounters:  07/25/19 159 lb 6.4 oz (72.3 kg)  04/20/19 160 lb (72.6 kg)  03/30/19 157 lb (71.2 kg)    Physical Exam Vitals and nursing note reviewed.  Constitutional:      General: She is not in acute distress.    Appearance: She is well-developed. She is obese. She is  not diaphoretic.     Comments: Well-appearing, comfortable, cooperative  HENT:     Head: Normocephalic and atraumatic.  Eyes:     General:        Right eye: No discharge.        Left eye: No discharge.     Conjunctiva/sclera: Conjunctivae normal.  Cardiovascular:     Rate and Rhythm: Normal rate.  Pulmonary:     Effort: Pulmonary effort is normal.  Skin:    General: Skin is warm and dry.     Findings: No erythema or rash.  Neurological:     Mental Status: She is alert and oriented to person, place, and time.  Psychiatric:        Behavior: Behavior normal.     Comments: Well groomed, good eye contact, normal speech and thoughts      Recent Labs    08/05/18 0804 02/03/19 1102 07/25/19 1404  HGBA1C 6.5* 7.0* 7.1*    Results for orders placed or performed in visit on 07/25/19  POCT HgB A1C  Result Value Ref Range   Hemoglobin A1C 7.1 (A) 4.0 - 5.6 %      Assessment & Plan:   Problem List Items Addressed This Visit    Type 2 diabetes mellitus with other specified complication ([place]) - Primary    Controlled, slightly elevated A1c from 7  up to 7.1 now virtually stable - now improving lifestyle No hypoglycemia Complications - other including hyperlipidemia, obesity - increases risk of future cardiovascular complications   Plan:  1. INCREASE Metformin now to 500mg  AM and 1000mg  PM - can adjust dosing regimen in future, may consider stop AM dose and only do PM dose we can switch to XR dosing as well 2. Encourage improved lifestyle - low carb, low sugar diet, reduce portion size, continue improving regular exercise 3. Check CBG, bring log to next visit for review 4. Continue ASA, ARB, Statin      Relevant Orders   POCT HgB A1C (Completed)   Overweight (BMI 25.0-29.9)    Wt loss and improvement Encourage lifestyle diet exercise      Essential hypertension    Controlled Home readings infrequent but controlled No known complications  Failed: Lisinopril (ACEi  cough)    Plan:  1. Continue current BP regimen Losartan 50mg  daily 2. Encourage improved lifestyle - low sodium diet, regular exercise 3. Continue monitor BP outside office, bring readings to next visit, if persistently >140/90 or new symptoms notify office sooner         No orders of the defined types were placed in this encounter.    Follow up plan: Return in about 6 months (around 01/24/2020) for Annual Physical.  Future labs ordered for 01/24/20 add TSH  [name], [facility] 07/25/2019, 2:04 PM

## 2019-07-25 NOTE — Assessment & Plan Note (Signed)
Controlled, slightly elevated A1c from 7 up to 7.1 now virtually stable - now improving lifestyle No hypoglycemia Complications - other including hyperlipidemia, obesity - increases risk of future cardiovascular complications   Plan:  1. INCREASE Metformin now to 500mg  AM and 1000mg  PM - can adjust dosing regimen in future, may consider stop AM dose and only do PM dose we can switch to XR dosing as well 2. Encourage improved lifestyle - low carb, low sugar diet, reduce portion size, continue improving regular exercise 3. Check CBG, bring log to next visit for review 4. Continue ASA, ARB, Statin

## 2019-07-25 NOTE — Assessment & Plan Note (Signed)
Wt loss and improvement Encourage lifestyle diet exercise

## 2019-07-25 NOTE — Assessment & Plan Note (Signed)
Controlled Home readings infrequent but controlled No known complications  Failed: Lisinopril (ACEi cough)    Plan:  1. Continue current BP regimen Losartan 50mg daily 2. Encourage improved lifestyle - low sodium diet, regular exercise 3. Continue monitor BP outside office, bring readings to next visit, if persistently >140/90 or new symptoms notify office sooner 

## 2019-07-28 ENCOUNTER — Other Ambulatory Visit: Payer: Self-pay

## 2019-07-28 ENCOUNTER — Encounter: Payer: Self-pay | Admitting: Ophthalmology

## 2019-08-01 ENCOUNTER — Other Ambulatory Visit: Payer: Self-pay | Admitting: Family Medicine

## 2019-08-01 DIAGNOSIS — E119 Type 2 diabetes mellitus without complications: Secondary | ICD-10-CM

## 2019-08-01 DIAGNOSIS — I10 Essential (primary) hypertension: Secondary | ICD-10-CM

## 2019-08-03 ENCOUNTER — Encounter: Payer: Self-pay | Admitting: Family Medicine

## 2019-08-04 ENCOUNTER — Other Ambulatory Visit
Admission: RE | Admit: 2019-08-04 | Discharge: 2019-08-04 | Disposition: A | Discharge status: Home / Self Care | Payer: Medicare Other | Source: Ambulatory Visit | Attending: Ophthalmology | Admitting: Ophthalmology

## 2019-08-04 ENCOUNTER — Other Ambulatory Visit: Payer: Self-pay

## 2019-08-04 ENCOUNTER — Ambulatory Visit: Payer: Medicare Other | Admitting: Family Medicine

## 2019-08-04 DIAGNOSIS — Z01812 Encounter for preprocedural laboratory examination: Secondary | ICD-10-CM | POA: Diagnosis not present

## 2019-08-04 DIAGNOSIS — Z20822 Contact with and (suspected) exposure to covid-19: Secondary | ICD-10-CM | POA: Diagnosis not present

## 2019-08-04 LAB — SARS CORONAVIRUS 2 (TAT 6-24 HRS): SARS Coronavirus 2: NEGATIVE

## 2019-08-04 NOTE — Discharge Instructions (Signed)

## 2019-08-08 ENCOUNTER — Encounter: Payer: Self-pay | Admitting: Ophthalmology

## 2019-08-08 ENCOUNTER — Encounter
Admission: RE | Disposition: A | Discharge status: Home / Self Care | Payer: Self-pay | Source: Home / Self Care | Attending: Ophthalmology

## 2019-08-08 ENCOUNTER — Ambulatory Visit: Payer: Medicare Other | Admitting: Anesthesiology

## 2019-08-08 ENCOUNTER — Other Ambulatory Visit: Payer: Self-pay

## 2019-08-08 ENCOUNTER — Ambulatory Visit
Admission: RE | Admit: 2019-08-08 | Discharge: 2019-08-08 | Disposition: A | Discharge status: Home / Self Care | Payer: Medicare Other | Attending: Ophthalmology | Admitting: Ophthalmology

## 2019-08-08 DIAGNOSIS — E1136 Type 2 diabetes mellitus with diabetic cataract: Secondary | ICD-10-CM | POA: Diagnosis not present

## 2019-08-08 DIAGNOSIS — M1990 Unspecified osteoarthritis, unspecified site: Secondary | ICD-10-CM | POA: Diagnosis not present

## 2019-08-08 DIAGNOSIS — Z7982 Long term (current) use of aspirin: Secondary | ICD-10-CM | POA: Insufficient documentation

## 2019-08-08 DIAGNOSIS — Z79899 Other long term (current) drug therapy: Secondary | ICD-10-CM | POA: Diagnosis not present

## 2019-08-08 DIAGNOSIS — Z885 Allergy status to narcotic agent status: Secondary | ICD-10-CM | POA: Insufficient documentation

## 2019-08-08 DIAGNOSIS — H2512 Age-related nuclear cataract, left eye: Secondary | ICD-10-CM | POA: Diagnosis not present

## 2019-08-08 DIAGNOSIS — I10 Essential (primary) hypertension: Secondary | ICD-10-CM | POA: Insufficient documentation

## 2019-08-08 DIAGNOSIS — H25812 Combined forms of age-related cataract, left eye: Secondary | ICD-10-CM | POA: Diagnosis not present

## 2019-08-08 DIAGNOSIS — Z7984 Long term (current) use of oral hypoglycemic drugs: Secondary | ICD-10-CM | POA: Diagnosis not present

## 2019-08-08 HISTORY — DX: Essential (primary) hypertension: I10

## 2019-08-08 HISTORY — DX: Type 2 diabetes mellitus without complications: E11.9

## 2019-08-08 HISTORY — DX: Presence of dental prosthetic device (complete) (partial): Z97.2

## 2019-08-08 HISTORY — PX: CATARACT EXTRACTION W/PHACO: SHX586

## 2019-08-08 LAB — GLUCOSE, CAPILLARY
Glucose-Capillary: 153 mg/dL — ABNORMAL HIGH (ref 70–99)
Glucose-Capillary: 134 mg/dL — ABNORMAL HIGH (ref 70–99)

## 2019-08-08 SURGERY — PHACOEMULSIFICATION, CATARACT, WITH IOL INSERTION
Anesthesia: Monitor Anesthesia Care | Site: Eye | Laterality: Left

## 2019-08-08 MED ORDER — ACETAMINOPHEN 325 MG PO TABS: 325.0000 mg | ORAL_TABLET | ORAL | Status: DC | PRN

## 2019-08-08 MED ORDER — ONDANSETRON HCL 4 MG/2ML IJ SOLN: 4.0000 mg | Freq: Once | INTRAMUSCULAR | Status: DC | PRN

## 2019-08-08 MED ORDER — LIDOCAINE HCL (PF) 2 % IJ SOLN
INTRAOCULAR | Status: DC | PRN
  Administered 2019-08-08: 08:00:00 1 mL via INTRAOCULAR

## 2019-08-08 MED ORDER — EPINEPHRINE PF 1 MG/ML IJ SOLN
INTRAOCULAR | Status: DC | PRN
  Administered 2019-08-08: 63 mL via OPHTHALMIC

## 2019-08-08 MED ORDER — MOXIFLOXACIN HCL 0.5 % OP SOLN
OPHTHALMIC | Status: DC | PRN
  Administered 2019-08-08: 0.2 mL via OPHTHALMIC

## 2019-08-08 MED ORDER — TETRACAINE HCL 0.5 % OP SOLN
1.0000 [drp] | OPHTHALMIC | Status: DC | PRN
  Administered 2019-08-08 (×3): 1 [drp] via OPHTHALMIC

## 2019-08-08 MED ORDER — FENTANYL CITRATE (PF) 100 MCG/2ML IJ SOLN
INTRAMUSCULAR | Status: DC | PRN
  Administered 2019-08-08 (×2): 50 ug via INTRAVENOUS

## 2019-08-08 MED ORDER — SODIUM HYALURONATE 10 MG/ML IO SOLN
INTRAOCULAR | Status: DC | PRN
  Administered 2019-08-08: 0.55 mL via INTRAOCULAR

## 2019-08-08 MED ORDER — ARMC OPHTHALMIC DILATING DROPS
1.0000 "application " | OPHTHALMIC | Status: DC | PRN
  Administered 2019-08-08 (×3): 1 via OPHTHALMIC

## 2019-08-08 MED ORDER — MIDAZOLAM HCL 2 MG/2ML IJ SOLN
INTRAMUSCULAR | Status: DC | PRN
  Administered 2019-08-08: 2 mg via INTRAVENOUS

## 2019-08-08 MED ORDER — ONDANSETRON HCL 4 MG/2ML IJ SOLN
INTRAMUSCULAR | Status: DC | PRN
  Administered 2019-08-08: 4 mg via INTRAVENOUS

## 2019-08-08 MED ORDER — SODIUM HYALURONATE 23 MG/ML IO SOLN
INTRAOCULAR | Status: DC | PRN
  Administered 2019-08-08: 0.6 mL via INTRAOCULAR

## 2019-08-08 MED ORDER — ACETAMINOPHEN 160 MG/5ML PO SOLN: 325.0000 mg | ORAL | Status: DC | PRN

## 2019-08-08 SURGICAL SUPPLY — 19 items
CANNULA ANT/CHMB 27G (MISCELLANEOUS) ×2 IMPLANT
CANNULA ANT/CHMB 27GA (MISCELLANEOUS) ×4 IMPLANT
DISSECTOR HYDRO NUCLEUS 50X22 (MISCELLANEOUS) ×2 IMPLANT
GLOVE SURG LX 7.5 STRW (GLOVE) ×1
GLOVE SURG LX STRL 7.5 STRW (GLOVE) ×1 IMPLANT
GLOVE SURG SYN 8.5  E (GLOVE) ×1
GLOVE SURG SYN 8.5 E (GLOVE) ×1 IMPLANT
GLOVE SURG SYN 8.5 PF PI (GLOVE) ×1 IMPLANT
GOWN STRL REUS W/ TWL LRG LVL3 (GOWN DISPOSABLE) ×2 IMPLANT
GOWN STRL REUS W/TWL LRG LVL3 (GOWN DISPOSABLE) ×2
LENS IOL TECNIS ITEC 18.5 (Intraocular Lens) ×1 IMPLANT
MARKER SKIN DUAL TIP RULER LAB (MISCELLANEOUS) ×2 IMPLANT
PACK DR. KING ARMS (PACKS) ×2 IMPLANT
PACK EYE AFTER SURG (MISCELLANEOUS) ×2 IMPLANT
PACK OPTHALMIC (MISCELLANEOUS) ×2 IMPLANT
SYR 3ML LL SCALE MARK (SYRINGE) ×2 IMPLANT
SYR TB 1ML LUER SLIP (SYRINGE) ×2 IMPLANT
WATER STERILE IRR 250ML POUR (IV SOLUTION) ×2 IMPLANT
WIPE NON LINTING 3.25X3.25 (MISCELLANEOUS) ×2 IMPLANT

## 2019-08-08 NOTE — Anesthesia Postprocedure Evaluation (Addendum)
Anesthesia Post Note  Patient: Andrea Dodson  Procedure(s) Performed: CATARACT EXTRACTION PHACO AND INTRAOCULAR LENS PLACEMENT (IOC) LEFT 4.59  00:32.3 (Left Eye)     Patient location during evaluation: PACU Anesthesia Type: MAC Level of consciousness: awake and alert Pain management: pain level controlled Vital Signs Assessment: post-procedure vital signs reviewed and stable Respiratory status: spontaneous breathing Cardiovascular status: blood pressure returned to baseline Postop Assessment: no apparent nausea or vomiting, adequate PO intake and no headache Anesthetic complications: no    Adele Barthel Salvatrice Morandi

## 2019-08-08 NOTE — Anesthesia Procedure Notes (Signed)
Procedure Name: MAC Date/Time: 08/08/2019 7:36 AM Performed by: Jeannene Patella, CRNA Pre-anesthesia Checklist: Patient identified, Emergency Drugs available, Suction available, Patient being monitored and Timeout performed Patient Re-evaluated:Patient Re-evaluated prior to induction Oxygen Delivery Method: Nasal cannula

## 2019-08-08 NOTE — Anesthesia Preprocedure Evaluation (Signed)
Anesthesia Evaluation  Patient identified by MRN, date of birth, ID band Patient awake    History of Anesthesia Complications Negative for: history of anesthetic complications  Airway Mallampati: II  TM Distance: >3 FB Neck ROM: Full    Dental  (+) Lower Dentures, Upper Dentures   Pulmonary neg pulmonary ROS,    Pulmonary exam normal        Cardiovascular Exercise Tolerance: Good hypertension, Pt. on medications Normal cardiovascular exam     Neuro/Psych negative neurological ROS     GI/Hepatic negative GI ROS, Neg liver ROS,   Endo/Other  diabetes, Well Controlled, Type 2  Renal/GU      Musculoskeletal   Abdominal   Peds negative pediatric ROS (+)  Hematology negative hematology ROS (+)   Anesthesia Other Findings   Reproductive/Obstetrics                             Anesthesia Physical Anesthesia Plan  ASA: II  Anesthesia Plan: MAC   Post-op Pain Management:    Induction: Intravenous  PONV Risk Score and Plan: 2 and Treatment may vary due to age or medical condition and Midazolam  Airway Management Planned: Nasal Cannula and Natural Airway  Additional Equipment: None  Intra-op Plan:   Post-operative Plan:   Informed Consent: I have reviewed the patients History and Physical, chart, labs and discussed the procedure including the risks, benefits and alternatives for the proposed anesthesia with the patient or authorized representative who has indicated his/her understanding and acceptance.       Plan Discussed with: CRNA  Anesthesia Plan Comments:         Anesthesia Quick Evaluation

## 2019-08-08 NOTE — Op Note (Signed)
OPERATIVE NOTE  Anasha Bilbrey [id number] 08/08/2019   PREOPERATIVE DIAGNOSIS:  Nuclear sclerotic cataract left eye.  H25.12   POSTOPERATIVE DIAGNOSIS:    Nuclear sclerotic cataract left eye.     PROCEDURE:  Phacoemusification with posterior chamber intraocular lens placement of the left eye   LENS:   Implant Name Type Inv. Item Serial No. Manufacturer Lot No. LRB No. Used Action  LENS IOL DIOP 18.5 - [id number] Intraocular Lens LENS IOL DIOP 18.5 [id number]  Left 1 Implanted      Procedure(s) with comments: CATARACT EXTRACTION PHACO AND INTRAOCULAR LENS PLACEMENT (IOC) LEFT 4.59  00:32.3 (Left) - Diabetic - oral meds  PCB00 +18.5   ULTRASOUND TIME: 0 minutes 32 seconds.  CDE 4.59   SURGEON:  Benay Pillow, MD, MPH   ANESTHESIA:  Topical with tetracaine drops augmented with 1% preservative-free intracameral lidocaine.  ESTIMATED BLOOD LOSS: <1 mL   COMPLICATIONS:  None.   DESCRIPTION OF PROCEDURE:  The patient was identified in the holding room and transported to the operating room and placed in the supine position under the operating microscope.  The left eye was identified as the operative eye and it was prepped and draped in the usual sterile ophthalmic fashion.   A 1.0 millimeter clear-corneal paracentesis was made at the 5:00 position. 0.5 ml of preservative-free 1% lidocaine with epinephrine was injected into the anterior chamber.  The anterior chamber was filled with Healon 5 viscoelastic.  A 2.4 millimeter keratome was used to make a near-clear corneal incision at the 2:00 position.  A curvilinear capsulorrhexis was made with a cystotome and capsulorrhexis forceps.  Balanced salt solution was used to hydrodissect and hydrodelineate the nucleus.   Phacoemulsification was then used in stop and chop fashion to remove the lens nucleus and epinucleus.  The remaining cortex was then removed using the irrigation and aspiration handpiece. Healon was then placed into the  capsular bag to distend it for lens placement.  A lens was then injected into the capsular bag.  The remaining viscoelastic was aspirated.   Wounds were hydrated with balanced salt solution.  The anterior chamber was inflated to a physiologic pressure with balanced salt solution.  Intracameral vigamox 0.1 mL undiltued was injected into the eye and a drop placed onto the ocular surface.  No wound leaks were noted.  The patient was taken to the recovery room in stable condition without complications of anesthesia or surgery  Benay Pillow 08/08/2019, 7:51 AM

## 2019-08-08 NOTE — Transfer of Care (Signed)
Immediate Anesthesia Transfer of Care Note  Patient: Andrea Dodson  Procedure(s) Performed: CATARACT EXTRACTION PHACO AND INTRAOCULAR LENS PLACEMENT (IOC) LEFT 4.59  00:32.3 (Left Eye)  Patient Location: PACU  Anesthesia Type: MAC  Level of Consciousness: awake, alert  and patient cooperative  Airway and Oxygen Therapy: Patient Spontanous Breathing and Patient connected to supplemental oxygen  Post-op Assessment: Post-op Vital signs reviewed, Patient's Cardiovascular Status Stable, Respiratory Function Stable, Patent Airway and No signs of Nausea or vomiting  Post-op Vital Signs: Reviewed and stable  Complications: No apparent anesthesia complications

## 2019-08-08 NOTE — H&P (Signed)

## 2019-08-09 ENCOUNTER — Encounter: Payer: Self-pay | Admitting: *Deleted

## 2019-08-26 DIAGNOSIS — H2511 Age-related nuclear cataract, right eye: Secondary | ICD-10-CM | POA: Diagnosis not present

## 2019-08-26 DIAGNOSIS — I10 Essential (primary) hypertension: Secondary | ICD-10-CM | POA: Diagnosis not present

## 2019-08-29 ENCOUNTER — Encounter: Payer: Self-pay | Admitting: Ophthalmology

## 2019-08-29 ENCOUNTER — Other Ambulatory Visit: Payer: Self-pay

## 2019-09-01 ENCOUNTER — Other Ambulatory Visit: Payer: Self-pay

## 2019-09-01 ENCOUNTER — Other Ambulatory Visit
Admission: RE | Admit: 2019-09-01 | Discharge: 2019-09-01 | Disposition: A | Discharge status: Home / Self Care | Payer: Medicare Other | Source: Ambulatory Visit | Attending: Ophthalmology | Admitting: Ophthalmology

## 2019-09-01 DIAGNOSIS — Z20822 Contact with and (suspected) exposure to covid-19: Secondary | ICD-10-CM | POA: Insufficient documentation

## 2019-09-01 DIAGNOSIS — Z01812 Encounter for preprocedural laboratory examination: Secondary | ICD-10-CM | POA: Insufficient documentation

## 2019-09-01 LAB — SARS CORONAVIRUS 2 (TAT 6-24 HRS): SARS Coronavirus 2: NEGATIVE

## 2019-09-01 NOTE — Discharge Instructions (Signed)

## 2019-09-05 ENCOUNTER — Encounter: Payer: Self-pay | Admitting: Ophthalmology

## 2019-09-05 ENCOUNTER — Ambulatory Visit: Payer: Medicare Other | Admitting: Anesthesiology

## 2019-09-05 ENCOUNTER — Ambulatory Visit
Admission: RE | Admit: 2019-09-05 | Discharge: 2019-09-05 | Disposition: A | Discharge status: Home / Self Care | Payer: Medicare Other | Attending: Ophthalmology | Admitting: Ophthalmology

## 2019-09-05 ENCOUNTER — Other Ambulatory Visit: Payer: Self-pay

## 2019-09-05 ENCOUNTER — Encounter
Admission: RE | Disposition: A | Discharge status: Home / Self Care | Payer: Self-pay | Source: Home / Self Care | Attending: Ophthalmology

## 2019-09-05 DIAGNOSIS — I10 Essential (primary) hypertension: Secondary | ICD-10-CM | POA: Insufficient documentation

## 2019-09-05 DIAGNOSIS — Z79899 Other long term (current) drug therapy: Secondary | ICD-10-CM | POA: Diagnosis not present

## 2019-09-05 DIAGNOSIS — M1990 Unspecified osteoarthritis, unspecified site: Secondary | ICD-10-CM | POA: Insufficient documentation

## 2019-09-05 DIAGNOSIS — Z7982 Long term (current) use of aspirin: Secondary | ICD-10-CM | POA: Diagnosis not present

## 2019-09-05 DIAGNOSIS — Z7984 Long term (current) use of oral hypoglycemic drugs: Secondary | ICD-10-CM | POA: Diagnosis not present

## 2019-09-05 DIAGNOSIS — E1136 Type 2 diabetes mellitus with diabetic cataract: Secondary | ICD-10-CM | POA: Insufficient documentation

## 2019-09-05 DIAGNOSIS — H2511 Age-related nuclear cataract, right eye: Secondary | ICD-10-CM | POA: Insufficient documentation

## 2019-09-05 DIAGNOSIS — Z885 Allergy status to narcotic agent status: Secondary | ICD-10-CM | POA: Diagnosis not present

## 2019-09-05 DIAGNOSIS — H25811 Combined forms of age-related cataract, right eye: Secondary | ICD-10-CM | POA: Diagnosis not present

## 2019-09-05 HISTORY — PX: CATARACT EXTRACTION W/PHACO: SHX586

## 2019-09-05 LAB — GLUCOSE, CAPILLARY
Glucose-Capillary: 130 mg/dL — ABNORMAL HIGH (ref 70–99)
Glucose-Capillary: 142 mg/dL — ABNORMAL HIGH (ref 70–99)

## 2019-09-05 SURGERY — PHACOEMULSIFICATION, CATARACT, WITH IOL INSERTION
Anesthesia: Monitor Anesthesia Care | Site: Eye | Laterality: Right

## 2019-09-05 MED ORDER — SODIUM HYALURONATE 23 MG/ML IO SOLN
INTRAOCULAR | Status: DC | PRN
  Administered 2019-09-05: 0.6 mL via INTRAOCULAR

## 2019-09-05 MED ORDER — ARMC OPHTHALMIC DILATING DROPS
1.0000 "application " | OPHTHALMIC | Status: DC | PRN
  Administered 2019-09-05 (×3): 1 via OPHTHALMIC

## 2019-09-05 MED ORDER — ONDANSETRON HCL 4 MG/2ML IJ SOLN
INTRAMUSCULAR | Status: DC | PRN
  Administered 2019-09-05: 4 mg via INTRAVENOUS

## 2019-09-05 MED ORDER — LIDOCAINE HCL (PF) 2 % IJ SOLN
INTRAOCULAR | Status: DC | PRN
  Administered 2019-09-05: 1 mL via INTRAOCULAR

## 2019-09-05 MED ORDER — SODIUM HYALURONATE 10 MG/ML IO SOLN
INTRAOCULAR | Status: DC | PRN
  Administered 2019-09-05: 0.55 mL via INTRAOCULAR

## 2019-09-05 MED ORDER — MOXIFLOXACIN HCL 0.5 % OP SOLN
OPHTHALMIC | Status: DC | PRN
  Administered 2019-09-05: 0.2 mL via OPHTHALMIC

## 2019-09-05 MED ORDER — MIDAZOLAM HCL 2 MG/2ML IJ SOLN
INTRAMUSCULAR | Status: DC | PRN
  Administered 2019-09-05 (×2): 1 mg via INTRAVENOUS

## 2019-09-05 MED ORDER — FENTANYL CITRATE (PF) 100 MCG/2ML IJ SOLN
INTRAMUSCULAR | Status: DC | PRN
  Administered 2019-09-05 (×2): 50 ug via INTRAVENOUS

## 2019-09-05 MED ORDER — TETRACAINE HCL 0.5 % OP SOLN
1.0000 [drp] | OPHTHALMIC | Status: DC | PRN
  Administered 2019-09-05 (×3): 1 [drp] via OPHTHALMIC

## 2019-09-05 MED ORDER — EPINEPHRINE PF 1 MG/ML IJ SOLN
INTRAOCULAR | Status: DC | PRN
  Administered 2019-09-05: 82 mL via OPHTHALMIC

## 2019-09-05 MED ORDER — LACTATED RINGERS IV SOLN: 10.0000 mL/h | INTRAVENOUS | Status: DC

## 2019-09-05 SURGICAL SUPPLY — 17 items
CANNULA ANT/CHMB 27GA (MISCELLANEOUS) ×4 IMPLANT
DISSECTOR HYDRO NUCLEUS 50X22 (MISCELLANEOUS) ×2 IMPLANT
GLOVE SURG LX 7.5 STRW (GLOVE) ×1
GLOVE SURG LX STRL 7.5 STRW (GLOVE) ×1 IMPLANT
GLOVE SURG SYN 8.5  E (GLOVE) ×1
GLOVE SURG SYN 8.5 E (GLOVE) ×1 IMPLANT
GOWN STRL REUS W/ TWL LRG LVL3 (GOWN DISPOSABLE) ×2 IMPLANT
GOWN STRL REUS W/TWL LRG LVL3 (GOWN DISPOSABLE) ×2
LENS IOL TECNIS ITEC 17.5 (Intraocular Lens) ×2 IMPLANT
MARKER SKIN DUAL TIP RULER LAB (MISCELLANEOUS) ×2 IMPLANT
PACK DR. KING ARMS (PACKS) ×2 IMPLANT
PACK EYE AFTER SURG (MISCELLANEOUS) ×2 IMPLANT
PACK OPTHALMIC (MISCELLANEOUS) ×2 IMPLANT
SYR 3ML LL SCALE MARK (SYRINGE) ×2 IMPLANT
SYR TB 1ML LUER SLIP (SYRINGE) ×2 IMPLANT
WATER STERILE IRR 250ML POUR (IV SOLUTION) ×2 IMPLANT
WIPE NON LINTING 3.25X3.25 (MISCELLANEOUS) ×2 IMPLANT

## 2019-09-05 NOTE — Transfer of Care (Signed)
Immediate Anesthesia Transfer of Care Note  Patient: Andrea Dodson  Procedure(s) Performed: CATARACT EXTRACTION PHACO AND INTRAOCULAR LENS PLACEMENT (IOC) RIGHT DIABETIC 4.65  00:34.2 (Right Eye)  Patient Location: PACU  Anesthesia Type: MAC  Level of Consciousness: awake, alert  and patient cooperative  Airway and Oxygen Therapy: Patient Spontanous Breathing   Post-op Assessment: Post-op Vital signs reviewed, Patient's Cardiovascular Status Stable, Respiratory Function Stable, Patent Airway and No signs of Nausea or vomiting  Post-op Vital Signs: Reviewed and stable  Complications: No apparent anesthesia complications

## 2019-09-05 NOTE — Op Note (Signed)
OPERATIVE NOTE  Jeziah Blackbear [id number] 09/05/2019   PREOPERATIVE DIAGNOSIS:  Nuclear sclerotic cataract right eye.  H25.11   POSTOPERATIVE DIAGNOSIS:    Nuclear sclerotic cataract right eye.     PROCEDURE:  Phacoemusification with posterior chamber intraocular lens placement of the right eye   LENS:   Implant Name Type Inv. Item Serial No. Manufacturer Lot No. LRB No. Used Action  LENS IOL DIOP 17.5 [id number] Intraocular Lens LENS IOL DIOP 17.5 [id number]  Right 1 Implanted       Procedure(s) with comments: CATARACT EXTRACTION PHACO AND INTRAOCULAR LENS PLACEMENT (IOC) RIGHT DIABETIC 4.65  00:34.2 (Right) - Diabetic - oral meds  PCB00 +17.5   ULTRASOUND TIME: 0 minutes 34 seconds.  CDE 4.65   SURGEON:  Benay Pillow, MD, MPH  ANESTHESIOLOGIST: Anesthesiologist: Ardeth Sportsman, MD CRNA: Vanetta Shawl, CRNA   ANESTHESIA:  Topical with tetracaine drops augmented with 1% preservative-free intracameral lidocaine.  ESTIMATED BLOOD LOSS: less than 1 mL.   COMPLICATIONS:  None.   DESCRIPTION OF PROCEDURE:  The patient was identified in the holding room and transported to the operating room and placed in the supine position under the operating microscope.  The right eye was identified as the operative eye and it was prepped and draped in the usual sterile ophthalmic fashion.   A 1.0 millimeter clear-corneal paracentesis was made at the 10:30 position. 0.5 ml of preservative-free 1% lidocaine with epinephrine was injected into the anterior chamber.  The anterior chamber was filled with Healon 5 viscoelastic.  A 2.4 millimeter keratome was used to make a near-clear corneal incision at the 8:00 position.  A curvilinear capsulorrhexis was made with a cystotome and capsulorrhexis forceps.  Balanced salt solution was used to hydrodissect and hydrodelineate the nucleus.   Phacoemulsification was then used in stop and chop fashion to remove the lens nucleus and epinucleus.  The  remaining cortex was then removed using the irrigation and aspiration handpiece. Healon was then placed into the capsular bag to distend it for lens placement.  A lens was then injected into the capsular bag.  The remaining viscoelastic was aspirated.   Wounds were hydrated with balanced salt solution.  The anterior chamber was inflated to a physiologic pressure with balanced salt solution.   Intracameral vigamox 0.1 mL undiluted was injected into the eye and a drop placed onto the ocular surface.  No wound leaks were noted.  The patient was taken to the recovery room in stable condition without complications of anesthesia or surgery  Benay Pillow 09/05/2019, 8:06 AM

## 2019-09-05 NOTE — Anesthesia Procedure Notes (Signed)
Procedure Name: MAC Date/Time: 09/05/2019 7:45 AM Performed by: Vanetta Shawl, CRNA Pre-anesthesia Checklist: Patient identified, Emergency Drugs available, Suction available, Timeout performed and Patient being monitored Patient Re-evaluated:Patient Re-evaluated prior to induction Oxygen Delivery Method: Nasal cannula Placement Confirmation: positive ETCO2

## 2019-09-05 NOTE — H&P (Signed)

## 2019-09-05 NOTE — Anesthesia Preprocedure Evaluation (Signed)
Anesthesia Evaluation  Patient identified by MRN, date of birth, ID band Patient awake    Reviewed: Allergy & Precautions, NPO status , Patient's Chart, lab work & pertinent test results  History of Anesthesia Complications Negative for: history of anesthetic complications  Airway Mallampati: II  TM Distance: >3 FB Neck ROM: Full    Dental  (+) Lower Dentures, Upper Dentures   Pulmonary neg pulmonary ROS,    Pulmonary exam normal        Cardiovascular Exercise Tolerance: Good hypertension, Pt. on medications Normal cardiovascular exam     Neuro/Psych negative neurological ROS  negative psych ROS   GI/Hepatic negative GI ROS, Neg liver ROS,   Endo/Other  diabetes, Well Controlled, Type 2  Renal/GU negative Renal ROS  negative genitourinary   Musculoskeletal  (+) Arthritis , Osteoarthritis,    Abdominal Normal abdominal exam  (+)   Peds negative pediatric ROS (+)  Hematology negative hematology ROS (+)   Anesthesia Other Findings   Reproductive/Obstetrics                             Anesthesia Physical  Anesthesia Plan  ASA: II  Anesthesia Plan: MAC   Post-op Pain Management:    Induction: Intravenous  PONV Risk Score and Plan: 2 and Treatment may vary due to age or medical condition, Midazolam and TIVA  Airway Management Planned: Nasal Cannula and Natural Airway  Additional Equipment: None  Intra-op Plan:   Post-operative Plan:   Informed Consent: I have reviewed the patients History and Physical, chart, labs and discussed the procedure including the risks, benefits and alternatives for the proposed anesthesia with the patient or authorized representative who has indicated his/her understanding and acceptance.       Plan Discussed with: CRNA  Anesthesia Plan Comments:         Anesthesia Quick Evaluation

## 2019-09-05 NOTE — Anesthesia Postprocedure Evaluation (Signed)
Anesthesia Post Note  Patient: Andrea Dodson  Procedure(s) Performed: CATARACT EXTRACTION PHACO AND INTRAOCULAR LENS PLACEMENT (IOC) RIGHT DIABETIC 4.65  00:34.2 (Right Eye)     Anesthesia Post Evaluation  Ryan Bialas

## 2019-09-06 ENCOUNTER — Encounter: Payer: Self-pay | Admitting: *Deleted

## 2019-09-12 ENCOUNTER — Ambulatory Visit: Payer: Self-pay | Admitting: General Practice

## 2019-09-12 ENCOUNTER — Telehealth: Payer: Self-pay

## 2019-09-12 NOTE — Chronic Care Management (AMB) (Signed)
°  Chronic Care Management   Outreach Note  09/12/2019 Name: Andrea Dodson MRN: [id number] DOB: 08/15/1950  Referred by: Olin Hauser, DO Reason for referral : Chronic Care Management (Follow up: DM2/HTN/HLD and new concerns)   An unsuccessful telephone outreach was attempted today. The patient was referred to the case management team for assistance with care management and care coordination.   Follow Up Plan: A HIPPA compliant phone message was left for the patient providing contact information and requesting a return call.   Noreene Larsson RN, MSN, [place] Mobile: [phone]

## 2019-09-26 ENCOUNTER — Other Ambulatory Visit: Payer: Self-pay | Admitting: Family Medicine

## 2019-09-26 DIAGNOSIS — Z1231 Encounter for screening mammogram for malignant neoplasm of breast: Secondary | ICD-10-CM

## 2019-10-06 ENCOUNTER — Ambulatory Visit
Admission: RE | Admit: 2019-10-06 | Discharge: 2019-10-06 | Disposition: A | Discharge status: Home / Self Care | Payer: Medicare Other | Source: Ambulatory Visit | Attending: Family Medicine | Admitting: Family Medicine

## 2019-10-06 DIAGNOSIS — Z1231 Encounter for screening mammogram for malignant neoplasm of breast: Secondary | ICD-10-CM | POA: Diagnosis not present

## 2019-10-25 LAB — HM DIABETES EYE EXAM

## 2019-11-03 ENCOUNTER — Telehealth: Payer: Self-pay

## 2019-11-03 ENCOUNTER — Ambulatory Visit: Payer: Self-pay | Admitting: General Practice

## 2019-11-03 NOTE — Chronic Care Management (AMB) (Signed)
  Chronic Care Management   Outreach Note  11/03/2019 Name: Andrea Dodson MRN: [id number] DOB: 18-Oct-1950  Referred by: Olin Hauser, DO Reason for referral : Chronic Care Management (RNCM Follow up call/2nd attempt: Chronic Disease Management and Care Coordination Needs)   A second unsuccessful telephone outreach was attempted today. The patient was referred to the case management team for assistance with care management and care coordination.   Follow Up Plan: A HIPPA compliant phone message was left for the patient providing contact information and requesting a return call.   Noreene Larsson RN, MSN, [place] Mobile: [phone]

## 2019-11-28 ENCOUNTER — Telehealth: Payer: Self-pay | Admitting: General Practice

## 2019-11-28 ENCOUNTER — Ambulatory Visit (INDEPENDENT_AMBULATORY_CARE_PROVIDER_SITE_OTHER): Payer: Medicare Other | Admitting: General Practice

## 2019-11-28 DIAGNOSIS — E1169 Type 2 diabetes mellitus with other specified complication: Secondary | ICD-10-CM

## 2019-11-28 DIAGNOSIS — E785 Hyperlipidemia, unspecified: Secondary | ICD-10-CM | POA: Diagnosis not present

## 2019-11-28 DIAGNOSIS — I10 Essential (primary) hypertension: Secondary | ICD-10-CM

## 2019-11-28 NOTE — Patient Instructions (Signed)
Visit Information  Goals Addressed              This Visit's Progress   .  RNCM: Pt: "I haven't checked my blood pressure lately" (pt-stated)        CARE PLAN ENTRY (see longtitudinal plan of care for additional care plan information)  Current Barriers:  . Chronic Disease Management support, education, and care coordination needs related to HTN, HLD, and DMII  Clinical Goal(s) related to HTN, HLD, and DMII:  Over the next 120 days, patient will:  . Work with the care management team to address educational, disease management, and care coordination needs  . Begin or continue self health monitoring activities as directed today Measure and record cbg (blood glucose) 1 times daily, Measure and record blood pressure 2 times per week, and adhere to a heart healthy/ADA diet . Call provider office for new or worsened signs and symptoms Blood glucose findings outside established parameters, Blood pressure findings outside established parameters, and New or worsened symptom related to HLD and other chronic conditions . Call care management team with questions or concerns . Verbalize basic understanding of patient centered plan of care established today  Interventions related to HTN, HLD, and DMII:  . Evaluation of current treatment plans and patient's adherence to plan as established by provider.  The patient is doing well. Denies any new concerns. Feels like she is managing her health well.  . Assessed patient understanding of disease states.  Has a good understanding of her chronic conditions.  . Assessed patient's education and care coordination needs.  The patient denies any care coordination needs. Review of heart healthy/ADA Diet . Review of upcoming appointments: appointment with pcp on 01-27-2020.  Marland Kitchen Evaluation of cataract surgery. The patient states she is doing very well and can actually see now. Is planning on getting new lens for her glasses for reading. Does not need glasses when she  is not reading.  . Provided disease specific education to patient: review of heart healthy/ADA diet.  The patient admits she does not always adhere to a heart healthy/ADA diet. She does try to eat a balanced diet. She lives with her son and her sister and they take turns cooking. She admits she doesn't always "eat right" but she is trying to be more mindful of what she is eating.  Will send information by my chart and EMMI for review on heart healthy/ADA diet. . Review of blood sugar readings. The patient states fasting her blood sugars are around 130 and consistently stay that range. She does not usually check during the day or at night. Denies any episodes of hyperglycemia or hypoglycemia.  . Review of blood pressure readings. The patient takes her blood pressure regularly. States that last reading was 128/68.  She states she has good blood pressure control.  . Evaluation of activity level. The patient mows her yard weekly. She has a riding mower. It usually takes her about 3 hours to mow. Sometimes she splits it up into 2 days. The patient states that she doesn't mow when it is hot outside and denies any concerns with safety.  Nash Dimmer with appropriate clinical care team members regarding patient needs- has no needs at this time but knows pharmacist and LCSW are available to help if needed.   Patient Self Care Activities related to HTN, HLD, and DMII:  . Patient is unable to independently self-manage chronic health conditions  Please see past updates related to this goal by clicking on  the "Past Updates" button in the selected goal         Patient verbalizes understanding of instructions provided today.   Telephone follow up appointment with care management team member scheduled for: 01-30-2020 at [place] am  Noreene Larsson RN, MSN, [facility] Mobile: [phone]   Carbohydrate Counting for Diabetes  [place], Adult  Carbohydrate counting is a method of keeping track of how many carbohydrates you eat. Eating carbohydrates naturally increases the amount of sugar (glucose) in the blood. Counting how many carbohydrates you eat helps keep your blood glucose within normal limits, which helps you manage your diabetes (diabetes mellitus). It is important to know how many carbohydrates you can safely have in each meal. This is different for every person. A diet and nutrition specialist (registered dietitian) can help you make a meal plan and calculate how many carbohydrates you should have at each meal and snack. Carbohydrates are found in the following foods:  Grains, such as breads and cereals.  Dried beans and soy products.  Starchy vegetables, such as potatoes, peas, and corn.  Fruit and fruit juices.  Milk and yogurt.  Sweets and snack foods, such as cake, cookies, candy, chips, and soft drinks. How do I count carbohydrates? There are two ways to count carbohydrates in food. You can use either of the methods or a combination of both. Reading "Nutrition Facts" on packaged food The "Nutrition Facts" list is included on the labels of almost all packaged foods and beverages in the [place] It includes:  The serving size.  Information about nutrients in each serving, including the grams (g) of carbohydrate per serving. To use the "Nutrition Facts":  Decide how many servings you will have.  Multiply the number of servings by the number of carbohydrates per serving.  The resulting number is the total amount of carbohydrates that you will be having. Learning standard serving sizes of other foods When you eat carbohydrate foods that are not packaged or do not include "Nutrition Facts" on the label, you need to measure the servings in order to count the amount of carbohydrates:  Measure the foods that you will eat with a food scale or measuring cup, if needed.  Decide how many standard-size  servings you will eat.  Multiply the number of servings by 15. Most carbohydrate-rich foods have about 15 g of carbohydrates per serving. ? For example, if you eat 8 oz (170 g) of strawberries, you will have eaten 2 servings and 30 g of carbohydrates (2 servings x 15 g = 30 g).  For foods that have more than one food mixed, such as soups and casseroles, you must count the carbohydrates in each food that is included. The following list contains standard serving sizes of common carbohydrate-rich foods. Each of these servings has about 15 g of carbohydrates:   hamburger bun or  English muffin.   oz (15 mL) syrup.   oz (14 g) jelly.  1 slice of bread.  1 six-inch tortilla.  3 oz (85 g) cooked rice or pasta.  4 oz (113 g) cooked dried beans.  4 oz (113 g) starchy vegetable, such as peas, corn, or potatoes.  4 oz (113 g) hot cereal.  4 oz (113 g) mashed potatoes or  of a large baked potato.  4 oz (113 g) canned or frozen fruit.  4 oz (120 mL) fruit juice.  4-6 crackers.  6 chicken nuggets.  6  oz (170 g) unsweetened dry cereal.  6 oz (170 g) plain fat-free yogurt or yogurt sweetened with artificial sweeteners.  8 oz (240 mL) milk.  8 oz (170 g) fresh fruit or one small piece of fruit.  24 oz (680 g) popped popcorn. Example of carbohydrate counting Sample meal  3 oz (85 g) chicken breast.  6 oz (170 g) brown rice.  4 oz (113 g) corn.  8 oz (240 mL) milk.  8 oz (170 g) strawberries with sugar-free whipped topping. Carbohydrate calculation 1. Identify the foods that contain carbohydrates: ? Rice. ? Corn. ? Milk. ? Strawberries. 2. Calculate how many servings you have of each food: ? 2 servings rice. ? 1 serving corn. ? 1 serving milk. ? 1 serving strawberries. 3. Multiply each number of servings by 15 g: ? 2 servings rice x 15 g = 30 g. ? 1 serving corn x 15 g = 15 g. ? 1 serving milk x 15 g = 15 g. ? 1 serving strawberries x 15 g = 15 g. 4. Add  together all of the amounts to find the total grams of carbohydrates eaten: ? 30 g + 15 g + 15 g + 15 g = 75 g of carbohydrates total. Summary  Carbohydrate counting is a method of keeping track of how many carbohydrates you eat.  Eating carbohydrates naturally increases the amount of sugar (glucose) in the blood.  Counting how many carbohydrates you eat helps keep your blood glucose within normal limits, which helps you manage your diabetes.  A diet and nutrition specialist (registered dietitian) can help you make a meal plan and calculate how many carbohydrates you should have at each meal and snack. This information is not intended to replace advice given to you by your health care provider. Make sure you discuss any questions you have with your health care provider. Document Revised: 10/30/2016 Document Reviewed: 09/19/2015 Elsevier Patient Education  [place] DASH stands for "Dietary Approaches to Stop Hypertension." The DASH eating plan is a healthy eating plan that has been shown to reduce high blood pressure (hypertension). It may also reduce your risk for type 2 diabetes, heart disease, and stroke. The DASH eating plan may also help with weight loss. What are tips for following this plan?  General guidelines Avoid eating more than 2,300 mg (milligrams) of salt (sodium) a day. If you have hypertension, you may need to reduce your sodium intake to 1,500 mg a day. Limit alcohol intake to no more than 1 drink a day for nonpregnant women and 2 drinks a day for men. One drink equals 12 oz of beer, 5 oz of wine, or 1 oz of hard liquor. Work with your health care provider to maintain a healthy body weight or to lose weight. Ask what an ideal weight is for you. Get at least 30 minutes of exercise that causes your heart to beat faster (aerobic exercise) most days of the week. Activities may include walking, swimming, or biking. Work with your health care provider  or diet and nutrition specialist (dietitian) to adjust your eating plan to your individual calorie needs. Reading food labels  Check food labels for the amount of sodium per serving. Choose foods with less than 5 percent of the Daily Value of sodium. Generally, foods with less than 300 mg of sodium per serving fit into this eating plan. To find whole grains, look for the word "whole" as the first word in the ingredient  list. Shopping Buy products labeled as "low-sodium" or "no salt added." Buy fresh foods. Avoid canned foods and premade or frozen meals. Cooking Avoid adding salt when cooking. Use salt-free seasonings or herbs instead of table salt or sea salt. Check with your health care provider or pharmacist before using salt substitutes. Do not fry foods. Cook foods using healthy methods such as baking, boiling, grilling, and broiling instead. Cook with heart-healthy oils, such as olive, canola, soybean, or sunflower oil. Meal planning Eat a balanced diet that includes: 5 or more servings of fruits and vegetables each day. At each meal, try to fill half of your plate with fruits and vegetables. Up to 6-8 servings of whole grains each day. Less than 6 oz of lean meat, poultry, or fish each day. A 3-oz serving of meat is about the same size as a deck of cards. One egg equals 1 oz. 2 servings of low-fat dairy each day. A serving of nuts, seeds, or beans 5 times each week. Heart-healthy fats. Healthy fats called Omega-3 fatty acids are found in foods such as flaxseeds and coldwater fish, like sardines, salmon, and mackerel. Limit how much you eat of the following: Canned or prepackaged foods. Food that is high in trans fat, such as fried foods. Food that is high in saturated fat, such as fatty meat. Sweets, desserts, sugary drinks, and other foods with added sugar. Full-fat dairy products. Do not salt foods before eating. Try to eat at least 2 vegetarian meals each week. Eat more  home-cooked food and less restaurant, buffet, and fast food. When eating at a restaurant, ask that your food be prepared with less salt or no salt, if possible. What foods are recommended? The items listed may not be a complete list. Talk with your dietitian about what dietary choices are best for you. Grains Whole-grain or whole-wheat bread. Whole-grain or whole-wheat pasta. Brown rice. [place]. Bulgur. Whole-grain and low-sodium cereals. Pita bread. Low-fat, low-sodium crackers. Whole-wheat flour tortillas. Vegetables Fresh or frozen vegetables (raw, steamed, roasted, or grilled). Low-sodium or reduced-sodium tomato and vegetable juice. Low-sodium or reduced-sodium tomato sauce and tomato paste. Low-sodium or reduced-sodium canned vegetables. Fruits All fresh, dried, or frozen fruit. Canned fruit in natural juice (without added sugar). Meat and other protein foods Skinless chicken or [place]. Ground chicken or [place]. Pork with fat trimmed off. Fish and seafood. Egg whites. Dried beans, peas, or lentils. Unsalted nuts, nut butters, and seeds. Unsalted canned beans. Lean cuts of beef with fat trimmed off. Low-sodium, lean deli meat. Dairy Low-fat (1%) or fat-free (skim) milk. Fat-free, low-fat, or reduced-fat cheeses. Nonfat, low-sodium ricotta or cottage cheese. Low-fat or nonfat yogurt. Low-fat, low-sodium cheese. Fats and oils Soft margarine without trans fats. Vegetable oil. Low-fat, reduced-fat, or light mayonnaise and salad dressings (reduced-sodium). Canola, safflower, olive, soybean, and sunflower oils. Avocado. Seasoning and other foods Herbs. Spices. Seasoning mixes without salt. Unsalted popcorn and pretzels. Fat-free sweets. What foods are not recommended? The items listed may not be a complete list. Talk with your dietitian about what dietary choices are best for you. Grains Baked goods made with fat, such as croissants, muffins, or some breads. Dry pasta or rice meal  packs. Vegetables Creamed or fried vegetables. Vegetables in a cheese sauce. Regular canned vegetables (not low-sodium or reduced-sodium). Regular canned tomato sauce and paste (not low-sodium or reduced-sodium). Regular tomato and vegetable juice (not low-sodium or reduced-sodium). Angie Fava. Olives. Fruits Canned fruit in a light or heavy syrup. Fried fruit. Fruit in cream  or butter sauce. Meat and other protein foods Fatty cuts of meat. Ribs. Fried meat. Berniece Salines. Sausage. Bologna and other processed lunch meats. Salami. Fatback. Hotdogs. Bratwurst. Salted nuts and seeds. Canned beans with added salt. Canned or smoked fish. Whole eggs or egg yolks. Chicken or [place] with skin. Dairy Whole or 2% milk, cream, and half-and-half. Whole or full-fat cream cheese. Whole-fat or sweetened yogurt. Full-fat cheese. Nondairy creamers. Whipped toppings. Processed cheese and cheese spreads. Fats and oils Butter. Stick margarine. Lard. Shortening. Ghee. Bacon fat. Tropical oils, such as coconut, palm kernel, or palm oil. Seasoning and other foods Salted popcorn and pretzels. Onion salt, garlic salt, seasoned salt, table salt, and sea salt. Worcestershire sauce. Tartar sauce. Barbecue sauce. Teriyaki sauce. Soy sauce, including reduced-sodium. Steak sauce. Canned and packaged gravies. Fish sauce. Oyster sauce. Cocktail sauce. Horseradish that you find on the shelf. Ketchup. Mustard. Meat flavorings and tenderizers. Bouillon cubes. Hot sauce and Tabasco sauce. Premade or packaged marinades. Premade or packaged taco seasonings. Relishes. Regular salad dressings. Where to find more information: National Heart, Lung, and [place]: https://wilson-eaton.com/ American Heart Association: www.heart.org Summary The DASH eating plan is a healthy eating plan that has been shown to reduce high blood pressure (hypertension). It may also reduce your risk for type 2 diabetes, heart disease, and stroke. With the DASH eating plan, you  should limit salt (sodium) intake to 2,300 mg a day. If you have hypertension, you may need to reduce your sodium intake to 1,500 mg a day. When on the DASH eating plan, aim to eat more fresh fruits and vegetables, whole grains, lean proteins, low-fat dairy, and heart-healthy fats. Work with your health care provider or diet and nutrition specialist (dietitian) to adjust your eating plan to your individual calorie needs. This information is not intended to replace advice given to you by your health care provider. Make sure you discuss any questions you have with your health care provider. Document Revised: 03/20/2017 Document Reviewed: 03/31/2016 Elsevier Patient Education  2020 [employer].

## 2019-11-28 NOTE — Chronic Care Management (AMB) (Signed)
Chronic Care Management   Follow Up Note   11/28/2019 Name: Andrea Dodson MRN: [id number] DOB: [birth year]  Referred by: Olin Hauser, DO Reason for referral : Chronic Care Management (RNCM Follow up on Chronic Disease Management and Care Coordination Needs)   Andrea Dodson is a 69 y.o. year old female who is a primary care patient of Olin Hauser, DO. The CCM team was consulted for assistance with chronic disease management and care coordination needs.    Review of patient status, including review of consultants reports, relevant laboratory and other test results, and collaboration with appropriate care team members and the patient's provider was performed as part of comprehensive patient evaluation and provision of chronic care management services.    SDOH (Social Determinants of Health) assessments performed: Yes See Care Plan activities for detailed interventions related to [facility])     Outpatient Encounter Medications as of 11/28/2019  Medication Sig  . aspirin EC 81 MG tablet Take 1 tablet (81 mg total) by mouth daily.  . Blood Glucose Monitoring Suppl (ONE TOUCH ULTRA 2) w/Device KIT 1 device for checking blood sugar once daily  . fluticasone (FLONASE) 50 MCG/ACT nasal spray Place 2 sprays into both nostrils daily.  Marland Kitchen glucose blood (ONE TOUCH ULTRA TEST) test strip CHECK BLOOD SUGAR ONCE DAILY  . Krill Oil 350 MG CAPS Take 350 mg by mouth daily.  . Lancets (ONETOUCH ULTRASOFT) lancets Use as instructed  . loratadine (CLARITIN) 10 MG tablet Take 1 tablet (10 mg total) by mouth daily. (Patient not taking: Reported on 07/28/2019)  . losartan (COZAAR) 50 MG tablet TAKE 1 TABLET BY MOUTH  DAILY  . metFORMIN (GLUCOPHAGE) 500 MG tablet TAKE 1 TABLET BY MOUTH TWO  TIMES DAILY WITH A MEAL  . rosuvastatin (CRESTOR) 10 MG tablet TAKE 1 TABLET BY MOUTH DAILY   No facility-administered encounter medications on file as of 11/28/2019.     Objective:   Goals Addressed                This Visit's Progress   .  RNCM: Pt: "I haven't checked my blood pressure lately" (pt-stated)        CARE PLAN ENTRY (see longtitudinal plan of care for additional care plan information)  Current Barriers:  . Chronic Disease Management support, education, and care coordination needs related to HTN, HLD, and DMII  Clinical Goal(s) related to HTN, HLD, and DMII:  Over the next 120 days, patient will:  . Work with the care management team to address educational, disease management, and care coordination needs  . Begin or continue self health monitoring activities as directed today Measure and record cbg (blood glucose) 1 times daily, Measure and record blood pressure 2 times per week, and adhere to a heart healthy/ADA diet . Call provider office for new or worsened signs and symptoms Blood glucose findings outside established parameters, Blood pressure findings outside established parameters, and New or worsened symptom related to HLD and other chronic conditions . Call care management team with questions or concerns . Verbalize basic understanding of patient centered plan of care established today  Interventions related to HTN, HLD, and DMII:  . Evaluation of current treatment plans and patient's adherence to plan as established by provider.  The patient is doing well. Denies any new concerns. Feels like she is managing her health well.  . Assessed patient understanding of disease states.  Has a good understanding of her chronic conditions.  . Assessed patient's education and care coordination  needs.  The patient denies any care coordination needs. Review of heart healthy/ADA Diet . Review of upcoming appointments: appointment with pcp on 01-27-2020.  Marland Kitchen Evaluation of cataract surgery. The patient states she is doing very well and can actually see now. Is planning on getting new lens for her glasses for reading. Does not need glasses when she is not reading.  . Provided disease  specific education to patient: review of heart healthy/ADA diet.  The patient admits she does not always adhere to a heart healthy/ADA diet. She does try to eat a balanced diet. She lives with her son and her sister and they take turns cooking. She admits she doesn't always "eat right" but she is trying to be more mindful of what she is eating.  Will send information by my chart and EMMI for review on heart healthy/ADA diet. . Review of blood sugar readings. The patient states fasting her blood sugars are around 130 and consistently stay that range. She does not usually check during the day or at night. Denies any episodes of hyperglycemia or hypoglycemia.  . Review of blood pressure readings. The patient takes her blood pressure regularly. States that last reading was 128/68.  She states she has good blood pressure control.  . Evaluation of activity level. The patient mows her yard weekly. She has a riding mower. It usually takes her about 3 hours to mow. Sometimes she splits it up into 2 days. The patient states that she doesn't mow when it is hot outside and denies any concerns with safety.  Nash Dimmer with appropriate clinical care team members regarding patient needs- has no needs at this time but knows pharmacist and LCSW are available to help if needed.   Patient Self Care Activities related to HTN, HLD, and DMII:  . Patient is unable to independently self-manage chronic health conditions  Please see past updates related to this goal by clicking on the "Past Updates" button in the selected goal          Plan:   Telephone follow up appointment with care management team member scheduled for: 01-30-2020 at 0945 am.   Noreene Larsson RN, MSN, [facility] Mobile: [phone]

## 2019-12-28 ENCOUNTER — Other Ambulatory Visit: Payer: Self-pay | Admitting: Family Medicine

## 2020-01-23 ENCOUNTER — Other Ambulatory Visit: Payer: Self-pay | Admitting: *Deleted

## 2020-01-23 DIAGNOSIS — I10 Essential (primary) hypertension: Secondary | ICD-10-CM

## 2020-01-23 DIAGNOSIS — E785 Hyperlipidemia, unspecified: Secondary | ICD-10-CM

## 2020-01-23 DIAGNOSIS — Z0000 Encounter for general adult medical examination without abnormal findings: Secondary | ICD-10-CM

## 2020-01-23 DIAGNOSIS — E1169 Type 2 diabetes mellitus with other specified complication: Secondary | ICD-10-CM

## 2020-01-24 ENCOUNTER — Other Ambulatory Visit: Payer: Medicare Other

## 2020-01-24 ENCOUNTER — Other Ambulatory Visit: Payer: Self-pay

## 2020-01-24 DIAGNOSIS — E785 Hyperlipidemia, unspecified: Secondary | ICD-10-CM | POA: Diagnosis not present

## 2020-01-24 DIAGNOSIS — I10 Essential (primary) hypertension: Secondary | ICD-10-CM | POA: Diagnosis not present

## 2020-01-24 DIAGNOSIS — E1169 Type 2 diabetes mellitus with other specified complication: Secondary | ICD-10-CM | POA: Diagnosis not present

## 2020-01-24 DIAGNOSIS — Z0000 Encounter for general adult medical examination without abnormal findings: Secondary | ICD-10-CM | POA: Diagnosis not present

## 2020-01-25 LAB — CBC WITH DIFFERENTIAL/PLATELET
Absolute Monocytes: 531 cells/uL (ref 200–950)
Basophils Absolute: 48 cells/uL (ref 0–200)
Basophils Relative: 0.7 %
Eosinophils Absolute: 283 cells/uL (ref 15–500)
Eosinophils Relative: 4.1 %
HCT: 37.5 % (ref 35.0–45.0)
Hemoglobin: 12.7 g/dL (ref 11.7–15.5)
Lymphs Abs: 1718 cells/uL (ref 850–3900)
MCH: 30.4 pg (ref 27.0–33.0)
MCHC: 33.9 g/dL (ref 32.0–36.0)
MCV: 89.7 fL (ref 80.0–100.0)
MPV: 8.9 fL (ref 7.5–12.5)
Monocytes Relative: 7.7 %
Neutro Abs: 4319 cells/uL (ref 1500–7800)
Neutrophils Relative %: 62.6 %
Platelets: 281 10*3/uL (ref 140–400)
RBC: 4.18 10*6/uL (ref 3.80–5.10)
RDW: 13 % (ref 11.0–15.0)
Total Lymphocyte: 24.9 %
WBC: 6.9 10*3/uL (ref 3.8–10.8)

## 2020-01-25 LAB — LIPID PANEL
Cholesterol: 123 mg/dL (ref <200)
HDL: 51 mg/dL (ref >=50)
LDL Cholesterol (Calc): 48 mg/dL (calc)
Non-HDL Cholesterol (Calc): 72 mg/dL (calc) (ref <130)
Total CHOL/HDL Ratio: 2.4 (calc) (ref <5.0)
Triglycerides: 165 mg/dL — ABNORMAL HIGH (ref <150)

## 2020-01-25 LAB — HEMOGLOBIN A1C
Hgb A1c MFr Bld: 7.6 % of total Hgb — ABNORMAL HIGH (ref <5.7)
Mean Plasma Glucose: 171 (calc)
eAG (mmol/L): 9.5 (calc)

## 2020-01-25 LAB — COMPLETE METABOLIC PANEL WITH GFR
AG Ratio: 1.7 (calc) (ref 1.0–2.5)
ALT: 10 U/L (ref 6–29)
AST: 24 U/L (ref 10–35)
Albumin: 4.2 g/dL (ref 3.6–5.1)
Alkaline phosphatase (APISO): 93 U/L (ref 37–153)
BUN: 14 mg/dL (ref 7–25)
CO2: 22 mmol/L (ref 20–32)
Calcium: 9.4 mg/dL (ref 8.6–10.4)
Chloride: 103 mmol/L (ref 98–110)
Creat: 0.83 mg/dL (ref 0.50–0.99)
GFR, Est African American: 84 mL/min/{1.73_m2} (ref >=60)
GFR, Est Non African American: 72 mL/min/{1.73_m2} (ref >=60)
Globulin: 2.5 g/dL (calc) (ref 1.9–3.7)
Glucose, Bld: 161 mg/dL — ABNORMAL HIGH (ref 65–99)
Potassium: 4.3 mmol/L (ref 3.5–5.3)
Sodium: 137 mmol/L (ref 135–146)
Total Bilirubin: 0.6 mg/dL (ref 0.2–1.2)
Total Protein: 6.7 g/dL (ref 6.1–8.1)

## 2020-01-25 LAB — TSH: TSH: 2.33 mIU/L (ref 0.40–4.50)

## 2020-01-27 ENCOUNTER — Encounter: Payer: Medicare Other | Admitting: Family Medicine

## 2020-01-30 ENCOUNTER — Ambulatory Visit (INDEPENDENT_AMBULATORY_CARE_PROVIDER_SITE_OTHER): Payer: Medicare Other | Admitting: General Practice

## 2020-01-30 ENCOUNTER — Telehealth: Payer: Self-pay | Admitting: General Practice

## 2020-01-30 DIAGNOSIS — E785 Hyperlipidemia, unspecified: Secondary | ICD-10-CM

## 2020-01-30 DIAGNOSIS — E1169 Type 2 diabetes mellitus with other specified complication: Secondary | ICD-10-CM | POA: Diagnosis not present

## 2020-01-30 DIAGNOSIS — I10 Essential (primary) hypertension: Secondary | ICD-10-CM | POA: Diagnosis not present

## 2020-01-30 NOTE — Patient Instructions (Signed)
Visit Information  Goals Addressed              This Visit's Progress   .  RNCM: Pt: "I haven't checked my blood pressure lately" (pt-stated)        CARE PLAN ENTRY (see longtitudinal plan of care for additional care plan information)  Current Barriers:  . Chronic Disease Management support, education, and care coordination needs related to HTN, HLD, and DMII  Clinical Goal(s) related to HTN, HLD, and DMII:  Over the next 120 days, patient will:  . Work with the care management team to address educational, disease management, and care coordination needs  . Begin or continue self health monitoring activities as directed today Measure and record cbg (blood glucose) 1 times daily, Measure and record blood pressure 2 times per week, and adhere to a heart healthy/ADA diet . Call provider office for new or worsened signs and symptoms Blood glucose findings outside established parameters, Blood pressure findings outside established parameters, and New or worsened symptom related to HLD and other chronic conditions . Call care management team with questions or concerns . Verbalize basic understanding of patient centered plan of care established today  Interventions related to HTN, HLD, and DMII:  . Evaluation of current treatment plans and patient's adherence to plan as established by provider.  The patient is doing well. Denies any new concerns. Feels like she is managing her health well. 01-30-2020: The patient states that her hemoglobin A1C is up to 7.6 and it is because she has not been good. The patient states with her recent cataract surgery she was off with her eating but wants to get back on track.   . Assessed patient understanding of disease states.  Has a good understanding of her chronic conditions.  . Assessed patient's education and care coordination needs.  The patient denies any care coordination needs. Review of heart healthy/ADA Diet.  01-30-2020: The patient states that she has  not been "good" with her eating. The patient states that she is going to do better. Her blood sugars have been 160-175 and she knows she has got to do better with diabetes control.  . Review of upcoming appointments: appointment with pcp on 02-01-2020.  Marland Kitchen Evaluation of cataract surgery. The patient states she is doing very well and can actually see now. Is planning on getting new lens for her glasses for reading. Does not need glasses when she is not reading. 01-30-2020: Doing well post cataract surgery bilateral eyes. The patient states she is done very well.   . Provided disease specific education to patient: review of heart healthy/ADA diet.  The patient admits she does not always adhere to a heart healthy/ADA diet. She does try to eat a balanced diet. She lives with her son and her sister and they take turns cooking. She admits she doesn't always "eat right" but she is trying to be more mindful of what she is eating.  Will send information by my chart and EMMI for review on heart healthy/ADA diet. 01-30-2020: Review of heart healthy/ADA diet and healthy food choices.  . Review of blood sugar readings. The patient states fasting her blood sugars are around 130 and consistently stay that range. She does not usually check during the day or at night. Denies any episodes of hyperglycemia or hypoglycemia. 01-30-2020: The patient states that her blood sugars are 160-175 and most recent Hemoglobin A1C was 7.6. The patient verbalized that she had not been good. Education on ideal Hemogloving A1C  for diabetes less than 7.0 and fasting blood sugar <130 and post prandial <180.  The patient states that she is working on getting her blood sugars under better control. Education and support given.  . Review of blood pressure readings. The patient takes her blood pressure regularly. States that last reading was 128/68.  She states she has good blood pressure control. 01-30-2020: The patient states that her blood pressures  are WNL. Denies any issues with blood pressure control.  . Evaluation of activity level. The patient mows her yard weekly. She has a riding mower. It usually takes her about 3 hours to mow. Sometimes she splits it up into 2 days. The patient states that she doesn't mow when it is hot outside and denies any concerns with safety.  Nash Dimmer with appropriate clinical care team members regarding patient needs- has no needs at this time but knows pharmacist and LCSW are available to help if needed.   Patient Self Care Activities related to HTN, HLD, and DMII:  . Patient is unable to independently self-manage chronic health conditions  Please see past updates related to this goal by clicking on the "Past Updates" button in the selected goal         Patient verbalizes understanding of instructions provided today.   Telephone follow up appointment with care management team member scheduled for: 04-09-2020 at 0900 am  Noreene Larsson RN, MSN, [facility] Mobile: [phone]   Diabetes Mellitus and Nutrition, Adult When you have diabetes (diabetes mellitus), it is very important to have healthy eating habits because your blood sugar (glucose) levels are greatly affected by what you eat and drink. Eating healthy foods in the appropriate amounts, at about the same times every day, can help you: Control your blood glucose. Lower your risk of heart disease. Improve your blood pressure. Reach or maintain a healthy weight. Every person with diabetes is different, and each person has different needs for a meal plan. Your health care provider may recommend that you work with a diet and nutrition specialist (dietitian) to make a meal plan that is best for you. Your meal plan may vary depending on factors such as: The calories you need. The medicines you take. Your weight. Your blood glucose, blood pressure, and  cholesterol levels. Your activity level. Other health conditions you have, such as heart or kidney disease. How do carbohydrates affect me? Carbohydrates, also called carbs, affect your blood glucose level more than any other type of food. Eating carbs naturally raises the amount of glucose in your blood. Carb counting is a method for keeping track of how many carbs you eat. Counting carbs is important to keep your blood glucose at a healthy level, especially if you use insulin or take certain oral diabetes medicines. It is important to know how many carbs you can safely have in each meal. This is different for every person. Your dietitian can help you calculate how many carbs you should have at each meal and for each snack. Foods that contain carbs include: Bread, cereal, rice, pasta, and crackers. Potatoes and corn. Peas, beans, and lentils. Milk and yogurt. Fruit and juice. Desserts, such as cakes, cookies, ice cream, and candy. How does alcohol affect me? Alcohol can cause a sudden decrease in blood glucose (hypoglycemia), especially if you use insulin or take certain oral diabetes medicines. Hypoglycemia can be a life-threatening condition. Symptoms of hypoglycemia (sleepiness, dizziness, and confusion) are  similar to symptoms of having too much alcohol. If your health care provider says that alcohol is safe for you, follow these guidelines: Limit alcohol intake to no more than 1 drink per day for nonpregnant women and 2 drinks per day for men. One drink equals 12 oz of beer, 5 oz of wine, or 1 oz of hard liquor. Do not drink on an empty stomach. Keep yourself hydrated with water, diet soda, or unsweetened iced tea. Keep in mind that regular soda, juice, and other mixers may contain a lot of sugar and must be counted as carbs. What are tips for following this plan?  Reading food labels Start by checking the serving size on the "Nutrition Facts" label of packaged foods and drinks. The  amount of calories, carbs, fats, and other nutrients listed on the label is based on one serving of the item. Many items contain more than one serving per package. Check the total grams (g) of carbs in one serving. You can calculate the number of servings of carbs in one serving by dividing the total carbs by 15. For example, if a food has 30 g of total carbs, it would be equal to 2 servings of carbs. Check the number of grams (g) of saturated and trans fats in one serving. Choose foods that have low or no amount of these fats. Check the number of milligrams (mg) of salt (sodium) in one serving. Most people should limit total sodium intake to less than 2,300 mg per day. Always check the nutrition information of foods labeled as "low-fat" or "nonfat". These foods may be higher in added sugar or refined carbs and should be avoided. Talk to your dietitian to identify your daily goals for nutrients listed on the label. Shopping Avoid buying canned, premade, or processed foods. These foods tend to be high in fat, sodium, and added sugar. Shop around the outside edge of the grocery store. This includes fresh fruits and vegetables, bulk grains, fresh meats, and fresh dairy. Cooking Use low-heat cooking methods, such as baking, instead of high-heat cooking methods like deep frying. Cook using healthy oils, such as olive, canola, or sunflower oil. Avoid cooking with butter, cream, or high-fat meats. Meal planning Eat meals and snacks regularly, preferably at the same times every day. Avoid going long periods of time without eating. Eat foods high in fiber, such as fresh fruits, vegetables, beans, and whole grains. Talk to your dietitian about how many servings of carbs you can eat at each meal. Eat 4-6 ounces (oz) of lean protein each day, such as lean meat, chicken, fish, eggs, or tofu. One oz of lean protein is equal to: 1 oz of meat, chicken, or fish. 1 egg.  cup of tofu. Eat some foods each day that  contain healthy fats, such as avocado, nuts, seeds, and fish. Lifestyle Check your blood glucose regularly. Exercise regularly as told by your health care provider. This may include: 150 minutes of moderate-intensity or vigorous-intensity exercise each week. This could be brisk walking, biking, or water aerobics. Stretching and doing strength exercises, such as yoga or weightlifting, at least 2 times a week. Take medicines as told by your health care provider. Do not use any products that contain nicotine or tobacco, such as cigarettes and e-cigarettes. If you need help quitting, ask your health care provider. Work with a Social worker or diabetes educator to identify strategies to manage stress and any emotional and social challenges. Questions to ask a health care provider Do I  need to meet with a diabetes educator? Do I need to meet with a dietitian? What number can I call if I have questions? When are the best times to check my blood glucose? Where to find more information: American Diabetes Association: diabetes.org Academy of Nutrition and Dietetics: www.eatright.[employer] of Diabetes and Digestive and Kidney Diseases (NIH): [employer] Summary A healthy meal plan will help you control your blood glucose and maintain a healthy lifestyle. Working with a diet and nutrition specialist (dietitian) can help you make a meal plan that is best for you. Keep in mind that carbohydrates (carbs) and alcohol have immediate effects on your blood glucose levels. It is important to count carbs and to use alcohol carefully. This information is not intended to replace advice given to you by your health care provider. Make sure you discuss any questions you have with your health care provider. Document Revised: 03/20/2017 Document Reviewed: 05/12/2016 Elsevier Patient Education  2020 [employer].

## 2020-01-30 NOTE — Chronic Care Management (AMB) (Signed)
Chronic Care Management   Follow Up Note   01/30/2020 Name: Andrea Dodson MRN: [id number] DOB: [birth year]  Referred by: Olin Hauser, DO Reason for referral : Chronic Care Management (RNCM: Follow up for Chronic Disease Management and Care Coordination Needs )   Andrea Dodson is a 69 y.o. year old female who is a primary care patient of Olin Hauser, DO. The CCM team was consulted for assistance with chronic disease management and care coordination needs.    Review of patient status, including review of consultants reports, relevant laboratory and other test results, and collaboration with appropriate care team members and the patient's provider was performed as part of comprehensive patient evaluation and provision of chronic care management services.    SDOH (Social Determinants of Health) assessments performed: Yes See Care Plan activities for detailed interventions related to [facility])     Outpatient Encounter Medications as of 01/30/2020  Medication Sig  . aspirin EC 81 MG tablet Take 1 tablet (81 mg total) by mouth daily.  . Blood Glucose Monitoring Suppl (ONE TOUCH ULTRA 2) w/Device KIT 1 device for checking blood sugar once daily  . fluticasone (FLONASE) 50 MCG/ACT nasal spray Place 2 sprays into both nostrils daily.  Marland Kitchen glucose blood (ONE TOUCH ULTRA TEST) test strip CHECK BLOOD SUGAR ONCE DAILY  . Krill Oil 350 MG CAPS Take 350 mg by mouth daily.  . Lancets (ONETOUCH ULTRASOFT) lancets Use as instructed  . loratadine (CLARITIN) 10 MG tablet Take 1 tablet (10 mg total) by mouth daily. (Patient not taking: Reported on 07/28/2019)  . losartan (COZAAR) 50 MG tablet TAKE 1 TABLET BY MOUTH  DAILY  . metFORMIN (GLUCOPHAGE) 500 MG tablet TAKE 1 TABLET BY MOUTH  TWICE DAILY WITH A MEAL  . rosuvastatin (CRESTOR) 10 MG tablet TAKE 1 TABLET BY MOUTH DAILY   No facility-administered encounter medications on file as of 01/30/2020.     Objective:  BP Readings from  Last 3 Encounters:  09/05/19 129/66  08/08/19 (!) 146/64  07/25/19 (!) 132/46   Lab Results  Component Value Date   HGBA1C 7.6 (H) 01/24/2020    Goals Addressed              This Visit's Progress   .  RNCM: Pt: "I haven't checked my blood pressure lately" (pt-stated)        CARE PLAN ENTRY (see longtitudinal plan of care for additional care plan information)  Current Barriers:  . Chronic Disease Management support, education, and care coordination needs related to HTN, HLD, and DMII  Clinical Goal(s) related to HTN, HLD, and DMII:  Over the next 120 days, patient will:  . Work with the care management team to address educational, disease management, and care coordination needs  . Begin or continue self health monitoring activities as directed today Measure and record cbg (blood glucose) 1 times daily, Measure and record blood pressure 2 times per week, and adhere to a heart healthy/ADA diet . Call provider office for new or worsened signs and symptoms Blood glucose findings outside established parameters, Blood pressure findings outside established parameters, and New or worsened symptom related to HLD and other chronic conditions . Call care management team with questions or concerns . Verbalize basic understanding of patient centered plan of care established today  Interventions related to HTN, HLD, and DMII:  . Evaluation of current treatment plans and patient's adherence to plan as established by provider.  The patient is doing well. Denies any new concerns. Feels  like she is managing her health well. 01-30-2020: The patient states that her hemoglobin A1C is up to 7.6 and it is because she has not been good. The patient states with her recent cataract surgery she was off with her eating but wants to get back on track.   . Assessed patient understanding of disease states.  Has a good understanding of her chronic conditions.  . Assessed patient's education and care coordination  needs.  The patient denies any care coordination needs. Review of heart healthy/ADA Diet.  01-30-2020: The patient states that she has not been "good" with her eating. The patient states that she is going to do better. Her blood sugars have been 160-175 and she knows she has got to do better with diabetes control.  . Review of upcoming appointments: appointment with pcp on 02-01-2020.  Marland Kitchen Evaluation of cataract surgery. The patient states she is doing very well and can actually see now. Is planning on getting new lens for her glasses for reading. Does not need glasses when she is not reading. 01-30-2020: Doing well post cataract surgery bilateral eyes. The patient states she is done very well.   . Provided disease specific education to patient: review of heart healthy/ADA diet.  The patient admits she does not always adhere to a heart healthy/ADA diet. She does try to eat a balanced diet. She lives with her son and her sister and they take turns cooking. She admits she doesn't always "eat right" but she is trying to be more mindful of what she is eating.  Will send information by my chart and EMMI for review on heart healthy/ADA diet. 01-30-2020: Review of heart healthy/ADA diet and healthy food choices.  . Review of blood sugar readings. The patient states fasting her blood sugars are around 130 and consistently stay that range. She does not usually check during the day or at night. Denies any episodes of hyperglycemia or hypoglycemia. 01-30-2020: The patient states that her blood sugars are 160-175 and most recent Hemoglobin A1C was 7.6. The patient verbalized that she had not been good. Education on ideal Hemogloving A1C for diabetes less than 7.0 and fasting blood sugar <130 and post prandial <180.  The patient states that she is working on getting her blood sugars under better control. Education and support given.  . Review of blood pressure readings. The patient takes her blood pressure regularly. States  that last reading was 128/68.  She states she has good blood pressure control. 01-30-2020: The patient states that her blood pressures are WNL. Denies any issues with blood pressure control.  . Evaluation of activity level. The patient mows her yard weekly. She has a riding mower. It usually takes her about 3 hours to mow. Sometimes she splits it up into 2 days. The patient states that she doesn't mow when it is hot outside and denies any concerns with safety.  Nash Dimmer with appropriate clinical care team members regarding patient needs- has no needs at this time but knows pharmacist and LCSW are available to help if needed.   Patient Self Care Activities related to HTN, HLD, and DMII:  . Patient is unable to independently self-manage chronic health conditions  Please see past updates related to this goal by clicking on the "Past Updates" button in the selected goal          Plan:   Telephone follow up appointment with care management team member scheduled for: 04-09-2020 at 0900 am   Noreene Larsson RN,  MSN, [facility] Mobile: [phone]

## 2020-02-01 ENCOUNTER — Encounter: Payer: Self-pay | Admitting: Family Medicine

## 2020-02-01 ENCOUNTER — Ambulatory Visit (INDEPENDENT_AMBULATORY_CARE_PROVIDER_SITE_OTHER): Payer: Medicare Other | Admitting: Family Medicine

## 2020-02-01 ENCOUNTER — Other Ambulatory Visit: Payer: Self-pay

## 2020-02-01 VITALS — BP 127/55 | HR 67 | Temp 97.1°F | Resp 16 | Ht 62.0 in | Wt 159.0 lb

## 2020-02-01 DIAGNOSIS — Z0000 Encounter for general adult medical examination without abnormal findings: Secondary | ICD-10-CM | POA: Diagnosis not present

## 2020-02-01 DIAGNOSIS — Z23 Encounter for immunization: Secondary | ICD-10-CM | POA: Diagnosis not present

## 2020-02-01 DIAGNOSIS — H6122 Impacted cerumen, left ear: Secondary | ICD-10-CM

## 2020-02-01 DIAGNOSIS — Z1211 Encounter for screening for malignant neoplasm of colon: Secondary | ICD-10-CM

## 2020-02-01 DIAGNOSIS — I10 Essential (primary) hypertension: Secondary | ICD-10-CM

## 2020-02-01 DIAGNOSIS — E663 Overweight: Secondary | ICD-10-CM

## 2020-02-01 DIAGNOSIS — E1169 Type 2 diabetes mellitus with other specified complication: Secondary | ICD-10-CM | POA: Diagnosis not present

## 2020-02-01 DIAGNOSIS — E785 Hyperlipidemia, unspecified: Secondary | ICD-10-CM

## 2020-02-01 NOTE — Patient Instructions (Addendum)
Thank you for coming to the office today.  Recent Labs    05/31/19 0000 07/25/19 1404 01/24/20 0951  HGBA1C 6.7 7.1* 7.6*    Ordered Cologuard - if you don't receive it within 1-2 weeks, call the company and [place] if needed.  Flu Shot today  You have thick impacted ear wax (cerumen) blocking LEFT ear canal and ear drum. This is the most likely cause of reduced hearing and ear pain and discomfort.  Try OTC Debrox ear drops (Carbamide peroxide), use (LEFT ONLY)  on both sides following instructions on bottle, pharmacist will direct you to the appropriate ear drops if you need help. May take a week or more.  Avoid using Q-tips inside ears, as this can push wax deeper, but you can try to use rolled up kleenex as a wick to absorb fluid and wax as well.  If you are not making progress with ear wax removal at home, or the problem keeps coming back, please notify our office or return for re-evaluation, and we can discuss referral to ENT office for more formal ear wax removal.   Please schedule a Follow-up Appointment to: Return in about 6 months (around 08/01/2020) for 6 month DM A1c.  If you have any other questions or concerns, please feel free to call the office or send a message through [place]. You may also schedule an earlier appointment if necessary.  Additionally, you may be receiving a survey about your experience at our office within a few days to 1 week by e-mail or mail. We value your feedback.  [name], DO [place]

## 2020-02-01 NOTE — Assessment & Plan Note (Signed)
Elevated A1c 7.7, still at goal 7-8, goal to improve diet No hypoglycemia Complications - other including hyperlipidemia, obesity - increases risk of future cardiovascular complications   Plan:  1. Continue Metformin IR 500mg  AM and 500mg  x 2 = 1000mg  PM 2. Encourage improved lifestyle - low carb, low sugar diet, reduce portion size, continue improving regular exercise 3. Check CBG, bring log to next visit for review 4. Continue ASA, ARB, Statin 5. DM Foot exam 6. UTD DM Eye exam

## 2020-02-01 NOTE — Progress Notes (Signed)
Subjective:    Patient ID: Andrea Dodson, female    DOB: 04-07-1951, 69 y.o.   MRN: [id number]  Andrea Dodson is a 69 y.o. female presenting on 02/01/2020 for Annual Exam   HPI   Here for Annual Physical and Lab Review.   CHRONIC DM, Type 2 / Overweight BMI >29 Hyperlipidemia  Reports some concern with lifestyle. Had prior avg 6.5 range now last reading was 7.0 due for A1c today CBGs:stable readings Meds: Metformin 570m AM / 10033mPM Tolerating well w/o side-effects CurrentlyARB/ASA daily, on Statin - Rosuvastatin 1036mifestyle: - Weightstable to improved - Diet improved DM diet. Largest mea of day is afternoon/lunch, Occasional sweets - Exercise (walking now goal to resume exercise) Denies hypoglycemia  CHRONIC HTN: Reportsno new concerns.Home BP normal Current Meds -Losartan 37m40mily - Prior history w/ ACEi cough on Lisinopril Reports good compliance, took meds today. Tolerating well, w/o complaints.   Health Maintenance:   PMH - Seasonal allergies - taking Loratadine 10mg25mly, controls her allergy symptoms, has done well this season   Health Maintenance:  Last cologuard 07/21/16 negative, due repeat now, ordered  Due for Flu Shot, will receive today   Mammogram UTD 09/2019  UTD Pneumonia vaccine series.  [facility] 07/25/2019 07/21/2019  Decreased Interest 0 0 0  Down, Depressed, Hopeless 0 0 0  PHQ - 2 Score 0 0 0    Past Medical History:  Diagnosis Date  . Allergy   . Arthritis    right knee  . Diabetes mellitus, type 2 (HCC) [place] Hypertension   . Urinary incontinence   . Wears dentures    full upper and lower   Past Surgical History:  Procedure Laterality Date  . APPENDECTOMY    . CATARACT EXTRACTION W/PHACO Left 08/08/2019   Procedure: CATARACT EXTRACTION PHACO AND INTRAOCULAR LENS PLACEMENT (IOC) LEFT 4.59  00:32.3;  Surgeon: King,Eulogio Bear  [place];  Laterality: Left;  Diabetic - oral meds  . CATARACT EXTRACTION W/PHACO Right 09/05/2019   Procedure: CATARACT EXTRACTION PHACO AND INTRAOCULAR LENS PLACEMENT (IOC) RIGHT DIABETIC 4.65  00:34.2;  Surgeon: King,Eulogio Bear  [place];  Laterality: Right;  Diabetic - oral meds  . CHOLECYSTECTOMY  1993  . TONSILLECTOMY     Social History   Socioeconomic History  . Marital status: Widowed    Spouse name: Not on file  . Number of children: Not on file  . Years of education: Not on file  . Highest education level: Some college, no degree  Occupational History  . Occupation: retired  Tobacco Use  . Smoking status: Never Smoker  . Smokeless tobacco: Never Used  Vaping Use  . Vaping Use: Never used  Substance and Sexual Activity  . Alcohol use: No  . Drug use: No  . Sexual activity: Not on file  Other Topics Concern  . Not on file  Social History Narrative  . Not on file   Social Determinants of Health   Financial Resource Strain: Low Risk   . Difficulty of Paying Living Expenses: Not hard at all  Food Insecurity: No Food Insecurity  . Worried About RunniCharity fundraiserhe Last Year: Never true  . Ran Out of Food in the Last Year: Never true  Transportation Needs: No Transportation Needs  . Lack of Transportation (Medical): No  . Lack of Transportation (Non-Medical): No  Physical  Activity: Inactive  . Days of Exercise per Week: 0 days  . Minutes of Exercise per Session: 0 min  Stress: No Stress Concern Present  . Feeling of Stress : Not at all  Social Connections: Socially Isolated  . Frequency of Communication with Friends and Family: More than three times a week  . Frequency of Social Gatherings with Friends and Family: More than three times a week  . Attends Religious Services: Never  . Active Member of Clubs or Organizations: No  . Attends Archivist Meetings: Never  . Marital Status: Widowed    Intimate Partner Violence: Not At Risk  . Fear of Current or Ex-Partner: No  . Emotionally Abused: No  . Physically Abused: No  . Sexually Abused: No   Family History  Problem Relation Age of Onset  . Heart failure Mother   . Heart disease Mother   . Diabetes Mother   . Heart attack Mother   . Cancer Father        lung  . Heart attack Sister   . Diabetes Sister   . Breast cancer Half-Sister    Current Outpatient Medications on File Prior to Visit  Medication Sig  . aspirin EC 81 MG tablet Take 1 tablet (81 mg total) by mouth daily.  . Blood Glucose Monitoring Suppl (ONE TOUCH ULTRA 2) w/Device KIT 1 device for checking blood sugar once daily  . fluticasone (FLONASE) 50 MCG/ACT nasal spray Place 2 sprays into both nostrils daily.  Marland Kitchen glucose blood (ONE TOUCH ULTRA TEST) test strip CHECK BLOOD SUGAR ONCE DAILY  . Krill Oil 350 MG CAPS Take 350 mg by mouth daily.  . Lancets (ONETOUCH ULTRASOFT) lancets Use as instructed  . loratadine (CLARITIN) 10 MG tablet Take 1 tablet (10 mg total) by mouth daily.  Marland Kitchen losartan (COZAAR) 50 MG tablet TAKE 1 TABLET BY MOUTH  DAILY  . metFORMIN (GLUCOPHAGE) 500 MG tablet TAKE 1 TABLET BY MOUTH  TWICE DAILY WITH A MEAL  . rosuvastatin (CRESTOR) 10 MG tablet TAKE 1 TABLET BY MOUTH DAILY   No current facility-administered medications on file prior to visit.    Review of Systems  Constitutional: Negative for activity change, appetite change, chills, diaphoresis, fatigue and fever.  HENT: Negative for congestion and hearing loss.   Eyes: Negative for visual disturbance.  Respiratory: Negative for apnea, cough, chest tightness, shortness of breath and wheezing.   Cardiovascular: Negative for chest pain, palpitations and leg swelling.  Gastrointestinal: Negative for abdominal pain, anal bleeding, blood in stool, constipation, diarrhea, nausea and vomiting.  Endocrine: Negative for cold intolerance.  Genitourinary: Negative for difficulty urinating,  dysuria, frequency and hematuria.  Musculoskeletal: Negative for arthralgias, back pain and neck pain.  Skin: Negative for rash.  Allergic/Immunologic: Negative for environmental allergies.  Neurological: Negative for dizziness, weakness, light-headedness, numbness and headaches.  Hematological: Negative for adenopathy.  Psychiatric/Behavioral: Negative for behavioral problems, dysphoric mood and sleep disturbance. The patient is not nervous/anxious.    Per HPI unless specifically indicated above      Objective:    BP (!) 127/55   Pulse 67   Temp (!) 97.1 F (36.2 C) (Temporal)   Resp 16   Ht 5' 2" (1.575 m)   Wt 159 lb (72.1 kg)   SpO2 100%   BMI 29.08 kg/m   Wt Readings from Last 3 Encounters:  02/01/20 159 lb (72.1 kg)  09/05/19 162 lb (73.5 kg)  08/08/19 160 lb (72.6 kg)    Physical  Exam Vitals and nursing note reviewed.  Constitutional:      General: She is not in acute distress.    Appearance: She is well-developed. She is not diaphoretic.     Comments: Well-appearing, comfortable, cooperative  HENT:     Head: Normocephalic and atraumatic.  Eyes:     General:        Right eye: No discharge.        Left eye: No discharge.     Conjunctiva/sclera: Conjunctivae normal.     Pupils: Pupils are equal, round, and reactive to light.  Neck:     Thyroid: No thyromegaly.  Cardiovascular:     Rate and Rhythm: Normal rate and regular rhythm.     Heart sounds: Normal heart sounds. No murmur heard.   Pulmonary:     Effort: Pulmonary effort is normal. No respiratory distress.     Breath sounds: Normal breath sounds. No wheezing or rales.  Abdominal:     General: Bowel sounds are normal. There is no distension.     Palpations: Abdomen is soft. There is no mass.     Tenderness: There is no abdominal tenderness.  Musculoskeletal:        General: No tenderness. Normal range of motion.     Cervical back: Normal range of motion and neck supple.     Comments: Upper / Lower  Extremities: - Normal muscle tone, strength bilateral upper extremities 5/5, lower extremities 5/5  Lymphadenopathy:     Cervical: No cervical adenopathy.  Skin:    General: Skin is warm and dry.     Findings: No erythema or rash.  Neurological:     Mental Status: She is alert and oriented to person, place, and time.     Comments: Distal sensation intact to light touch all extremities  Psychiatric:        Behavior: Behavior normal.     Comments: Well groomed, good eye contact, normal speech and thoughts        Results for orders placed or performed in visit on 01/23/20  TSH  Result Value Ref Range   TSH 2.33 0.40 - 4.50 mIU/L  Lipid panel  Result Value Ref Range   Cholesterol 123 <200 mg/dL   HDL 51 > OR = 50 mg/dL   Triglycerides 165 (H) <150 mg/dL   LDL Cholesterol (Calc) 48 mg/dL (calc)   Total CHOL/HDL Ratio 2.4 <5.0 (calc)   Non-HDL Cholesterol (Calc) 72 <130 mg/dL (calc)  COMPLETE METABOLIC PANEL WITH GFR  Result Value Ref Range   Glucose, Bld 161 (H) 65 - 99 mg/dL   BUN 14 7 - 25 mg/dL   Creat 0.83 0.50 - 0.99 mg/dL   GFR, Est Non African American 72 > OR = 60 mL/min/1.73m2   GFR, Est African American 84 > OR = 60 mL/min/1.73m2   BUN/Creatinine Ratio NOT APPLICABLE 6 - 22 (calc)   Sodium 137 135 - 146 mmol/L   Potassium 4.3 3.5 - 5.3 mmol/L   Chloride 103 98 - 110 mmol/L   CO2 22 20 - 32 mmol/L   Calcium 9.4 8.6 - 10.4 mg/dL   Total Protein 6.7 6.1 - 8.1 g/dL   Albumin 4.2 3.6 - 5.1 g/dL   Globulin 2.5 1.9 - 3.7 g/dL (calc)   AG Ratio 1.7 1.0 - 2.5 (calc)   Total Bilirubin 0.6 0.2 - 1.2 mg/dL   Alkaline phosphatase (APISO) 93 37 - 153 U/L   AST 24 10 - 35 U/L   ALT   10 6 - 29 U/L  CBC with Differential/Platelet  Result Value Ref Range   WBC 6.9 3.8 - 10.8 Thousand/uL   RBC 4.18 3.80 - 5.10 Million/uL   Hemoglobin 12.7 11.7 - 15.5 g/dL   HCT 37.5 35 - 45 %   MCV 89.7 80.0 - 100.0 fL   MCH 30.4 27.0 - 33.0 pg   MCHC 33.9 32.0 - 36.0 g/dL   RDW 13.0 11.0  - 15.0 %   Platelets 281 140 - 400 Thousand/uL   MPV 8.9 7.5 - 12.5 fL   Neutro Abs 4,319 1,500 - 7,800 cells/uL   Lymphs Abs 1,718 850 - 3,900 cells/uL   Absolute Monocytes 531 200 - 950 cells/uL   Eosinophils Absolute 283 15.0 - 500.0 cells/uL   Basophils Absolute 48 0.0 - 200.0 cells/uL   Neutrophils Relative % 62.6 %   Total Lymphocyte 24.9 %   Monocytes Relative 7.7 %   Eosinophils Relative 4.1 %   Basophils Relative 0.7 %  Hemoglobin A1c  Result Value Ref Range   Hgb A1c MFr Bld 7.6 (H) <5.7 % of total Hgb   Mean Plasma Glucose 171 (calc)   eAG (mmol/L) 9.5 (calc)      Assessment & Plan:   Problem List Items Addressed This Visit    Type 2 diabetes mellitus with other specified complication (HCC)    Elevated A1c 7.7, still at goal 7-8, goal to improve diet No hypoglycemia Complications - other including hyperlipidemia, obesity - increases risk of future cardiovascular complications   Plan:  1. Continue Metformin IR 500mg AM and 500mg x 2 = 1000mg PM 2. Encourage improved lifestyle - low carb, low sugar diet, reduce portion size, continue improving regular exercise 3. Check CBG, bring log to next visit for review 4. Continue ASA, ARB, Statin 5. DM Foot exam 6. UTD DM Eye exam      Overweight (BMI 25.0-29.9)   Hyperlipidemia associated with type 2 diabetes mellitus (HCC)    Controlled lipids on statin and lifestyle Fam history HyperTG The ASCVD Risk score (Goff DC Jr., et al., 2013) failed to calculate for the following reasons:   The valid total cholesterol range is 130 to 320 mg/dL   Plan: 1. Continue Rosuvastatin 10mg nightly 2. Continue ASA 81mg for primary ASCVD risk reduction 3. Encourage improved lifestyle - low carb/cholesterol, reduce portion size, continue improving regular exercise      Essential hypertension    Controlled Home readings infrequent but controlled No known complications  Failed: Lisinopril (ACEi cough)    Plan:  1. Continue  current BP regimen Losartan 50mg daily 2. Encourage improved lifestyle - low sodium diet, regular exercise 3. Continue monitor BP outside office, bring readings to next visit, if persistently >140/90 or new symptoms notify office sooner      Colon cancer screening    Other Visit Diagnoses    Annual physical exam    -  Primary   Needs flu shot       Relevant Orders   Flu Vaccine QUAD High Dose(Fluad) (Completed)   Screening for colon cancer       Relevant Orders   Cologuard   Left ear impacted cerumen          Updated Health Maintenance information - Flu shot today  Due for routine colon cancer screening. Last cologuard 2018 - Discussion today about recommendations for either Colonoscopy or Cologuard screening, benefits and risks of screening, interested in Cologuard, understands that if positive then recommendation   is for diagnostic colonoscopy to follow-up. - Ordered Cologuard today  Reviewed recent lab results with patient Encouraged improvement to lifestyle with diet and exercise - Goal of weight loss  #Cerumen L ear impaction - trial home debrox flushing kit, can f/u if unresolved  No orders of the defined types were placed in this encounter.     Follow up plan: Return in about 6 months (around 08/01/2020) for 6 month DM A1c.  [name], [facility] 02/01/2020, 2:22 PM

## 2020-02-02 NOTE — Assessment & Plan Note (Signed)
Controlled lipids on statin and lifestyle Fam history HyperTG The ASCVD Risk score Mikey Bussing DC Jr., et al., 2013) failed to calculate for the following reasons:   The valid total cholesterol range is 130 to 320 mg/dL   Plan: 1. Continue Rosuvastatin 10mg  nightly 2. Continue ASA 81mg  for primary ASCVD risk reduction 3. Encourage improved lifestyle - low carb/cholesterol, reduce portion size, continue improving regular exercise

## 2020-02-02 NOTE — Assessment & Plan Note (Signed)
Controlled Home readings infrequent but controlled No known complications  Failed: Lisinopril (ACEi cough)    Plan:  1. Continue current BP regimen Losartan 50mg  daily 2. Encourage improved lifestyle - low sodium diet, regular exercise 3. Continue monitor BP outside office, bring readings to next visit, if persistently >140/90 or new symptoms notify office sooner

## 2020-02-15 DIAGNOSIS — Z1211 Encounter for screening for malignant neoplasm of colon: Secondary | ICD-10-CM | POA: Diagnosis not present

## 2020-02-15 LAB — COLOGUARD: Cologuard: NEGATIVE

## 2020-02-21 ENCOUNTER — Ambulatory Visit (INDEPENDENT_AMBULATORY_CARE_PROVIDER_SITE_OTHER): Payer: Medicare Other

## 2020-02-21 VITALS — Ht 62.0 in | Wt 156.0 lb

## 2020-02-21 DIAGNOSIS — Z0000 Encounter for general adult medical examination without abnormal findings: Secondary | ICD-10-CM

## 2020-02-21 NOTE — Patient Instructions (Signed)
Andrea Dodson , Thank you for taking time to come for your Medicare Wellness Visit. I appreciate your ongoing commitment to your health goals. Please review the following plan we discussed and let me know if I can assist you in the future.   Screening recommendations/referrals: Colonoscopy: just mailed out cologuard Mammogram: completed 10/06/2019, due 10/05/2020 Bone Density: completed 04/21/2006 Recommended yearly ophthalmology/optometry visit for glaucoma screening and checkup Recommended yearly dental visit for hygiene and checkup  Vaccinations: Influenza vaccine: completed 02/01/2020, due 11/19/2020 Pneumococcal vaccine: completed 08/10/2017 Tdap vaccine: completed 07/11/2016, due 07/12/2026 Shingles vaccine: discussed   Covid-19: 10/17/2019, 09/19/2019  Advanced directives: Advance directive discussed with you today.   Conditions/risks identified: none  Next appointment: Follow up in one year for your annual wellness visit    Preventive Care 65 Years and Older, Female Preventive care refers to lifestyle choices and visits with your health care provider that can promote health and wellness. What does preventive care include?  A yearly physical exam. This is also called an annual well check.  Dental exams once or twice a year.  Routine eye exams. Ask your health care provider how often you should have your eyes checked.  Personal lifestyle choices, including:  Daily care of your teeth and gums.  Regular physical activity.  Eating a healthy diet.  Avoiding tobacco and drug use.  Limiting alcohol use.  Practicing safe sex.  Taking low-dose aspirin every day.  Taking vitamin and mineral supplements as recommended by your health care provider. What happens during an annual well check? The services and screenings done by your health care provider during your annual well check will depend on your age, overall health, lifestyle risk factors, and family history of  disease. Counseling  Your health care provider may ask you questions about your:  Alcohol use.  Tobacco use.  Drug use.  Emotional well-being.  Home and relationship well-being.  Sexual activity.  Eating habits.  History of falls.  Memory and ability to understand (cognition).  Work and work Statistician.  Reproductive health. Screening  You may have the following tests or measurements:  Height, weight, and BMI.  Blood pressure.  Lipid and cholesterol levels. These may be checked every 5 years, or more frequently if you are over 26 years old.  Skin check.  Lung cancer screening. You may have this screening every year starting at age 44 if you have a 30-pack-year history of smoking and currently smoke or have quit within the past 15 years.  Fecal occult blood test (FOBT) of the stool. You may have this test every year starting at age 35.  Flexible sigmoidoscopy or colonoscopy. You may have a sigmoidoscopy every 5 years or a colonoscopy every 10 years starting at age 62.  Hepatitis C blood test.  Hepatitis B blood test.  Sexually transmitted disease (STD) testing.  Diabetes screening. This is done by checking your blood sugar (glucose) after you have not eaten for a while (fasting). You may have this done every 1-3 years.  Bone density scan. This is done to screen for osteoporosis. You may have this done starting at age 75.  Mammogram. This may be done every 1-2 years. Talk to your health care provider about how often you should have regular mammograms. Talk with your health care provider about your test results, treatment options, and if necessary, the need for more tests. Vaccines  Your health care provider may recommend certain vaccines, such as:  Influenza vaccine. This is recommended every year.  Tetanus,  diphtheria, and acellular pertussis (Tdap, Td) vaccine. You may need a Td booster every 10 years.  Zoster vaccine. You may need this after age  79.  Pneumococcal 13-valent conjugate (PCV13) vaccine. One dose is recommended after age 73.  Pneumococcal polysaccharide (PPSV23) vaccine. One dose is recommended after age 10. Talk to your health care provider about which screenings and vaccines you need and how often you need them. This information is not intended to replace advice given to you by your health care provider. Make sure you discuss any questions you have with your health care provider. Document Released: 05/04/2015 Document Revised: 12/26/2015 Document Reviewed: 02/06/2015 Elsevier Interactive Patient Education  2017 [place] Prevention in the Home Falls can cause injuries. They can happen to people of all ages. There are many things you can do to make your home safe and to help prevent falls. What can I do on the outside of my home?  Regularly fix the edges of walkways and driveways and fix any cracks.  Remove anything that might make you trip as you walk through a door, such as a raised step or threshold.  Trim any bushes or trees on the path to your home.  Use bright outdoor lighting.  Clear any walking paths of anything that might make someone trip, such as rocks or tools.  Regularly check to see if handrails are loose or broken. Make sure that both sides of any steps have handrails.  Any raised decks and porches should have guardrails on the edges.  Have any leaves, snow, or ice cleared regularly.  Use sand or salt on walking paths during winter.  Clean up any spills in your garage right away. This includes oil or grease spills. What can I do in the bathroom?  Use night lights.  Install grab bars by the toilet and in the tub and shower. Do not use towel bars as grab bars.  Use non-skid mats or decals in the tub or shower.  If you need to sit down in the shower, use a plastic, non-slip stool.  Keep the floor dry. Clean up any water that spills on the floor as soon as it happens.  Remove  soap buildup in the tub or shower regularly.  Attach bath mats securely with double-sided non-slip rug tape.  Do not have throw rugs and other things on the floor that can make you trip. What can I do in the bedroom?  Use night lights.  Make sure that you have a light by your bed that is easy to reach.  Do not use any sheets or blankets that are too big for your bed. They should not hang down onto the floor.  Have a firm chair that has side arms. You can use this for support while you get dressed.  Do not have throw rugs and other things on the floor that can make you trip. What can I do in the kitchen?  Clean up any spills right away.  Avoid walking on wet floors.  Keep items that you use a lot in easy-to-reach places.  If you need to reach something above you, use a strong step stool that has a grab bar.  Keep electrical cords out of the way.  Do not use floor polish or wax that makes floors slippery. If you must use wax, use non-skid floor wax.  Do not have throw rugs and other things on the floor that can make you trip. What can I do with  my stairs?  Do not leave any items on the stairs.  Make sure that there are handrails on both sides of the stairs and use them. Fix handrails that are broken or loose. Make sure that handrails are as long as the stairways.  Check any carpeting to make sure that it is firmly attached to the stairs. Fix any carpet that is loose or worn.  Avoid having throw rugs at the top or bottom of the stairs. If you do have throw rugs, attach them to the floor with carpet tape.  Make sure that you have a light switch at the top of the stairs and the bottom of the stairs. If you do not have them, ask someone to add them for you. What else can I do to help prevent falls?  Wear shoes that:  Do not have high heels.  Have rubber bottoms.  Are comfortable and fit you well.  Are closed at the toe. Do not wear sandals.  If you use a  stepladder:  Make sure that it is fully opened. Do not climb a closed stepladder.  Make sure that both sides of the stepladder are locked into place.  Ask someone to hold it for you, if possible.  Clearly mark and make sure that you can see:  Any grab bars or handrails.  First and last steps.  Where the edge of each step is.  Use tools that help you move around (mobility aids) if they are needed. These include:  Canes.  Walkers.  Scooters.  Crutches.  Turn on the lights when you go into a dark area. Replace any light bulbs as soon as they burn out.  Set up your furniture so you have a clear path. Avoid moving your furniture around.  If any of your floors are uneven, fix them.  If there are any pets around you, be aware of where they are.  Review your medicines with your doctor. Some medicines can make you feel dizzy. This can increase your chance of falling. Ask your doctor what other things that you can do to help prevent falls. This information is not intended to replace advice given to you by your health care provider. Make sure you discuss any questions you have with your health care provider. Document Released: 02/01/2009 Document Revised: 09/13/2015 Document Reviewed: 05/12/2014 Elsevier Interactive Patient Education  2017 [employer].

## 2020-02-21 NOTE — Progress Notes (Signed)
I connected with Andrea Dodson today by telephone and verified that I am speaking with the correct person using two identifiers. Location patient: home Location provider: work Persons participating in the virtual visit: Andrea Dodson, Glenna Durand LPN.   I discussed the limitations, risks, security and privacy concerns of performing an evaluation and management service by telephone and the availability of in person appointments. I also discussed with the patient that there may be a patient responsible charge related to this service. The patient expressed understanding and verbally consented to this telephonic visit.    Interactive audio and video telecommunications were attempted between this provider and patient, however failed, due to patient having technical difficulties OR patient did not have access to video capability.  We continued and completed visit with audio only.     Vital signs may be patient reported or missing.  Subjective:   Andrea Dodson is a 69 y.o. female who presents for Medicare Annual (Subsequent) preventive examination.  Review of Systems      Cardiac Risk Factors include: advanced age (>48mn, >>79women);diabetes mellitus;sedentary lifestyle     Objective:    Today's Vitals   02/21/20 1041  Weight: 156 lb (70.8 kg)  Height: '5\' 2"'  (1.575 m)   Body mass index is 28.53 kg/m.  Advanced Directives 02/21/2020 09/05/2019 08/08/2019 03/30/2019 02/09/2018  Does Patient Have a Medical Advance Directive? No Yes No No No  [place] of [place]ive [place] Does patient want to make changes to medical advance directive? - Yes (MAU/Ambulatory/Procedural Areas - Information given) - - Yes (MAU/Ambulatory/Procedural Areas - Information given)  Would patient like information on creating a medical advance directive? - - Yes (MAU/Ambulatory/Procedural Areas - Information given) - -    Current Medications (verified) Outpatient Encounter  Medications as of 02/21/2020  Medication Sig  . aspirin EC 81 MG tablet Take 1 tablet (81 mg total) by mouth daily.  . Blood Glucose Monitoring Suppl (ONE TOUCH ULTRA 2) w/Device KIT 1 device for checking blood sugar once daily  . fluticasone (FLONASE) 50 MCG/ACT nasal spray Place 2 sprays into both nostrils daily.  .Marland Kitchenglucose blood (ONE TOUCH ULTRA TEST) test strip CHECK BLOOD SUGAR ONCE DAILY  . Krill Oil 350 MG CAPS Take 350 mg by mouth daily.  . Lancets (ONETOUCH ULTRASOFT) lancets Use as instructed  . loratadine (CLARITIN) 10 MG tablet Take 1 tablet (10 mg total) by mouth daily.  .Marland Kitchenlosartan (COZAAR) 50 MG tablet TAKE 1 TABLET BY MOUTH  DAILY  . metFORMIN (GLUCOPHAGE) 500 MG tablet TAKE 1 TABLET BY MOUTH  TWICE DAILY WITH A MEAL (Patient taking differently: Take 1,000 mg by mouth 2 (two) times daily. )  . rosuvastatin (CRESTOR) 10 MG tablet TAKE 1 TABLET BY MOUTH DAILY   No facility-administered encounter medications on file as of 02/21/2020.    Allergies (verified) Codeine and Lisinopril   History: Past Medical History:  Diagnosis Date  . Allergy   . Arthritis    right knee  . Diabetes [place], type 2 [place]   . Hypertension   . Urinary incontinence   . Wears dentures    full upper and lower   Past Surgical History:  Procedure Laterality Date  . APPENDECTOMY    . CATARACT EXTRACTION W/PHACO Left 08/08/2019   Procedure: CATARACT EXTRACTION PHACO AND INTRAOCULAR LENS PLACEMENT (IOC) LEFT 4.59  00:32.3;  Surgeon: KEulogio Bear MD;  [facility]  Service: Ophthalmology;  Laterality: Left;  Diabetic - oral meds  . CATARACT EXTRACTION W/PHACO Right 09/05/2019   Procedure: CATARACT EXTRACTION PHACO AND INTRAOCULAR LENS PLACEMENT (IOC) RIGHT DIABETIC 4.65  00:34.2;  Surgeon: Eulogio Bear, MD;  Location: [place];  Service: Ophthalmology;  Laterality: Right;  Diabetic - oral meds  . CHOLECYSTECTOMY  1993  . TONSILLECTOMY     Family History    Problem Relation Age of Onset  . Heart failure Mother   . Heart disease Mother   . Diabetes Mother   . Heart attack Mother   . Cancer Father        lung  . Heart attack Sister   . Diabetes Sister   . Breast cancer Half-Sister    Social History   Socioeconomic History  . Marital status: Widowed    Spouse name: Not on file  . Number of children: Not on file  . Years of education: Not on file  . Highest education level: Some college, no degree  Occupational History  . Occupation: retired  Tobacco Use  . Smoking status: Never Smoker  . Smokeless tobacco: Never Used  Vaping Use  . Vaping Use: Never used  Substance and Sexual Activity  . Alcohol use: No  . Drug use: No  . Sexual activity: Not on file  Other Topics Concern  . Not on file  Social History Narrative  . Not on file   Social Determinants of Health   Financial Resource Strain: Low Risk   . Difficulty of Paying Living Expenses: Not hard at all  Food Insecurity: No Food Insecurity  . Worried About Charity fundraiser in the Last Year: Never true  . Ran Out of Food in the Last Year: Never true  Transportation Needs: No Transportation Needs  . Lack of Transportation (Medical): No  . Lack of Transportation (Non-Medical): No  Physical Activity: Inactive  . Days of Exercise per Week: 0 days  . Minutes of Exercise per Session: 0 min  Stress: No Stress Concern Present  . Feeling of Stress : Not at all  Social Connections: Socially Isolated  . Frequency of Communication with Friends and Family: More than three times a week  . Frequency of Social Gatherings with Friends and Family: More than three times a week  . Attends Religious Services: Never  . Active Member of Clubs or Organizations: No  . Attends Archivist Meetings: Never  . Marital Status: Widowed    Tobacco Counseling Counseling given: Not Answered   Clinical Intake:  Pre-visit preparation completed: Yes  Pain : No/denies pain      Nutritional Status: BMI 25 -29 Overweight Nutritional Risks: None Diabetes: Yes  How often do you need to have someone help you when you read instructions, pamphlets, or other written materials from your doctor or pharmacy?: 1 - Never What is the last grade level you completed in school?: 12th grade  Diabetic? Yes Nutrition Risk Assessment:  Has the patient had any N/V/D within the last 2 months?  No  Does the patient have any non-healing wounds?  No  Has the patient had any unintentional weight loss or weight gain?  No   Diabetes:  Is the patient diabetic?  Yes  If diabetic, was a CBG obtained today?  No  Did the patient bring in their glucometer from home?  No  How often do you monitor your CBG's? Twice weekly.   Financial Strains and Diabetes Management:  Are you having any financial strains with the device,  your supplies or your medication? No .  Does the patient want to be seen by Chronic Care Management for management of their diabetes?  No  Would the patient like to be referred to a Nutritionist or for Diabetic Management?  No   Diabetic Exams:  Diabetic Eye Exam: Completed 06/15/2019 Diabetic Foot Exam: Completed 01/31/2013   Interpreter Needed?: No  Information entered by :: NAllen LPN   Activities of Daily Living In your present state of health, do you have any difficulty performing the following activities: 02/21/2020 09/05/2019  Hearing? N N  Vision? N N  Difficulty concentrating or making decisions? N N  Walking or climbing stairs? N N  Dressing or bathing? N N  Doing errands, shopping? N -  Preparing Food and eating ? N -  Using the Toilet? N -  In the past six months, have you accidently leaked urine? Y -  Do you have problems with loss of bowel control? N -  Managing your Medications? N -  Managing your Finances? N -  Housekeeping or managing your Housekeeping? N -  Some recent data might be hidden    Patient Care Team: Olin Hauser, DO as PCP - General (Family Medicine) Vanita Ingles, RN as Case Manager (General Practice)  Indicate any recent Medical Services you may have received from other than [facility] providers in the past year (date may be approximate).     Assessment:   This is a routine wellness examination for [place].  Hearing/Vision screen  Hearing Screening   '125Hz'  '250Hz'  '500Hz'  '1000Hz'  '2000Hz'  '3000Hz'  '4000Hz'  '6000Hz'  '8000Hz'   Right ear:           Left ear:           Vision Screening Comments: Regular eye exams, Dr. Edison Pace, [facility]  Dietary issues and exercise activities discussed: Current Exercise Habits: The patient does not participate in regular exercise at present  Goals    .  DIET - INCREASE WATER INTAKE      Recommend drinking at least 6-8 glasses of water a day     .  Patient Stated      02/21/2020, no goals    .  RNCM: Pt: "I haven't checked my blood pressure lately" (pt-stated)      CARE PLAN ENTRY (see longtitudinal plan of care for additional care plan information)  Current Barriers:  . Chronic Disease Management support, education, and care coordination needs related to HTN, HLD, and DMII  Clinical Goal(s) related to HTN, HLD, and DMII:  Over the next 120 days, patient will:  . Work with the care management team to address educational, disease management, and care coordination needs  . Begin or continue self health monitoring activities as directed today Measure and record cbg (blood glucose) 1 times daily, Measure and record blood pressure 2 times per week, and adhere to a heart healthy/ADA diet . Call provider office for new or worsened signs and symptoms Blood glucose findings outside established parameters, Blood pressure findings outside established parameters, and New or worsened symptom related to HLD and other chronic conditions . Call care management team with questions or concerns . Verbalize basic understanding of patient centered plan of care established  today  Interventions related to HTN, HLD, and DMII:  . Evaluation of current treatment plans and patient's adherence to plan as established by provider.  The patient is doing well. Denies any new concerns. Feels like she is managing her health well. 01-30-2020: The patient  states that her hemoglobin A1C is up to 7.6 and it is because she has not been good. The patient states with her recent cataract surgery she was off with her eating but wants to get back on track.   . Assessed patient understanding of disease states.  Has a good understanding of her chronic conditions.  . Assessed patient's education and care coordination needs.  The patient denies any care coordination needs. Review of heart healthy/ADA Diet.  01-30-2020: The patient states that she has not been "good" with her eating. The patient states that she is going to do better. Her blood sugars have been 160-175 and she knows she has got to do better with diabetes control.  . Review of upcoming appointments: appointment with pcp on 02-01-2020.  Marland Kitchen Evaluation of cataract surgery. The patient states she is doing very well and can actually see now. Is planning on getting new lens for her glasses for reading. Does not need glasses when she is not reading. 01-30-2020: Doing well post cataract surgery bilateral eyes. The patient states she is done very well.   . Provided disease specific education to patient: review of heart healthy/ADA diet.  The patient admits she does not always adhere to a heart healthy/ADA diet. She does try to eat a balanced diet. She lives with her son and her sister and they take turns cooking. She admits she doesn't always "eat right" but she is trying to be more mindful of what she is eating.  Will send information by my chart and EMMI for review on heart healthy/ADA diet. 01-30-2020: Review of heart healthy/ADA diet and healthy food choices.  . Review of blood sugar readings. The patient states fasting her blood sugars are  around 130 and consistently stay that range. She does not usually check during the day or at night. Denies any episodes of hyperglycemia or hypoglycemia. 01-30-2020: The patient states that her blood sugars are 160-175 and most recent Hemoglobin A1C was 7.6. The patient verbalized that she had not been good. Education on ideal Hemogloving A1C for diabetes less than 7.0 and fasting blood sugar <130 and post prandial <180.  The patient states that she is working on getting her blood sugars under better control. Education and support given.  . Review of blood pressure readings. The patient takes her blood pressure regularly. States that last reading was 128/68.  She states she has good blood pressure control. 01-30-2020: The patient states that her blood pressures are WNL. Denies any issues with blood pressure control.  . Evaluation of activity level. The patient mows her yard weekly. She has a riding mower. It usually takes her about 3 hours to mow. Sometimes she splits it up into 2 days. The patient states that she doesn't mow when it is hot outside and denies any concerns with safety.  Nash Dimmer with appropriate clinical care team members regarding patient needs- has no needs at this time but knows pharmacist and LCSW are available to help if needed.   Patient Self Care Activities related to HTN, HLD, and DMII:  . Patient is unable to independently self-manage chronic health conditions  Please see past updates related to this goal by clicking on the "Past Updates" button in the selected goal        Depression Screen PHQ 2/9 Scores 02/21/2020 02/01/2020 07/25/2019 07/21/2019 02/15/2019 02/11/2019 08/12/2018  PHQ - 2 Score 0 0 0 0 0 0 0  PHQ- 9 Score 0 - - - - - -  Fall Risk Fall Risk  02/21/2020 02/01/2020 07/25/2019 02/15/2019 02/11/2019  Falls in the past year? 0 0 0 0 0  Number falls in past yr: - 0 0 0 -  Injury with Fall? - 0 0 0 -  Risk for fall due to : Medication side effect - - - -   Follow up Falls evaluation completed;Education provided;Falls prevention discussed Falls evaluation completed Falls evaluation completed - Falls evaluation completed    Any stairs in or around the home? Yes  If so, are there any without handrails? No  Home free of loose throw rugs in walkways, pet beds, electrical cords, etc? Yes  Adequate lighting in your home to reduce risk of falls? Yes   ASSISTIVE DEVICES UTILIZED TO PREVENT FALLS:  Life alert? No  Use of a cane, walker or w/c? No  Grab bars in the bathroom? No  Shower chair or bench in shower? Yes built in Elevated toilet seat or a handicapped toilet? No   TIMED UP AND GO:  Was the test performed? No .    Cognitive Function:     6CIT Screen 02/21/2020 02/09/2018  What Year? 0 points 0 points  What month? 0 points 0 points  What time? 0 points 0 points  Count back from 20 0 points 0 points  Months in reverse 0 points 0 points  Repeat phrase 0 points 0 points  Total Score 0 0    Immunizations Immunization History  Administered Date(s) Administered  . Fluad Quad(high Dose 65+) 02/01/2019, 02/01/2020  . Influenza, High Dose Seasonal PF 12/30/2016, 02/09/2018  . Moderna SARS-COVID-2 Vaccination 09/19/2019, 10/17/2019  . Pneumococcal Conjugate-13 07/11/2016  . Pneumococcal Polysaccharide-23 08/10/2017  . Tdap 07/11/2016    TDAP status: Up to date Flu Vaccine status: Up to date Pneumococcal vaccine status: Up to date Covid-19 vaccine status: Completed vaccines  Qualifies for Shingles Vaccine? Yes   Zostavax completed No   Shingrix Completed?: No.    Education has been provided regarding the importance of this vaccine. Patient has been advised to call insurance company to determine out of pocket expense if they have not yet received this vaccine. Advised may also receive vaccine at local pharmacy or Health Dept. Verbalized acceptance and understanding.  Screening Tests Health Maintenance  Topic Date Due  . Fecal  DNA (Cologuard)  07/22/2019  . OPHTHALMOLOGY EXAM  06/14/2020  . HEMOGLOBIN A1C  07/24/2020  . FOOT EXAM  01/31/2021  . MAMMOGRAM  10/05/2021  . TETANUS/TDAP  07/12/2026  . INFLUENZA VACCINE  Completed  . DEXA SCAN  Completed  . COVID-19 Vaccine  Completed  . Hepatitis C Screening  Completed  . PNA vac Low Risk Adult  Completed    Health Maintenance  Health Maintenance Due  Topic Date Due  . Fecal DNA (Cologuard)  07/22/2019    Colorectal cancer screening: Completed 01/2020. Repeat every 3 years just mailed out Mammogram status: Completed 10/06/2019. Repeat every year Bone Density status: Completed 04/21/2006  Lung Cancer Screening: (Low Dose CT Chest recommended if Age 50-80 years, 30 pack-year currently smoking OR have quit w/in 15years.) does not qualify.   Lung Cancer Screening Referral: no  Additional Screening:  Hepatitis C Screening: does qualify; Completed 07/15/2016  Vision Screening: Recommended annual ophthalmology exams for early detection of glaucoma and other disorders of the eye. Is the patient up to date with their annual eye exam?  Yes  Who is the provider or what is the name of the office in which the patient  attends annual eye exams? [facility] If pt is not established with a provider, would they like to be referred to a provider to establish care? No .   Dental Screening: Recommended annual dental exams for proper oral hygiene  Community Resource Referral / Chronic Care Management: CRR required this visit?  No   CCM required this visit?  No      Plan:     I have personally reviewed and noted the following in the patient's chart:   . Medical and social history . Use of alcohol, tobacco or illicit drugs  . Current medications and supplements . Functional ability and status . Nutritional status . Physical activity . Advanced directives . List of other physicians . Hospitalizations, surgeries, and ER visits in previous 12  months . Vitals . Screenings to include cognitive, depression, and falls . Referrals and appointments  In addition, I have reviewed and discussed with patient certain preventive protocols, quality metrics, and best practice recommendations. A written personalized care plan for preventive services as well as general preventive health recommendations were provided to patient.     Kellie Simmering, LPN   11/0/0349   Nurse Notes:

## 2020-02-24 LAB — COLOGUARD: COLOGUARD: NEGATIVE

## 2020-04-09 ENCOUNTER — Ambulatory Visit: Payer: Self-pay | Admitting: General Practice

## 2020-04-09 ENCOUNTER — Telehealth: Payer: Self-pay | Admitting: General Practice

## 2020-04-09 DIAGNOSIS — E785 Hyperlipidemia, unspecified: Secondary | ICD-10-CM

## 2020-04-09 DIAGNOSIS — E1169 Type 2 diabetes mellitus with other specified complication: Secondary | ICD-10-CM

## 2020-04-09 DIAGNOSIS — I10 Essential (primary) hypertension: Secondary | ICD-10-CM

## 2020-04-09 NOTE — Chronic Care Management (AMB) (Signed)
Chronic Care Management   Follow Up Note   04/09/2020 Name: Andrea Dodson MRN: [id number] DOB: Oct 28, 1950  Referred by: Olin Hauser, DO Reason for referral : Chronic Care Management (RNCM: Follow up call for Chronic Disease Management and Care Coordination Needs)   Andrea Dodson is a 69 y.o. year old female who is a primary care patient of Olin Hauser, DO. The CCM team was consulted for assistance with chronic disease management and care coordination needs.    Review of patient status, including review of consultants reports, relevant laboratory and other test results, and collaboration with appropriate care team members and the patient's provider was performed as part of comprehensive patient evaluation and provision of chronic care management services.    SDOH (Social Determinants of Health) assessments performed: Yes See Care Plan activities for detailed interventions related to [facility])     Outpatient Encounter Medications as of 04/09/2020  Medication Sig  . aspirin EC 81 MG tablet Take 1 tablet (81 mg total) by mouth daily.  . Blood Glucose Monitoring Suppl (ONE TOUCH ULTRA 2) w/Device KIT 1 device for checking blood sugar once daily  . fluticasone (FLONASE) 50 MCG/ACT nasal spray Place 2 sprays into both nostrils daily.  Marland Kitchen glucose blood (ONE TOUCH ULTRA TEST) test strip CHECK BLOOD SUGAR ONCE DAILY  . Krill Oil 350 MG CAPS Take 350 mg by mouth daily.  . Lancets (ONETOUCH ULTRASOFT) lancets Use as instructed  . loratadine (CLARITIN) 10 MG tablet Take 1 tablet (10 mg total) by mouth daily.  Marland Kitchen losartan (COZAAR) 50 MG tablet TAKE 1 TABLET BY MOUTH  DAILY  . metFORMIN (GLUCOPHAGE) 500 MG tablet TAKE 1 TABLET BY MOUTH  TWICE DAILY WITH A MEAL (Patient taking differently: Take 1,000 mg by mouth 2 (two) times daily. )  . rosuvastatin (CRESTOR) 10 MG tablet TAKE 1 TABLET BY MOUTH DAILY   No facility-administered encounter medications on file as of 04/09/2020.      Objective:   Goals Addressed              This Visit's Progress   .  RNCM: Pt: "I haven't checked my blood pressure lately" (pt-stated)        CARE PLAN ENTRY (see longtitudinal plan of care for additional care plan information)  Current Barriers:  . Chronic Disease Management support, education, and care coordination needs related to HTN, HLD, and DMII  Clinical Goal(s) related to HTN, HLD, and DMII:  Over the next 120 days, patient will:  . Work with the care management team to address educational, disease management, and care coordination needs  . Begin or continue self health monitoring activities as directed today Measure and record cbg (blood glucose) 1 times daily, Measure and record blood pressure 2 times per week, and adhere to a heart healthy/ADA diet . Call provider office for new or worsened signs and symptoms Blood glucose findings outside established parameters, Blood pressure findings outside established parameters, and New or worsened symptom related to HLD and other chronic conditions . Call care management team with questions or concerns . Verbalize basic understanding of patient centered plan of care established today  Interventions related to HTN, HLD, and DMII:  . Evaluation of current treatment plans and patient's adherence to plan as established by provider.  The patient is doing well. Denies any new concerns. Feels like she is managing her health well. 04-09-2020: The patient states she is doing well with her health and well being. She saw the pcp on  02-01-2020 and got a good report. Her hemoglobin A1C was 7.6 and up slightly from previous. She says sometimes she does not eat like she should.  . Lab Results .  Component . Value . Date .   Marland Kitchen HGBA1C . 7.6 (H) . 01/24/2020    . Assessed patient understanding of disease states.  Has a good understanding of her chronic conditions. 04-09-2020: The patient states that she understands her chronic conditions.  Denies any issues with following recommendations by the pcp and other providers.  . Assessed patient's education and care coordination needs.  The patient denies any care coordination needs. Review of heart healthy/ADA Diet.  04-09-2020: The patient states that she has not been "good" with her eating. The patient states that she is going to do better. Her blood sugars have been 160-175 and she knows she has got to do better with diabetes control. The patient states with her cataract surgery and the holidays she has gotten off lately. Her appetite is good and she knows she has to monitor her blood sugar intake better.  . Review of upcoming appointments: appointment with pcp on 08-03-2020. Marland Kitchen Evaluation of cataract surgery. The patient states she is doing very well and can actually see now. Is planning on getting new lens for her glasses for reading. Does not need glasses when she is not reading. 04-09-2020: Doing well post cataract surgery bilateral eyes. The patient states she is done very well.   . Provided disease specific education to patient: review of heart healthy/ADA diet.  The patient admits she does not always adhere to a heart healthy/ADA diet. She does try to eat a balanced diet. She lives with her son and her sister and they take turns cooking. She admits she doesn't always "eat right" but she is trying to be more mindful of what she is eating.  Will send information by my chart and EMMI for review on heart healthy/ADA diet. 04-09-2020: Review of heart healthy/ADA diet and healthy food choices.  . Review of blood sugar readings. The patient states fasting her blood sugars are around 130 and consistently stay that range. She does not usually check during the day or at night. Denies any episodes of hyperglycemia or hypoglycemia. 04-09-2020: The patient states that her blood sugars are 160-175 and most recent Hemoglobin A1C was 7.6. The patient verbalized that she had not been good. Education on ideal  Hemogloving A1C for diabetes less than 7.0 and fasting blood sugar <130 and post prandial <180.  The patient states that she is working on getting her blood sugars under better control. Education and support given.  . Review of blood pressure readings. The patient takes her blood pressure regularly. States that last reading was 128/68.  She states she has good blood pressure control. 04-09-2020: The patient states that her blood pressures are WNL. Denies any issues with blood pressure control.  . Evaluation of activity level. The patient mows her yard weekly. She has a riding mower. It usually takes her about 3 hours to mow. Sometimes she splits it up into 2 days. The patient states that she doesn't mow when it is hot outside and denies any concerns with safety.  Nash Dimmer with appropriate clinical care team members regarding patient needs- has no needs at this time but knows pharmacist and LCSW are available to help if needed.   Patient Self Care Activities related to HTN, HLD, and DMII:  . Patient is unable to independently self-manage chronic health conditions  Please see past updates related to this goal by clicking on the "Past Updates" button in the selected goal           Plan:   Telephone follow up appointment with care management team member scheduled for: 06-11-2020 at 0900 am   [place], MSN, [place] Mobile: [phone]

## 2020-04-09 NOTE — Patient Instructions (Signed)
Visit Information  Goals Addressed              This Visit's Progress     RNCM: Pt: "I haven't checked my blood pressure lately" (pt-stated)        CARE PLAN ENTRY (see longtitudinal plan of care for additional care plan information)  Current Barriers:   Chronic Disease Management support, education, and care coordination needs related to HTN, HLD, and DMII  Clinical Goal(s) related to HTN, HLD, and DMII:  Over the next 120 days, patient will:   Work with the care management team to address educational, disease management, and care coordination needs   Begin or continue self health monitoring activities as directed today Measure and record cbg (blood glucose) 1 times daily, Measure and record blood pressure 2 times per week, and adhere to a heart healthy/ADA diet  Call provider office for new or worsened signs and symptoms Blood glucose findings outside established parameters, Blood pressure findings outside established parameters, and New or worsened symptom related to HLD and other chronic conditions  Call care management team with questions or concerns  Verbalize basic understanding of patient centered plan of care established today  Interventions related to HTN, HLD, and DMII:   Evaluation of current treatment plans and patient's adherence to plan as established by provider.  The patient is doing well. Denies any new concerns. Feels like she is managing her health well. 04-09-2020: The patient states she is doing well with her health and well being. She saw the pcp on 02-01-2020 and got a good report. Her hemoglobin A1C was 7.6 and up slightly from previous. She says sometimes she does not eat like she should.   Lab Results   Component  Value  Date     HGBA1C  7.6 (H)  01/24/2020     Assessed patient understanding of disease states.  Has a good understanding of her chronic conditions. 04-09-2020: The patient states that she understands her chronic conditions. Denies  any issues with following recommendations by the pcp and other providers.   Assessed patient's education and care coordination needs.  The patient denies any care coordination needs. Review of heart healthy/ADA Diet.  04-09-2020: The patient states that she has not been "good" with her eating. The patient states that she is going to do better. Her blood sugars have been 160-175 and she knows she has got to do better with diabetes control. The patient states with her cataract surgery and the holidays she has gotten off lately. Her appetite is good and she knows she has to monitor her blood sugar intake better.   Review of upcoming appointments: appointment with pcp on 08-03-2020.  Evaluation of cataract surgery. The patient states she is doing very well and can actually see now. Is planning on getting new lens for her glasses for reading. Does not need glasses when she is not reading. 04-09-2020: Doing well post cataract surgery bilateral eyes. The patient states she is done very well.    Provided disease specific education to patient: review of heart healthy/ADA diet.  The patient admits she does not always adhere to a heart healthy/ADA diet. She does try to eat a balanced diet. She lives with her son and her sister and they take turns cooking. She admits she doesn't always "eat right" but she is trying to be more mindful of what she is eating.  Will send information by my chart and EMMI for review on heart healthy/ADA diet. 04-09-2020: Review of heart  healthy/ADA diet and healthy food choices.   Review of blood sugar readings. The patient states fasting her blood sugars are around 130 and consistently stay that range. She does not usually check during the day or at night. Denies any episodes of hyperglycemia or hypoglycemia. 04-09-2020: The patient states that her blood sugars are 160-175 and most recent Hemoglobin A1C was 7.6. The patient verbalized that she had not been good. Education on ideal  Hemogloving A1C for diabetes less than 7.0 and fasting blood sugar <130 and post prandial <180.  The patient states that she is working on getting her blood sugars under better control. Education and support given.   Review of blood pressure readings. The patient takes her blood pressure regularly. States that last reading was 128/68.  She states she has good blood pressure control. 04-09-2020: The patient states that her blood pressures are WNL. Denies any issues with blood pressure control.   Evaluation of activity level. The patient mows her yard weekly. She has a riding mower. It usually takes her about 3 hours to mow. Sometimes she splits it up into 2 days. The patient states that she doesn't mow when it is hot outside and denies any concerns with safety.   Collaborated with appropriate clinical care team members regarding patient needs- has no needs at this time but knows pharmacist and LCSW are available to help if needed.   Patient Self Care Activities related to HTN, HLD, and DMII:   Patient is unable to independently self-manage chronic health conditions  Please see past updates related to this goal by clicking on the "Past Updates" button in the selected goal         The patient verbalized understanding of instructions, educational materials, and care plan provided today and declined offer to receive copy of patient instructions, educational materials, and care plan.   Telephone follow up appointment with care management team member scheduled for: 06-11-2020 at 0900 am  Noreene Larsson RN, MSN, [place] Mobile: [phone]

## 2020-06-11 ENCOUNTER — Telehealth: Payer: Self-pay | Admitting: General Practice

## 2020-06-11 ENCOUNTER — Ambulatory Visit (INDEPENDENT_AMBULATORY_CARE_PROVIDER_SITE_OTHER): Payer: Medicare Other | Admitting: General Practice

## 2020-06-11 DIAGNOSIS — E785 Hyperlipidemia, unspecified: Secondary | ICD-10-CM | POA: Diagnosis not present

## 2020-06-11 DIAGNOSIS — I10 Essential (primary) hypertension: Secondary | ICD-10-CM | POA: Diagnosis not present

## 2020-06-11 DIAGNOSIS — E1169 Type 2 diabetes mellitus with other specified complication: Secondary | ICD-10-CM

## 2020-06-11 NOTE — Chronic Care Management (AMB) (Signed)
Chronic Care Management   CCM RN Visit Note  06/11/2020 Name: Andrea Dodson MRN: [id number] DOB: 1950/10/29  Subjective: Andrea Dodson is a 70 y.o. year old female who is a primary care patient of Olin Hauser, DO. The care management team was consulted for assistance with disease management and care coordination needs.    Engaged with patient by telephone for follow up visit in response to provider referral for case management and/or care coordination services.   Consent to Services:  The patient was given information about Chronic Care Management services, agreed to services, and gave verbal consent prior to initiation of services.  Please see initial visit note for detailed documentation.   Patient agreed to services and verbal consent obtained.   Assessment: Review of patient past medical history, allergies, medications, health status, including review of consultants reports, laboratory and other test data, was performed as part of comprehensive evaluation and provision of chronic care management services.   SDOH (Social Determinants of Health) assessments and interventions performed:    CCM Care Plan  Allergies  Allergen Reactions  . Codeine Nausea And Vomiting and Other (See Comments)    Nausea vomiting,  Muscle weakness  . Lisinopril Cough    ACEi-cough    Outpatient Encounter Medications as of 06/11/2020  Medication Sig  . aspirin EC 81 MG tablet Take 1 tablet (81 mg total) by mouth daily.  . Blood Glucose Monitoring Suppl (ONE TOUCH ULTRA 2) w/Device KIT 1 device for checking blood sugar once daily  . fluticasone (FLONASE) 50 MCG/ACT nasal spray Place 2 sprays into both nostrils daily.  Marland Kitchen glucose blood (ONE TOUCH ULTRA TEST) test strip CHECK BLOOD SUGAR ONCE DAILY  . Krill Oil 350 MG CAPS Take 350 mg by mouth daily.  . Lancets (ONETOUCH ULTRASOFT) lancets Use as instructed  . loratadine (CLARITIN) 10 MG tablet Take 1 tablet (10 mg total) by mouth daily.   Marland Kitchen losartan (COZAAR) 50 MG tablet TAKE 1 TABLET BY MOUTH  DAILY  . metFORMIN (GLUCOPHAGE) 500 MG tablet TAKE 1 TABLET BY MOUTH  TWICE DAILY WITH A MEAL (Patient taking differently: Take 1,000 mg by mouth 2 (two) times daily. )  . rosuvastatin (CRESTOR) 10 MG tablet TAKE 1 TABLET BY MOUTH DAILY   No facility-administered encounter medications on file as of 06/11/2020.    Patient Active Problem List   Diagnosis Date Noted  . Salivary gland swelling 02/11/2019  . Environmental and seasonal allergies 08/12/2018  . Hyperlipidemia associated with type 2 diabetes mellitus ([place]) 01/20/2017  . Overweight (BMI 25.0-29.9) 07/11/2016  . Type 2 diabetes mellitus with other specified complication ([place]) 33/54/5625  . Essential hypertension 07/11/2016  . Colon cancer screening 07/11/2016  . H/O seasonal allergies 07/08/2016  . Osteoarthritis of right knee 07/08/2016  . Urinary incontinence 07/08/2016    Conditions to be addressed/monitored:HTN, HLD and DMII  Care Plan : RNCM: HLD  Updates made by Vanita Ingles since 06/11/2020 12:00 AM    Problem: RNCM: HLD Health Promotion or Disease Self-Management (General Plan of Care)   Priority: Medium    Long-Range Goal: RNCM: Management of HLD   This Visit's Progress: On track  Priority: Medium  Note:   Current Barriers:  . Poorly controlled hyperlipidemia, complicated by eating choices at times, elevated hemoglobin A1C of 7.6 on 02-01-2020 . Current antihyperlipidemic regimen: Rosuvastatin 10 mg QD . Most recent lipid panel:     Component Value Date/Time   CHOL 123 01/24/2020 0951   CHOL 202 (H)  07/15/2016 1106   TRIG 165 (H) 01/24/2020 0951   HDL 51 01/24/2020 0951   HDL 50 07/15/2016 1106   CHOLHDL 2.4 01/24/2020 0951   LDLCALC 48 01/24/2020 0951 .   Marland Kitchen ASCVD risk enhancing conditions: age >20, DM, HTN . Unable to independently manage HLD . Does not contact provider office for questions/concerns  RN Care Manager Clinical Goal(s):  Marland Kitchen Over  the next 120 days, patient will work with Consulting civil engineer, providers, and care team towards execution of optimized self-health management plan . patient will verbalize understanding of plan for effective management of HLD . patient will work with [facility] and pcp  to address needs related to HLD  . patient will attend all scheduled medical appointments: 08-03-2020 at 1:20 pm  Interventions: . Collaboration with Olin Hauser, DO regarding development and update of comprehensive plan of care as evidenced by provider attestation and co-signature . Inter-disciplinary care team collaboration (see longitudinal plan of care) . Medication review performed; medication list updated in electronic medical record.  Bertram Savin care team collaboration (see longitudinal plan of care) . Referred to pharmacy team for assistance with HLD medication management . Evaluation of current treatment plan related to HLD and patient's adherence to plan as established by provider. . Advised patient to call the office for changes in conditions or questions  . Provided education to patient re: Heart Healthy/ADA diet, rest, and monitoring for changes in heatlh . Reviewed scheduled/upcoming provider appointments including: 08-03-2020 at 1:20 pm . Discussed plans with patient for ongoing care management follow up and provided patient with direct contact information for care management team   Patient Goals/Self-Care Activities: . Over the next 120 days, patient will:   - call for medicine refill 2 or 3 days before it runs out - call if I am sick and can't take my medicine - keep a list of all the medicines I take; vitamins and herbals too - learn to read medicine labels - use a pillbox to sort medicine - use an alarm clock or phone to remind me to take my medicine - change to whole grain breads, cereal, pasta - drink 6 to 8 glasses of water each day - eat 5 or 6 small meals each day - fill half the plate  with nonstarchy vegetables - limit fast food meals to no more than 1 per week - manage portion size - prepare main meal at home 3 to 5 days each week - read food labels for fat, fiber, carbohydrates and portion size - be open to making changes - I can manage, know and watch for signs of a heart attack - if I have chest pain, call for help - learn about small changes that will make a big difference - learn my personal risk factors  - barriers to meeting goals identified - change-talk evoked - choices provided - collaboration with team encouraged - decision-making supported - health risks reviewed - problem-solving facilitated - questions answered - readiness for change evaluated - reassurance provided - self-reflection promoted - self-reliance encouraged  Follow Up Plan: Telephone follow up appointment with care management team member scheduled for: 08-06-2020 at 0900 am     Task: RNCM: Goals for HLD   Note:   Care Management Activities:    - barriers to meeting goals identified - change-talk evoked - choices provided - collaboration with team encouraged - decision-making supported - health risks reviewed - problem-solving facilitated - questions answered - readiness for change evaluated - reassurance  provided - self-reflection promoted - self-reliance encouraged       Care Plan : RNCM: Hypertension (Adult)  Updates made by Vanita Ingles since 06/11/2020 12:00 AM    Problem: RNCM: Hypertension (Hypertension)   Priority: Medium    Long-Range Goal: RNCM: Hypertension Monitored   Priority: Medium  Note:   Objective:  . Last practice recorded BP readings:  BP Readings from Last 3 Encounters:  02/01/20 (!) 127/55  09/05/19 129/66  08/08/19 (!) 146/64 .   Marland Kitchen Most recent eGFR/CrCl: No results found for: EGFR  No components found for: CRCL Current Barriers:  Marland Kitchen Knowledge Deficits related to basic understanding of hypertension pathophysiology and self care  management . Knowledge Deficits related to understanding of medications prescribed for management of hypertension . Does not contact provider office for questions/concerns Case Manager Clinical Goal(s):  Marland Kitchen Over the next 120 days, patient will verbalize understanding of plan for hypertension management . Over the next 120 days, patient will attend all scheduled medical appointments: 08-03-2020 at 1:20 pm . Over the next 120 days, patient will demonstrate improved adherence to prescribed treatment plan for hypertension as evidenced by taking all medications as prescribed, monitoring and recording blood pressure as directed, adhering to low sodium/DASH diet . Over the next 120 days, patient will demonstrate improved health management independence as evidenced by checking blood pressure as directed and notifying PCP if SBP>160 or DBP > 90, taking all medications as prescribe, and adhering to a low sodium diet as discussed. . Over the next 120 days, patient will verbalize basic understanding of hypertension disease process and self health management plan as evidenced by normalized blood pressure, adherence to dietary restrictions, working with CCM team to optimize health and well being  Interventions:  . Collaboration with Olin Hauser, DO regarding development and update of comprehensive plan of care as evidenced by provider attestation and co-signature . Inter-disciplinary care team collaboration (see longitudinal plan of care) . Evaluation of current treatment plan related to hypertension self management and patient's adherence to plan as established by provider. . Provided education to patient re: stroke prevention, s/s of heart attack and stroke, DASH diet, complications of uncontrolled blood pressure . Reviewed medications with patient and discussed importance of compliance . Discussed plans with patient for ongoing care management follow up and provided patient with direct contact  information for care management team . Advised patient, providing education and rationale, to monitor blood pressure daily and record, calling PCP for findings outside established parameters.  . Reviewed scheduled/upcoming provider appointments including: 08-03-2020 at 1:20 pm Patient Goals/Self-Care Activities . Over the next 120 days, patient will:  - Self administers medications as prescribed Attends all scheduled provider appointments Calls provider office for new concerns, questions, or BP outside discussed parameters Checks BP and records as discussed Follows a low sodium diet/DASH diet - blood pressure trends reviewed - depression screen reviewed - home or ambulatory blood pressure monitoring encouraged Follow Up Plan: Telephone follow up appointment with care management team member scheduled for: 08-06-2020 at 0900 am   Task: RNCM: Identify and Monitor Blood Pressure Elevation   Note:   Care Management Activities:    - blood pressure trends reviewed - depression screen reviewed - home or ambulatory blood pressure monitoring encouraged       Care Plan : RNCM: Diabetes Type 2 (Adult)  Updates made by Vanita Ingles since 06/11/2020 12:00 AM    Problem: RNCM: Glycemic Management (Diabetes, Type 2)   Priority: Medium  Long-Range Goal: RNCM: Glycemic Management Optimized   Note:   Objective:  Lab Results  Component Value Date   HGBA1C 7.6 (H) 01/24/2020 .   Lab Results  Component Value Date   CREATININE 0.83 01/24/2020   CREATININE 1.25 (H) 03/30/2019   CREATININE 0.74 02/03/2019 .   Marland Kitchen No results found for: EGFR Current Barriers:  Marland Kitchen Knowledge Deficits related to basic Diabetes pathophysiology and self care/management . Knowledge Deficits related to medications used for management of diabetes . Does not contact provider office for questions/concerns Case Manager Clinical Goal(s):  . patient will demonstrate improved adherence to prescribed treatment plan for  diabetes self care/management as evidenced by: daily monitoring and recording of CBG  adherence to ADA/ carb modified diet exercise 4/5 days/week adherence to prescribed medication regimen contacting provider for new or worsened symptoms or questions Interventions:  . Collaboration with Olin Hauser, DO regarding development and update of comprehensive plan of care as evidenced by provider attestation and co-signature . Inter-disciplinary care team collaboration (see longitudinal plan of care) . Provided education to patient about basic DM disease process . Reviewed medications with patient and discussed importance of medication adherence . Discussed plans with patient for ongoing care management follow up and provided patient with direct contact information for care management team . Provided patient with written educational materials related to hypo and hyperglycemia and importance of correct treatment . Reviewed scheduled/upcoming provider appointments including: 08-03-2020 . Advised patient, providing education and rationale, to check cbg bid and record, calling pcp for findings outside established parameters.  The patient states her blood sugars have been around "150".  Education on fasting <130 and post prandial <180.  The patient states hers is sometimes greater than 150 but that is usually when she has eat "bad".  The patient states that she is trying to watch her diet better since the holidays are over.  . Review of patient status, including review of consultants reports, relevant laboratory and other test results, and medications completed. Patient Goals/Self-Care Activities - Self administers oral medications as prescribed Self administers insulin as prescribed Attends all scheduled provider appointments Checks blood sugars as prescribed and utilize hyper and hypoglycemia protocol as needed Adheres to prescribed ADA/carb modified - barriers to adherence to treatment plan  identified - blood glucose monitoring encouraged - blood glucose readings reviewed - individualized medical nutrition therapy provided - mutual A1C goal set or reviewed - self-awareness of signs/symptoms of hypo or hyperglycemia encouraged - use of blood glucose monitoring log promoted Follow Up Plan: Telephone follow up appointment with care management team member scheduled for: 08-06-2020 at 0900 am   Task: RNCM: Alleviate Barriers to Glycemic Management   Note:   Care Management Activities:    - barriers to adherence to treatment plan identified - blood glucose monitoring encouraged - blood glucose readings reviewed - individualized medical nutrition therapy provided - mutual A1C goal set or reviewed - self-awareness of signs/symptoms of hypo or hyperglycemia encouraged - use of blood glucose monitoring log promoted         Plan:Telephone follow up appointment with care management team member scheduled for:  08-06-2020 at 0900 am  Rudd, MSN, [place] Mobile: [phone]

## 2020-06-11 NOTE — Patient Instructions (Signed)
Visit Information  PATIENT GOALS: Patient Care Plan: RNCM: HLD    Problem Identified: RNCM: HLD Health Promotion or Disease Self-Management (General Plan of Care)   Priority: Medium    Long-Range Goal: RNCM: Management of HLD   This Visit's Progress: On track  Priority: Medium  Note:   Current Barriers:  . Poorly controlled hyperlipidemia, complicated by eating choices at times, elevated hemoglobin A1C of 7.6 on 02-01-2020 . Current antihyperlipidemic regimen: Rosuvastatin 10 mg QD . Most recent lipid panel:     Component Value Date/Time   CHOL 123 01/24/2020 0951   CHOL 202 (H) 07/15/2016 1106   TRIG 165 (H) 01/24/2020 0951   HDL 51 01/24/2020 0951   HDL 50 07/15/2016 1106   CHOLHDL 2.4 01/24/2020 0951   LDLCALC 48 01/24/2020 0951 .   Marland Kitchen ASCVD risk enhancing conditions: age >46, DM, HTN . Unable to independently manage HLD . Does not contact provider office for questions/concerns  RN Care Manager Clinical Goal(s):  Marland Kitchen Over the next 120 days, patient will work with Consulting civil engineer, providers, and care team towards execution of optimized self-health management plan . patient will verbalize understanding of plan for effective management of HLD . patient will work with [facility] and pcp  to address needs related to HLD  . patient will attend all scheduled medical appointments: 08-03-2020 at 1:20 pm  Interventions: . Collaboration with Olin Hauser, DO regarding development and update of comprehensive plan of care as evidenced by provider attestation and co-signature . Inter-disciplinary care team collaboration (see longitudinal plan of care) . Medication review performed; medication list updated in electronic medical record.  Bertram Savin care team collaboration (see longitudinal plan of care) . Referred to pharmacy team for assistance with HLD medication management . Evaluation of current treatment plan related to HLD and patient's adherence to plan as established  by provider. . Advised patient to call the office for changes in conditions or questions  . Provided education to patient re: Heart Healthy/ADA diet, rest, and monitoring for changes in heatlh . Reviewed scheduled/upcoming provider appointments including: 08-03-2020 at 1:20 pm . Discussed plans with patient for ongoing care management follow up and provided patient with direct contact information for care management team   Patient Goals/Self-Care Activities: . Over the next 120 days, patient will:   - call for medicine refill 2 or 3 days before it runs out - call if I am sick and can't take my medicine - keep a list of all the medicines I take; vitamins and herbals too - learn to read medicine labels - use a pillbox to sort medicine - use an alarm clock or phone to remind me to take my medicine - change to whole grain breads, cereal, pasta - drink 6 to 8 glasses of water each day - eat 5 or 6 small meals each day - fill half the plate with nonstarchy vegetables - limit fast food meals to no more than 1 per week - manage portion size - prepare main meal at home 3 to 5 days each week - read food labels for fat, fiber, carbohydrates and portion size - be open to making changes - I can manage, know and watch for signs of a heart attack - if I have chest pain, call for help - learn about small changes that will make a big difference - learn my personal risk factors  - barriers to meeting goals identified - change-talk evoked - choices provided - collaboration with team encouraged -  decision-making supported - health risks reviewed - problem-solving facilitated - questions answered - readiness for change evaluated - reassurance provided - self-reflection promoted - self-reliance encouraged  Follow Up Plan: Telephone follow up appointment with care management team member scheduled for: 08-06-2020 at 0900 am     Task: RNCM: Goals for HLD   Note:   Care Management Activities:     - barriers to meeting goals identified - change-talk evoked - choices provided - collaboration with team encouraged - decision-making supported - health risks reviewed - problem-solving facilitated - questions answered - readiness for change evaluated - reassurance provided - self-reflection promoted - self-reliance encouraged       Patient Care Plan: RNCM: Hypertension (Adult)    Problem Identified: RNCM: Hypertension (Hypertension)   Priority: Medium    Long-Range Goal: RNCM: Hypertension Monitored   Priority: Medium  Note:   Objective:  . Last practice recorded BP readings:  BP Readings from Last 3 Encounters:  02/01/20 (!) 127/55  09/05/19 129/66  08/08/19 (!) 146/64 .   Marland Kitchen Most recent eGFR/CrCl: No results found for: EGFR  No components found for: CRCL Current Barriers:  Marland Kitchen Knowledge Deficits related to basic understanding of hypertension pathophysiology and self care management . Knowledge Deficits related to understanding of medications prescribed for management of hypertension . Does not contact provider office for questions/concerns Case Manager Clinical Goal(s):  Marland Kitchen Over the next 120 days, patient will verbalize understanding of plan for hypertension management . Over the next 120 days, patient will attend all scheduled medical appointments: 08-03-2020 at 1:20 pm . Over the next 120 days, patient will demonstrate improved adherence to prescribed treatment plan for hypertension as evidenced by taking all medications as prescribed, monitoring and recording blood pressure as directed, adhering to low sodium/DASH diet . Over the next 120 days, patient will demonstrate improved health management independence as evidenced by checking blood pressure as directed and notifying PCP if SBP>160 or DBP > 90, taking all medications as prescribe, and adhering to a low sodium diet as discussed. . Over the next 120 days, patient will verbalize basic understanding of hypertension  disease process and self health management plan as evidenced by normalized blood pressure, adherence to dietary restrictions, working with CCM team to optimize health and well being  Interventions:  . Collaboration with Olin Hauser, DO regarding development and update of comprehensive plan of care as evidenced by provider attestation and co-signature . Inter-disciplinary care team collaboration (see longitudinal plan of care) . Evaluation of current treatment plan related to hypertension self management and patient's adherence to plan as established by provider. . Provided education to patient re: stroke prevention, s/s of heart attack and stroke, DASH diet, complications of uncontrolled blood pressure . Reviewed medications with patient and discussed importance of compliance . Discussed plans with patient for ongoing care management follow up and provided patient with direct contact information for care management team . Advised patient, providing education and rationale, to monitor blood pressure daily and record, calling PCP for findings outside established parameters.  . Reviewed scheduled/upcoming provider appointments including: 08-03-2020 at 1:20 pm Patient Goals/Self-Care Activities . Over the next 120 days, patient will:  - Self administers medications as prescribed Attends all scheduled provider appointments Calls provider office for new concerns, questions, or BP outside discussed parameters Checks BP and records as discussed Follows a low sodium diet/DASH diet - blood pressure trends reviewed - depression screen reviewed - home or ambulatory blood pressure monitoring encouraged Follow Up Plan: Telephone  follow up appointment with care management team member scheduled for: 08-06-2020 at 0900 am   Task: RNCM: Identify and Monitor Blood Pressure Elevation   Note:   Care Management Activities:    - blood pressure trends reviewed - depression screen reviewed - home or  ambulatory blood pressure monitoring encouraged       Patient Care Plan: RNCM: Diabetes Type 2 (Adult)    Problem Identified: RNCM: Glycemic Management (Diabetes, Type 2)   Priority: Medium    Long-Range Goal: RNCM: Glycemic Management Optimized   Note:   Objective:  Lab Results  Component Value Date   HGBA1C 7.6 (H) 01/24/2020 .   Lab Results  Component Value Date   CREATININE 0.83 01/24/2020   CREATININE 1.25 (H) 03/30/2019   CREATININE 0.74 02/03/2019 .   Marland Kitchen No results found for: EGFR Current Barriers:  Marland Kitchen Knowledge Deficits related to basic Diabetes pathophysiology and self care/management . Knowledge Deficits related to medications used for management of diabetes . Does not contact provider office for questions/concerns Case Manager Clinical Goal(s):  . patient will demonstrate improved adherence to prescribed treatment plan for diabetes self care/management as evidenced by: daily monitoring and recording of CBG  adherence to ADA/ carb modified diet exercise 4/5 days/week adherence to prescribed medication regimen contacting provider for new or worsened symptoms or questions Interventions:  . Collaboration with Olin Hauser, DO regarding development and update of comprehensive plan of care as evidenced by provider attestation and co-signature . Inter-disciplinary care team collaboration (see longitudinal plan of care) . Provided education to patient about basic DM disease process . Reviewed medications with patient and discussed importance of medication adherence . Discussed plans with patient for ongoing care management follow up and provided patient with direct contact information for care management team . Provided patient with written educational materials related to hypo and hyperglycemia and importance of correct treatment . Reviewed scheduled/upcoming provider appointments including: 08-03-2020 . Advised patient, providing education and rationale, to check  cbg bid and record, calling pcp for findings outside established parameters.  The patient states her blood sugars have been around "150".  Education on fasting <130 and post prandial <180.  The patient states hers is sometimes greater than 150 but that is usually when she has eat "bad".  The patient states that she is trying to watch her diet better since the holidays are over.  . Review of patient status, including review of consultants reports, relevant laboratory and other test results, and medications completed. Patient Goals/Self-Care Activities - Self administers oral medications as prescribed Self administers insulin as prescribed Attends all scheduled provider appointments Checks blood sugars as prescribed and utilize hyper and hypoglycemia protocol as needed Adheres to prescribed ADA/carb modified - barriers to adherence to treatment plan identified - blood glucose monitoring encouraged - blood glucose readings reviewed - individualized medical nutrition therapy provided - mutual A1C goal set or reviewed - self-awareness of signs/symptoms of hypo or hyperglycemia encouraged - use of blood glucose monitoring log promoted Follow Up Plan: Telephone follow up appointment with care management team member scheduled for: 08-06-2020 at 0900 am   Task: RNCM: Alleviate Barriers to Glycemic Management   Note:   Care Management Activities:    - barriers to adherence to treatment plan identified - blood glucose monitoring encouraged - blood glucose readings reviewed - individualized medical nutrition therapy provided - mutual A1C goal set or reviewed - self-awareness of signs/symptoms of hypo or hyperglycemia encouraged - use of blood glucose monitoring log  promoted         Patient verbalizes understanding of instructions provided today and agrees to view in [place].   Telephone follow up appointment with care management team member scheduled for: 08-06-2020 at 0900 am  Noreene Larsson RN,  MSN, [facility] [place]: [phone]  Diabetes Mellitus and Nutrition, Adult When you have diabetes, or diabetes mellitus, it is very important to have healthy eating habits because your blood sugar (glucose) levels are greatly affected by what you eat and drink. Eating healthy foods in the right amounts, at about the same times every day, can help you:  Control your blood glucose.  Lower your risk of heart disease.  Improve your blood pressure.  Reach or maintain a healthy weight. What can affect my meal plan? Every person with diabetes is different, and each person has different needs for a meal plan. Your health care provider may recommend that you work with a dietitian to make a meal plan that is best for you. Your meal plan may vary depending on factors such as:  The calories you need.  The medicines you take.  Your weight.  Your blood glucose, blood pressure, and cholesterol levels.  Your activity level.  Other health conditions you have, such as heart or kidney disease. How do carbohydrates affect me? Carbohydrates, also called carbs, affect your blood glucose level more than any other type of food. Eating carbs naturally raises the amount of glucose in your blood. Carb counting is a method for keeping track of how many carbs you eat. Counting carbs is important to keep your blood glucose at a healthy level, especially if you use insulin or take certain oral diabetes medicines. It is important to know how many carbs you can safely have in each meal. This is different for every person. Your dietitian can help you calculate how many carbs you should have at each meal and for each snack. How does alcohol affect me? Alcohol can cause a sudden decrease in blood glucose (hypoglycemia), especially if you use insulin or take certain oral diabetes medicines. Hypoglycemia can be a life-threatening  condition. Symptoms of hypoglycemia, such as sleepiness, dizziness, and confusion, are similar to symptoms of having too much alcohol.  Do not drink alcohol if: ? Your health care provider tells you not to drink. ? You are pregnant, may be pregnant, or are planning to become pregnant.  If you drink alcohol: ? Do not drink on an empty stomach. ? Limit how much you use to:  0-1 drink a day for women.  0-2 drinks a day for men. ? Be aware of how much alcohol is in your drink. In the [place] one drink equals one 12 oz bottle of beer (355 mL), one 5 oz glass of wine (148 mL), or one 1 oz glass of hard liquor (44 mL). ? Keep yourself hydrated with water, diet soda, or unsweetened iced tea.  Keep in mind that regular soda, juice, and other mixers may contain a lot of sugar and must be counted as carbs. What are tips for following this plan? Reading food labels  Start by checking the serving size on the "Nutrition Facts" label of packaged foods and drinks. The amount of calories, carbs, fats, and other nutrients listed on the label is based on one serving of the item. Many items contain more than one serving per package.  Check the total grams (g) of carbs in one  serving. You can calculate the number of servings of carbs in one serving by dividing the total carbs by 15. For example, if a food has 30 g of total carbs per serving, it would be equal to 2 servings of carbs.  Check the number of grams (g) of saturated fats and trans fats in one serving. Choose foods that have a low amount or none of these fats.  Check the number of milligrams (mg) of salt (sodium) in one serving. Most people should limit total sodium intake to less than 2,300 mg per day.  Always check the nutrition information of foods labeled as "low-fat" or "nonfat." These foods may be higher in added sugar or refined carbs and should be avoided.  Talk to your dietitian to identify your daily goals for nutrients listed on the  label. Shopping  Avoid buying canned, pre-made, or processed foods. These foods tend to be high in fat, sodium, and added sugar.  Shop around the outside edge of the grocery store. This is where you will most often find fresh fruits and vegetables, bulk grains, fresh meats, and fresh dairy. Cooking  Use low-heat cooking methods, such as baking, instead of high-heat cooking methods like deep frying.  Cook using healthy oils, such as olive, canola, or sunflower oil.  Avoid cooking with butter, cream, or high-fat meats. Meal planning  Eat meals and snacks regularly, preferably at the same times every day. Avoid going long periods of time without eating.  Eat foods that are high in fiber, such as fresh fruits, vegetables, beans, and whole grains. Talk with your dietitian about how many servings of carbs you can eat at each meal.  Eat 4-6 oz (112-168 g) of lean protein each day, such as lean meat, chicken, fish, eggs, or tofu. One ounce (oz) of lean protein is equal to: ? 1 oz (28 g) of meat, chicken, or fish. ? 1 egg. ?  cup (62 g) of tofu.  Eat some foods each day that contain healthy fats, such as avocado, nuts, seeds, and fish.   What foods should I eat? Fruits Berries. Apples. Oranges. Peaches. Apricots. Plums. Grapes. Mango. Papaya. Pomegranate. Kiwi. Cherries. Vegetables Lettuce. Spinach. Leafy greens, including kale, chard, collard greens, and mustard greens. Beets. Cauliflower. Cabbage. Broccoli. Carrots. Green beans. Tomatoes. Peppers. Onions. Cucumbers. Brussels sprouts. Grains Whole grains, such as whole-wheat or whole-grain bread, crackers, tortillas, cereal, and pasta. Unsweetened oatmeal. Quinoa. Brown or wild rice. Meats and other proteins Seafood. Poultry without skin. Lean cuts of poultry and beef. Tofu. Nuts. Seeds. Dairy Low-fat or fat-free dairy products such as milk, yogurt, and cheese. The items listed above may not be a complete list of foods and beverages you  can eat. Contact a dietitian for more information. What foods should I avoid? Fruits Fruits canned with syrup. Vegetables Canned vegetables. Frozen vegetables with butter or cream sauce. Grains Refined white flour and flour products such as bread, pasta, snack foods, and cereals. Avoid all processed foods. Meats and other proteins Fatty cuts of meat. Poultry with skin. Breaded or fried meats. Processed meat. Avoid saturated fats. Dairy Full-fat yogurt, cheese, or milk. Beverages Sweetened drinks, such as soda or iced tea. The items listed above may not be a complete list of foods and beverages you should avoid. Contact a dietitian for more information. Questions to ask a health care provider  Do I need to meet with a diabetes educator?  Do I need to meet with a dietitian?  What number can I  call if I have questions?  When are the best times to check my blood glucose? Where to find more information:  American Diabetes Association: diabetes.org  Academy of Nutrition and Dietetics: www.eatright.[employer] of Diabetes and Digestive and Kidney Diseases: [employer].dk  Association of Diabetes Care and Education Specialists: www.diabeteseducator.org Summary  It is important to have healthy eating habits because your blood sugar (glucose) levels are greatly affected by what you eat and drink.  A healthy meal plan will help you control your blood glucose and maintain a healthy lifestyle.  Your health care provider may recommend that you work with a dietitian to make a meal plan that is best for you.  Keep in mind that carbohydrates (carbs) and alcohol have immediate effects on your blood glucose levels. It is important to count carbs and to use alcohol carefully. This information is not intended to replace advice given to you by your health care provider. Make sure you discuss any questions you have with your health care provider. Document Revised: 03/15/2019  Document Reviewed: 03/15/2019 Elsevier Patient Education  2021 [place].  [email or web address]">  DASH Eating Plan DASH stands for Dietary Approaches to Stop Hypertension. The DASH eating plan is a healthy eating plan that has been shown to:  Reduce high blood pressure (hypertension).  Reduce your risk for type 2 diabetes, heart disease, and stroke.  Help with weight loss. What are tips for following this plan? Reading food labels  Check food labels for the amount of salt (sodium) per serving. Choose foods with less than 5 percent of the Daily Value of sodium. Generally, foods with less than 300 milligrams (mg) of sodium per serving fit into this eating plan.  To find whole grains, look for the word "whole" as the first word in the ingredient list. Shopping  Buy products labeled as "low-sodium" or "no salt added."  Buy fresh foods. Avoid canned foods and pre-made or frozen meals. Cooking  Avoid adding salt when cooking. Use salt-free seasonings or herbs instead of table salt or sea salt. Check with your health care provider or pharmacist before using salt substitutes.  Do not fry foods. Cook foods using healthy methods such as baking, boiling, grilling, roasting, and broiling instead.  Cook with heart-healthy oils, such as olive, canola, avocado, soybean, or sunflower oil. Meal planning  Eat a balanced diet that includes: ? 4 or more servings of fruits and 4 or more servings of vegetables each day. Try to fill one-half of your plate with fruits and vegetables. ? 6-8 servings of whole grains each day. ? Less than 6 oz (170 g) of lean meat, poultry, or fish each day. A 3-oz (85-g) serving of meat is about the same size as a deck of cards. One egg equals 1 oz (28 g). ? 2-3 servings of low-fat dairy each day. One serving is 1 cup (237 mL). ? 1 serving of nuts, seeds, or beans 5 times each week. ? 2-3 servings of heart-healthy fats.  Healthy fats called omega-3 fatty acids are found in foods such as walnuts, flaxseeds, fortified milks, and eggs. These fats are also found in cold-water fish, such as sardines, salmon, and mackerel.  Limit how much you eat of: ? Canned or prepackaged foods. ? Food that is high in trans fat, such as some fried foods. ? Food that is high in saturated fat, such as fatty meat. ? Desserts and other sweets, sugary drinks, and other foods with added sugar. ? Full-fat dairy products.  Do not salt foods before eating.  Do not eat more than 4 egg yolks a week.  Try to eat at least 2 vegetarian meals a week.  Eat more home-cooked food and less restaurant, buffet, and fast food.   Lifestyle  When eating at a restaurant, ask that your food be prepared with less salt or no salt, if possible.  If you drink alcohol: ? Limit how much you use to:  0-1 drink a day for women who are not pregnant.  0-2 drinks a day for men. ? Be aware of how much alcohol is in your drink. In the [place] one drink equals one 12 oz bottle of beer (355 mL), one 5 oz glass of wine (148 mL), or one 1 oz glass of hard liquor (44 mL). General information  Avoid eating more than 2,300 mg of salt a day. If you have hypertension, you may need to reduce your sodium intake to 1,500 mg a day.  Work with your health care provider to maintain a healthy body weight or to lose weight. Ask what an ideal weight is for you.  Get at least 30 minutes of exercise that causes your heart to beat faster (aerobic exercise) most days of the week. Activities may include walking, swimming, or biking.  Work with your health care provider or dietitian to adjust your eating plan to your individual calorie needs. What foods should I eat? Fruits All fresh, dried, or frozen fruit. Canned fruit in natural juice (without added sugar). Vegetables Fresh or frozen vegetables (raw, steamed, roasted, or grilled). Low-sodium or reduced-sodium tomato and  vegetable juice. Low-sodium or reduced-sodium tomato sauce and tomato paste. Low-sodium or reduced-sodium canned vegetables. Grains Whole-grain or whole-wheat bread. Whole-grain or whole-wheat pasta. Brown rice. [place]. Bulgur. Whole-grain and low-sodium cereals. Pita bread. Low-fat, low-sodium crackers. Whole-wheat flour tortillas. Meats and other proteins Skinless chicken or [place]. Ground chicken or [place]. Pork with fat trimmed off. Fish and seafood. Egg whites. Dried beans, peas, or lentils. Unsalted nuts, nut butters, and seeds. Unsalted canned beans. Lean cuts of beef with fat trimmed off. Low-sodium, lean precooked or cured meat, such as sausages or meat loaves. Dairy Low-fat (1%) or fat-free (skim) milk. Reduced-fat, low-fat, or fat-free cheeses. Nonfat, low-sodium ricotta or cottage cheese. Low-fat or nonfat yogurt. Low-fat, low-sodium cheese. Fats and oils Soft margarine without trans fats. Vegetable oil. Reduced-fat, low-fat, or light mayonnaise and salad dressings (reduced-sodium). Canola, safflower, olive, avocado, soybean, and sunflower oils. Avocado. Seasonings and condiments Herbs. Spices. Seasoning mixes without salt. Other foods Unsalted popcorn and pretzels. Fat-free sweets. The items listed above may not be a complete list of foods and beverages you can eat. Contact a dietitian for more information. What foods should I avoid? Fruits Canned fruit in a light or heavy syrup. Fried fruit. Fruit in cream or butter sauce. Vegetables Creamed or fried vegetables. Vegetables in a cheese sauce. Regular canned vegetables (not low-sodium or reduced-sodium). Regular canned tomato sauce and paste (not low-sodium or reduced-sodium). Regular tomato and vegetable juice (not low-sodium or reduced-sodium). Angie Fava. Olives. Grains Baked goods made with fat, such as croissants, muffins, or some breads. Dry pasta or rice meal packs. Meats and other proteins Fatty cuts of meat. Ribs.  Fried meat. Berniece Salines. Bologna, salami, and other precooked or cured meats, such as sausages or meat loaves. Fat from the back of a pig (fatback). Bratwurst. Salted nuts and seeds. Canned beans with added salt. Canned or smoked fish. Whole eggs or egg yolks. Chicken or  [place] with skin. Dairy Whole or 2% milk, cream, and half-and-half. Whole or full-fat cream cheese. Whole-fat or sweetened yogurt. Full-fat cheese. Nondairy creamers. Whipped toppings. Processed cheese and cheese spreads. Fats and oils Butter. Stick margarine. Lard. Shortening. Ghee. Bacon fat. Tropical oils, such as coconut, palm kernel, or palm oil. Seasonings and condiments Onion salt, garlic salt, seasoned salt, table salt, and sea salt. Worcestershire sauce. Tartar sauce. Barbecue sauce. Teriyaki sauce. Soy sauce, including reduced-sodium. Steak sauce. Canned and packaged gravies. Fish sauce. Oyster sauce. Cocktail sauce. Store-bought horseradish. Ketchup. Mustard. Meat flavorings and tenderizers. Bouillon cubes. Hot sauces. Pre-made or packaged marinades. Pre-made or packaged taco seasonings. Relishes. Regular salad dressings. Other foods Salted popcorn and pretzels. The items listed above may not be a complete list of foods and beverages you should avoid. Contact a dietitian for more information. Where to find more information  National Heart, Lung, and Blood Institute: https://wilson-eaton.com/  American Heart Association: www.heart.org  Academy of Nutrition and Dietetics: www.eatright.[place]: www.kidney.org Summary  The DASH eating plan is a healthy eating plan that has been shown to reduce high blood pressure (hypertension). It may also reduce your risk for type 2 diabetes, heart disease, and stroke.  When on the DASH eating plan, aim to eat more fresh fruits and vegetables, whole grains, lean proteins, low-fat dairy, and heart-healthy fats.  With the DASH eating plan, you should limit salt (sodium)  intake to 2,300 mg a day. If you have hypertension, you may need to reduce your sodium intake to 1,500 mg a day.  Work with your health care provider or dietitian to adjust your eating plan to your individual calorie needs. This information is not intended to replace advice given to you by your health care provider. Make sure you discuss any questions you have with your health care provider. Document Revised: 03/11/2019 Document Reviewed: 03/11/2019 Elsevier Patient Education  2021 [employer].

## 2020-06-12 ENCOUNTER — Other Ambulatory Visit: Payer: Self-pay | Admitting: Family Medicine

## 2020-06-12 DIAGNOSIS — E785 Hyperlipidemia, unspecified: Secondary | ICD-10-CM

## 2020-06-12 DIAGNOSIS — E1169 Type 2 diabetes mellitus with other specified complication: Secondary | ICD-10-CM

## 2020-06-30 ENCOUNTER — Other Ambulatory Visit: Payer: Self-pay | Admitting: Family Medicine

## 2020-06-30 DIAGNOSIS — I10 Essential (primary) hypertension: Secondary | ICD-10-CM

## 2020-06-30 DIAGNOSIS — E119 Type 2 diabetes mellitus without complications: Secondary | ICD-10-CM

## 2020-07-01 NOTE — Telephone Encounter (Signed)
Requested Prescriptions  Pending Prescriptions Disp Refills  . losartan (COZAAR) 50 MG tablet [Pharmacy Med Name: Losartan Potassium 50 MG Oral Tablet] 90 tablet 3    Sig: TAKE 1 TABLET BY MOUTH  DAILY     Cardiovascular:  Angiotensin Receptor Blockers Passed - 06/30/2020 10:14 PM      Passed - Cr in normal range and within 180 days    Creat  Date Value Ref Range Status  01/24/2020 0.83 0.50 - 0.99 mg/dL Final    Comment:    For patients >70 years of age, the reference limit for Creatinine is approximately 13% higher for people identified as African-American. .          Passed - K in normal range and within 180 days    Potassium  Date Value Ref Range Status  01/24/2020 4.3 3.5 - 5.3 mmol/L Final         Passed - Patient is not pregnant      Passed - Last BP in normal range    BP Readings from Last 1 Encounters:  02/01/20 (!) 127/55         Passed - Valid encounter within last 6 months    Recent Outpatient Visits          5 months ago Annual physical exam   [facility] Olin Hauser, DO   11 months ago Type 2 diabetes mellitus with other specified complication, without long-term current use of insulin ([place])   [place], DO   1 year ago Annual physical exam   [facility] Olin Hauser, DO   1 year ago Type 2 diabetes mellitus with other specified complication, without long-term current use of insulin [facility])   [facility] Olin Hauser, DO   2 years ago Acute bronchitis, unspecified organism   [place], Devonne Doughty, DO      Future Appointments            In 1 month [place], Devonne Doughty, DO [facility], [facility]

## 2020-08-03 ENCOUNTER — Other Ambulatory Visit: Payer: Self-pay | Admitting: Family Medicine

## 2020-08-03 ENCOUNTER — Other Ambulatory Visit: Payer: Self-pay

## 2020-08-03 ENCOUNTER — Encounter: Payer: Self-pay | Admitting: Family Medicine

## 2020-08-03 ENCOUNTER — Ambulatory Visit (INDEPENDENT_AMBULATORY_CARE_PROVIDER_SITE_OTHER): Payer: Medicare Other | Admitting: Family Medicine

## 2020-08-03 VITALS — BP 124/68 | HR 72 | Ht 62.0 in | Wt 155.0 lb

## 2020-08-03 DIAGNOSIS — Z23 Encounter for immunization: Secondary | ICD-10-CM | POA: Diagnosis not present

## 2020-08-03 DIAGNOSIS — I10 Essential (primary) hypertension: Secondary | ICD-10-CM

## 2020-08-03 DIAGNOSIS — E785 Hyperlipidemia, unspecified: Secondary | ICD-10-CM

## 2020-08-03 DIAGNOSIS — E1169 Type 2 diabetes mellitus with other specified complication: Secondary | ICD-10-CM

## 2020-08-03 DIAGNOSIS — Z0000 Encounter for general adult medical examination without abnormal findings: Secondary | ICD-10-CM

## 2020-08-03 DIAGNOSIS — E663 Overweight: Secondary | ICD-10-CM

## 2020-08-03 LAB — POCT GLYCOSYLATED HEMOGLOBIN (HGB A1C): Hemoglobin A1C: 7 % — AB (ref 4.0–5.6)

## 2020-08-03 MED ORDER — SHINGRIX 50 MCG/0.5ML IM SUSR: INTRAMUSCULAR | 1 refills | Status: DC

## 2020-08-03 NOTE — Patient Instructions (Addendum)
Thank you for coming to the office today.  BP improved.  Recent Labs    01/24/20 0951 08/03/20 1341  HGBA1C 7.6* 7.0*   Keep on current treatment plan.  Metformin 500mg  twice a day.  Shingles vaccine. 2 doses apart 2-6 months. Caution side effects.  DUE for FASTING BLOOD WORK (no food or drink after midnight before the lab appointment, only water or coffee without cream/sugar on the morning of)  SCHEDULE "Lab Only" visit in the morning at the clinic for lab draw in 6 MONTHS   - Make sure Lab Only appointment is at about 1 week before your next appointment, so that results will be available  For Lab Results, once available within 2-3 days of blood draw, you can can log in to MyChart online to view your results and a brief explanation. Also, we can discuss results at next follow-up visit.    Please schedule a Follow-up Appointment to: Return in about 6 months (around 02/02/2021) for 6 month fasting lab only then 1 week later Annual Physical.  If you have any other questions or concerns, please feel free to call the office or send a message through [place]. You may also schedule an earlier appointment if necessary.  Additionally, you may be receiving a survey about your experience at our office within a few days to 1 week by e-mail or mail. We value your feedback.  [name], DO [place]

## 2020-08-03 NOTE — Progress Notes (Signed)
Subjective:    Patient ID: Andrea Dodson, female    DOB: [birth year], 70 y.o.   MRN: [id number]  Andrea Dodson is a 70 y.o. female presenting on 08/03/2020 for Diabetes   HPI   CHRONIC DM, Type 2/ Overweight BMI >28 Hyperlipidemia Home nurse visit A1c at 6.4 recently 06/2020, now due today A1c CBGs:stable readings Meds: Metformin 500mg  BID Tolerating well w/o side-effects CurrentlyARB/ASA daily, on Statin - Rosuvastatin 10mg  Lifestyle: - Weightstableto improved - Diet improved DM diet. Reduced portion size, reduced candy. - Exercise (walkingnow goal to resume exercise)  - Due for upcoming DM Eye exam 10/2019 Denies hypoglycemia  CHRONIC HTN: Reportsno new concerns.Home BP normal Today mild elevated initial BP Home nurse check BP normal. Current Meds -Losartan 50mg  daily - Prior history w/ ACEi cough on Lisinopril Reports good compliance, took meds today. Tolerating well, w/o complaints.   Health Maintenance:  Due for Shingrix vaccine, offered this, printed. PMH - Seasonal allergies - taking Loratadine 10mg  daily, controls her allergy symptoms, has done well this season  Last cologuard 02/15/20 negative, next due 01/2023  Mammogram UTD 09/2019 - next due 2023.  UTD Pneumonia vaccine series    Depression screen [facility] 2/9 02/21/2020 02/01/2020 07/25/2019  Decreased Interest - 0 0  Down, Depressed, Hopeless 0 0 0  PHQ - 2 Score 0 0 0  Altered sleeping 0 - -  Tired, decreased energy 0 - -  Change in appetite 0 - -  Feeling bad or failure about yourself  0 - -  Trouble concentrating 0 - -  Moving slowly or fidgety/restless 0 - -  Suicidal thoughts 0 - -  PHQ-9 Score 0 - -  Difficult doing work/chores Not difficult at all - -    Social History   Tobacco Use  . Smoking status: Never Smoker  . Smokeless tobacco: Never Used  Vaping Use  . Vaping Use: Never used  Substance Use Topics  . Alcohol use: No  . Drug use: No    Review of  Systems Per HPI unless specifically indicated above     Objective:    BP 124/68 (BP Location: Left Arm, Cuff Size: Normal)   Pulse 72   Ht 5\' 2"  (1.575 m)   Wt 155 lb (70.3 kg)   SpO2 99%   BMI 28.35 kg/m   Wt Readings from Last 3 Encounters:  08/03/20 155 lb (70.3 kg)  02/21/20 156 lb (70.8 kg)  02/01/20 159 lb (72.1 kg)    Physical Exam Vitals and nursing note reviewed.  Constitutional:      General: She is not in acute distress.    Appearance: She is well-developed. She is not diaphoretic.     Comments: Well-appearing, comfortable, cooperative  HENT:     Head: Normocephalic and atraumatic.  Eyes:     General:        Right eye: No discharge.        Left eye: No discharge.     Conjunctiva/sclera: Conjunctivae normal.  Cardiovascular:     Rate and Rhythm: Normal rate.  Pulmonary:     Effort: Pulmonary effort is normal.  Skin:    General: Skin is warm and dry.     Findings: No erythema or rash.  Neurological:     Mental Status: She is alert and oriented to person, place, and time.  Psychiatric:        Behavior: Behavior normal.     Comments: Well groomed, good eye contact, normal speech and thoughts  Recent Labs    01/24/20 0951 08/03/20 1341  HGBA1C 7.6* 7.0*     Results for orders placed or performed in visit on 08/03/20  POCT HgB A1C  Result Value Ref Range   Hemoglobin A1C 7.0 (A) 4.0 - 5.6 %      Assessment & Plan:   Problem List Items Addressed This Visit    Type 2 diabetes mellitus with other specified complication ([place]) - Primary    Improved A1c to 7.0 Recent home nurse check at 6.4 No hypoglycemia Complications - other including hyperlipidemia, obesity - increases risk of future cardiovascular complications   Plan:  1. Continue Metformin IR 500mg  BID 2. Encourage improved lifestyle - low carb, low sugar diet, reduce portion size, continue improving regular exercise 3. Check CBG, bring log to next visit for review 4. Continue ASA,  ARB, Statin  Due DM Eye exam      Relevant Orders   POCT HgB A1C (Completed)   Essential hypertension    Controlled - repeat reading improved Home readings normal No known complications  Failed: Lisinopril (ACEi cough)    Plan:  1. Continue current BP regimen Losartan 50mg  daily 2. Encourage improved lifestyle - low sodium diet, regular exercise 3. Continue monitor BP outside office, bring readings to next visit, if persistently >140/90 or new symptoms notify office sooner       Other Visit Diagnoses    Need for shingles vaccine       Relevant Medications   SHINGRIX injection      Meds ordered this encounter  Medications  . SHINGRIX injection    Sig: Inject 0.5 mL into muscle for shingles vaccine. Repeat dose in 2-6 months.    Dispense:  0.5 mL    Refill:  1      Follow up plan: Return in about 6 months (around 02/02/2021) for 6 month fasting lab only then 1 week later Annual Physical.  Future labs ordered for 01/28/21  [name], [facility] 08/03/2020, 1:37 PM

## 2020-08-03 NOTE — Assessment & Plan Note (Signed)
Controlled - repeat reading improved Home readings normal No known complications  Failed: Lisinopril (ACEi cough)    Plan:  1. Continue current BP regimen Losartan 50mg  daily 2. Encourage improved lifestyle - low sodium diet, regular exercise 3. Continue monitor BP outside office, bring readings to next visit, if persistently >140/90 or new symptoms notify office sooner

## 2020-08-03 NOTE — Progress Notes (Signed)
po

## 2020-08-03 NOTE — Assessment & Plan Note (Signed)
Improved A1c to 7.0 Recent home nurse check at 6.4 No hypoglycemia Complications - other including hyperlipidemia, obesity - increases risk of future cardiovascular complications   Plan:  1. Continue Metformin IR 500mg  BID 2. Encourage improved lifestyle - low carb, low sugar diet, reduce portion size, continue improving regular exercise 3. Check CBG, bring log to next visit for review 4. Continue ASA, ARB, Statin  Due DM Eye exam

## 2020-08-06 ENCOUNTER — Telehealth: Payer: Self-pay | Admitting: General Practice

## 2020-08-06 ENCOUNTER — Telehealth: Payer: Self-pay

## 2020-08-06 NOTE — Telephone Encounter (Signed)
  Chronic Care Management   Outreach Note  08/06/2020 Name: Andrea Dodson MRN: [id number] DOB: [birth year]  Referred by: Olin Hauser, DO Reason for referral : Appointment (RNCM: Follow up for Chronic Disease Management and Care Coordination Needs)   An unsuccessful telephone outreach was attempted today. The patient was referred to the case management team for assistance with care management and care coordination.   Follow Up Plan: A HIPAA compliant phone message was left for the patient providing contact information and requesting a return call.    Noreene Larsson RN, MSN, [place] Mobile: [phone]

## 2020-08-09 NOTE — Telephone Encounter (Signed)
Patient has been rescheduled.

## 2020-08-16 ENCOUNTER — Telehealth: Payer: Medicare Other

## 2020-08-16 ENCOUNTER — Telehealth: Payer: Self-pay | Admitting: General Practice

## 2020-08-16 NOTE — Telephone Encounter (Signed)
  Chronic Care Management   Outreach Note  08/16/2020 Name: Andrea Dodson MRN: [id number] DOB: 1950/06/06  Referred by: Olin Hauser, DO Reason for referral : Appointment (RNCM: Follow up for Chronic Disease Management and Care Coordination Needs)   An unsuccessful telephone outreach was attempted today. The patient was referred to the case management team for assistance with care management and care coordination.   Follow Up Plan: The care management team will reach out to the patient again over the next 30 days.    Noreene Larsson RN, MSN, [place] Mobile: [phone]

## 2020-08-23 ENCOUNTER — Telehealth: Payer: Medicare Other | Admitting: General Practice

## 2020-08-23 ENCOUNTER — Ambulatory Visit (INDEPENDENT_AMBULATORY_CARE_PROVIDER_SITE_OTHER): Payer: Medicare Other | Admitting: General Practice

## 2020-08-23 DIAGNOSIS — E1169 Type 2 diabetes mellitus with other specified complication: Secondary | ICD-10-CM | POA: Diagnosis not present

## 2020-08-23 DIAGNOSIS — I10 Essential (primary) hypertension: Secondary | ICD-10-CM

## 2020-08-23 DIAGNOSIS — E785 Hyperlipidemia, unspecified: Secondary | ICD-10-CM

## 2020-08-23 NOTE — Chronic Care Management (AMB) (Signed)
Chronic Care Management   CCM RN Visit Note  08/23/2020 Name: Andrea Dodson MRN: [id number] DOB: Jul 20, 1950  Subjective: Andrea Dodson is a 70 y.o. year old female who is a primary care patient of Olin Hauser, DO. The care management team was consulted for assistance with disease management and care coordination needs.    Engaged with patient by telephone for follow up visit in response to provider referral for case management and/or care coordination services.   Consent to Services:  The patient was given information about Chronic Care Management services, agreed to services, and gave verbal consent prior to initiation of services.  Please see initial visit note for detailed documentation.   Patient agreed to services and verbal consent obtained.   Assessment: Review of patient past medical history, allergies, medications, health status, including review of consultants reports, laboratory and other test data, was performed as part of comprehensive evaluation and provision of chronic care management services.   SDOH (Social Determinants of Health) assessments and interventions performed:    CCM Care Plan  Allergies  Allergen Reactions  . Codeine Nausea And Vomiting and Other (See Comments)    Nausea vomiting,  Muscle weakness  . Lisinopril Cough    ACEi-cough    Outpatient Encounter Medications as of 08/23/2020  Medication Sig  . aspirin EC 81 MG tablet Take 1 tablet (81 mg total) by mouth daily.  . Blood Glucose Monitoring Suppl (ONE TOUCH ULTRA 2) w/Device KIT 1 device for checking blood sugar once daily  . fluticasone (FLONASE) 50 MCG/ACT nasal spray Place 2 sprays into both nostrils daily.  Marland Kitchen glucose blood (ONE TOUCH ULTRA TEST) test strip CHECK BLOOD SUGAR ONCE DAILY  . Krill Oil 350 MG CAPS Take 350 mg by mouth daily.  . Lancets (ONETOUCH ULTRASOFT) lancets Use as instructed  . loratadine (CLARITIN) 10 MG tablet Take 1 tablet (10 mg total) by mouth daily.  Marland Kitchen  losartan (COZAAR) 50 MG tablet TAKE 1 TABLET BY MOUTH  DAILY  . metFORMIN (GLUCOPHAGE) 500 MG tablet TAKE 1 TABLET BY MOUTH  TWICE DAILY WITH A MEAL  . rosuvastatin (CRESTOR) 10 MG tablet TAKE 1 TABLET BY MOUTH DAILY  . SHINGRIX injection Inject 0.5 mL into muscle for shingles vaccine. Repeat dose in 2-6 months.   No facility-administered encounter medications on file as of 08/23/2020.    Patient Active Problem List   Diagnosis Date Noted  . Salivary gland swelling 02/11/2019  . Environmental and seasonal allergies 08/12/2018  . Hyperlipidemia associated with type 2 diabetes mellitus ([place]) 01/20/2017  . Overweight (BMI 25.0-29.9) 07/11/2016  . Type 2 diabetes mellitus with other specified complication ([place]) 66/09/3014  . Essential hypertension 07/11/2016  . Colon cancer screening 07/11/2016  . H/O seasonal allergies 07/08/2016  . Osteoarthritis of right knee 07/08/2016  . Urinary incontinence 07/08/2016    Conditions to be addressed/monitored:HTN, HLD and DMII  Care Plan : RNCM: HLD  Updates made by Vanita Ingles since 08/23/2020 12:00 AM    Problem: RNCM: HLD Health Promotion or Disease Self-Management (General Plan of Care)   Priority: Medium    Long-Range Goal: RNCM: Management of HLD   Recent Progress: On track  Priority: Medium  Note:   Current Barriers:  . Poorly controlled hyperlipidemia, complicated by eating choices at times, elevated hemoglobin A1C of 7.6 on 02-01-2020.  Most recent 08-03-2020, 7.0 % . Current antihyperlipidemic regimen: Rosuvastatin 10 mg QD . Most recent lipid panel:     Component Value Date/Time  CHOL 123 01/24/2020 0951   CHOL 202 (H) 07/15/2016 1106   TRIG 165 (H) 01/24/2020 0951   HDL 51 01/24/2020 0951   HDL 50 07/15/2016 1106   CHOLHDL 2.4 01/24/2020 0951   LDLCALC 48 01/24/2020 0951 .   Marland Kitchen ASCVD risk enhancing conditions: age >39, DM, HTN . Unable to independently manage HLD . Does not contact provider office for  questions/concerns  RN Care Manager Clinical Goal(s):  Marland Kitchen Over the next 120 days, patient will work with Consulting civil engineer, providers, and care team towards execution of optimized self-health management plan . patient will verbalize understanding of plan for effective management of HLD . patient will work with [facility] and pcp  to address needs related to HLD  . patient will attend all scheduled medical appointments:02-04-2021  Interventions: . Collaboration with Olin Hauser, DO regarding development and update of comprehensive plan of care as evidenced by provider attestation and co-signature . Inter-disciplinary care team collaboration (see longitudinal plan of care) . Medication review performed; medication list updated in electronic medical record.  Bertram Savin care team collaboration (see longitudinal plan of care) . Referred to pharmacy team for assistance with HLD medication management . Evaluation of current treatment plan related to HLD and patient's adherence to plan as established by provider. 08-23-2020: The patient is doing well and denies any acute distress. The patient states she is eating well and taking medications as directed.  . Advised patient to call the office for changes in conditions or questions  . Provided education to patient re: Heart Healthy/ADA diet, rest, and monitoring for changes in heatlh . Reviewed scheduled/upcoming provider appointments including: 02-04-2021 . Discussed plans with patient for ongoing care management follow up and provided patient with direct contact information for care management team   Patient Goals/Self-Care Activities: . Over the next 120 days, patient will:   - call for medicine refill 2 or 3 days before it runs out - call if I am sick and can't take my medicine - keep a list of all the medicines I take; vitamins and herbals too - learn to read medicine labels - use a pillbox to sort medicine - use an alarm clock or  phone to remind me to take my medicine - change to whole grain breads, cereal, pasta - drink 6 to 8 glasses of water each day - eat 5 or 6 small meals each day - fill half the plate with nonstarchy vegetables - limit fast food meals to no more than 1 per week - manage portion size - prepare main meal at home 3 to 5 days each week - read food labels for fat, fiber, carbohydrates and portion size - be open to making changes - I can manage, know and watch for signs of a heart attack - if I have chest pain, call for help - learn about small changes that will make a big difference - learn my personal risk factors  - barriers to meeting goals identified - change-talk evoked - choices provided - collaboration with team encouraged - decision-making supported - health risks reviewed - problem-solving facilitated - questions answered - readiness for change evaluated - reassurance provided - self-reflection promoted - self-reliance encouraged  Follow Up Plan: Telephone follow up appointment with care management team member scheduled for: 11-08-2020 at 1 pm     Care Plan : RNCM: Hypertension (Adult)  Updates made by Vanita Ingles since 08/23/2020 12:00 AM    Problem: RNCM: Hypertension (Hypertension)   Priority:  Medium    Long-Range Goal: RNCM: Hypertension Monitored   Priority: Medium  Note:   Objective:  . Last practice recorded BP readings:  . BP Readings from Last 3 Encounters: .  08/03/20 . 124/68 .  02/01/20 . (!) 127/55 .  09/05/19 . 129/66 .    Marland Kitchen Most recent eGFR/CrCl: No results found for: EGFR  No components found for: CRCL Current Barriers:  Marland Kitchen Knowledge Deficits related to basic understanding of hypertension pathophysiology and self care management . Knowledge Deficits related to understanding of medications prescribed for management of hypertension . Does not contact provider office for questions/concerns Case Manager Clinical Goal(s):  Marland Kitchen Over the next 120 days,  patient will verbalize understanding of plan for hypertension management . Over the next 120 days, patient will attend all scheduled medical appointments: 02-04-2021 . Over the next 120 days, patient will demonstrate improved adherence to prescribed treatment plan for hypertension as evidenced by taking all medications as prescribed, monitoring and recording blood pressure as directed, adhering to low sodium/DASH diet . Over the next 120 days, patient will demonstrate improved health management independence as evidenced by checking blood pressure as directed and notifying PCP if SBP>160 or DBP > 90, taking all medications as prescribe, and adhering to a low sodium diet as discussed. . Over the next 120 days, patient will verbalize basic understanding of hypertension disease process and self health management plan as evidenced by normalized blood pressure, adherence to dietary restrictions, working with CCM team to optimize health and well being  Interventions:  . Collaboration with Olin Hauser, DO regarding development and update of comprehensive plan of care as evidenced by provider attestation and co-signature . Inter-disciplinary care team collaboration (see longitudinal plan of care) . Evaluation of current treatment plan related to hypertension self management and patient's adherence to plan as established by provider. 08-23-2020: The patient saw the pcp on 08-03-2020 and got a good report. The patient states she is doing well and denies any issues with HTN.  . Provided education to patient re: stroke prevention, s/s of heart attack and stroke, DASH diet, complications of uncontrolled blood pressure . Reviewed medications with patient and discussed importance of compliance. 08-23-2020: Endorses compliance with medications. No issues with getting medications.  . Discussed plans with patient for ongoing care management follow up and provided patient with direct contact information for care  management team . Advised patient, providing education and rationale, to monitor blood pressure daily and record, calling PCP for findings outside established parameters.  . Reviewed scheduled/upcoming provider appointments including: 02-04-2021 Patient Goals/Self-Care Activities . Over the next 120 days, patient will:  - Self administers medications as prescribed Attends all scheduled provider appointments Calls provider office for new concerns, questions, or BP outside discussed parameters Checks BP and records as discussed Follows a low sodium diet/DASH diet - blood pressure trends reviewed - depression screen reviewed - home or ambulatory blood pressure monitoring encouraged Follow Up Plan: Telephone follow up appointment with care management team member scheduled for: 11-08-2020 at 1 pm   Care Plan : RNCM: Diabetes Type 2 (Adult)  Updates made by Vanita Ingles since 08/23/2020 12:00 AM    Problem: RNCM: Glycemic Management (Diabetes, Type 2)   Priority: Medium    Long-Range Goal: RNCM: Glycemic Management Optimized   Priority: Medium  Note:   Objective:  . Lab Results .  Component . Value . Date .   Marland Kitchen HGBA1C . 7.0 (A) . 08/03/2020 .    Marland Kitchen  Lab Results .  Component . Value . Date .   Marland Kitchen CREATININE . 0.83 . 01/24/2020 .   Marland Kitchen BUN . 14 . 01/24/2020 .   . NA . 137 . 01/24/2020 .   . K . 4.3 . 01/24/2020 .   . CL . 103 . 01/24/2020 .   Marland Kitchen CO2 . 22 . 01/24/2020 .     Marland Kitchen No results found for: EGFR Current Barriers:  Marland Kitchen Knowledge Deficits related to basic Diabetes pathophysiology and self care/management . Knowledge Deficits related to medications used for management of diabetes . Does not contact provider office for questions/concerns Case Manager Clinical Goal(s):  . patient will demonstrate improved adherence to prescribed treatment plan for diabetes self care/management as evidenced by: daily monitoring and recording of CBG  adherence to ADA/ carb modified diet exercise 4/5  days/week adherence to prescribed medication regimen contacting provider for new or worsened symptoms or questions Interventions:  . Collaboration with Olin Hauser, DO regarding development and update of comprehensive plan of care as evidenced by provider attestation and co-signature . Inter-disciplinary care team collaboration (see longitudinal plan of care) . Provided education to patient about basic DM disease process . Reviewed medications with patient and discussed importance of medication adherence . Discussed plans with patient for ongoing care management follow up and provided patient with direct contact information for care management team . Provided patient with written educational materials related to hypo and hyperglycemia and importance of correct treatment . Reviewed scheduled/upcoming provider appointments including: 02-04-2021 . Advised patient, providing education and rationale, to check cbg bid and record, calling pcp for findings outside established parameters.  The patient states her blood sugars have been around "150".  Education on fasting <130 and post prandial <180.  The patient states hers is sometimes greater than 150 but that is usually when she has eat "bad".  The patient states that she is trying to watch her diet better since the holidays are over. 08-23-2020: The patient states she is doing better with managing  her DM. She sometimes eats things that are not the best for her but she has a better hemoglobin A1C at last appointment in April. She is also more active now that the weather has changed. She enjoys the pretty weather and doing her craft shows once a month. The patient states that she is doing well.  . Review of patient status, including review of consultants reports, relevant laboratory and other test results, and medications completed. Patient Goals/Self-Care Activities - Self administers oral medications as prescribed Self administers insulin as  prescribed Attends all scheduled provider appointments Checks blood sugars as prescribed and utilize hyper and hypoglycemia protocol as needed Adheres to prescribed ADA/carb modified - barriers to adherence to treatment plan identified - blood glucose monitoring encouraged - blood glucose readings reviewed - individualized medical nutrition therapy provided - mutual A1C goal set or reviewed - self-awareness of signs/symptoms of hypo or hyperglycemia encouraged - use of blood glucose monitoring log promoted Follow Up Plan: Telephone follow up appointment with care management team member scheduled for: 11-08-2020 at 1 pm     Plan:Telephone follow up appointment with care management team member scheduled for:  11-08-2020 at 1 pm  [place], MSN, [place] Mobile: [phone]

## 2020-08-23 NOTE — Patient Instructions (Signed)
Visit Information  PATIENT GOALS: Patient Care Plan: RNCM: HLD    Problem Identified: RNCM: HLD Health Promotion or Disease Self-Management (General Plan of Care)   Priority: Medium    Long-Range Goal: RNCM: Management of HLD   Recent Progress: On track  Priority: Medium  Note:   Current Barriers:  . Poorly controlled hyperlipidemia, complicated by eating choices at times, elevated hemoglobin A1C of 7.6 on 02-01-2020.  Most recent 08-03-2020, 7.0 % . Current antihyperlipidemic regimen: Rosuvastatin 10 mg QD . Most recent lipid panel:     Component Value Date/Time   CHOL 123 01/24/2020 0951   CHOL 202 (H) 07/15/2016 1106   TRIG 165 (H) 01/24/2020 0951   HDL 51 01/24/2020 0951   HDL 50 07/15/2016 1106   CHOLHDL 2.4 01/24/2020 0951   LDLCALC 48 01/24/2020 0951 .   Marland Kitchen ASCVD risk enhancing conditions: age >29, DM, HTN . Unable to independently manage HLD . Does not contact provider office for questions/concerns  RN Care Manager Clinical Goal(s):  Marland Kitchen Over the next 120 days, patient will work with Consulting civil engineer, providers, and care team towards execution of optimized self-health management plan . patient will verbalize understanding of plan for effective management of HLD . patient will work with [facility] and pcp  to address needs related to HLD  . patient will attend all scheduled medical appointments:02-04-2021  Interventions: . Collaboration with Olin Hauser, DO regarding development and update of comprehensive plan of care as evidenced by provider attestation and co-signature . Inter-disciplinary care team collaboration (see longitudinal plan of care) . Medication review performed; medication list updated in electronic medical record.  Bertram Savin care team collaboration (see longitudinal plan of care) . Referred to pharmacy team for assistance with HLD medication management . Evaluation of current treatment plan related to HLD and patient's adherence to plan  as established by provider. 08-23-2020: The patient is doing well and denies any acute distress. The patient states she is eating well and taking medications as directed.  . Advised patient to call the office for changes in conditions or questions  . Provided education to patient re: Heart Healthy/ADA diet, rest, and monitoring for changes in heatlh . Reviewed scheduled/upcoming provider appointments including: 02-04-2021 . Discussed plans with patient for ongoing care management follow up and provided patient with direct contact information for care management team   Patient Goals/Self-Care Activities: . Over the next 120 days, patient will:   - call for medicine refill 2 or 3 days before it runs out - call if I am sick and can't take my medicine - keep a list of all the medicines I take; vitamins and herbals too - learn to read medicine labels - use a pillbox to sort medicine - use an alarm clock or phone to remind me to take my medicine - change to whole grain breads, cereal, pasta - drink 6 to 8 glasses of water each day - eat 5 or 6 small meals each day - fill half the plate with nonstarchy vegetables - limit fast food meals to no more than 1 per week - manage portion size - prepare main meal at home 3 to 5 days each week - read food labels for fat, fiber, carbohydrates and portion size - be open to making changes - I can manage, know and watch for signs of a heart attack - if I have chest pain, call for help - learn about small changes that will make a big difference - learn  my personal risk factors  - barriers to meeting goals identified - change-talk evoked - choices provided - collaboration with team encouraged - decision-making supported - health risks reviewed - problem-solving facilitated - questions answered - readiness for change evaluated - reassurance provided - self-reflection promoted - self-reliance encouraged  Follow Up Plan: Telephone follow up appointment  with care management team member scheduled for: 11-08-2020 at 1 pm     Task: RNCM: Goals for HLD   Note:   Care Management Activities:    - barriers to meeting goals identified - change-talk evoked - choices provided - collaboration with team encouraged - decision-making supported - health risks reviewed - problem-solving facilitated - questions answered - readiness for change evaluated - reassurance provided - self-reflection promoted - self-reliance encouraged       Patient Care Plan: RNCM: Hypertension (Adult)    Problem Identified: RNCM: Hypertension (Hypertension)   Priority: Medium    Long-Range Goal: RNCM: Hypertension Monitored   Priority: Medium  Note:   Objective:  . Last practice recorded BP readings:  . BP Readings from Last 3 Encounters: .  08/03/20 . 124/68 .  02/01/20 . (!) 127/55 .  09/05/19 . 129/66 .    Marland Kitchen Most recent eGFR/CrCl: No results found for: EGFR  No components found for: CRCL Current Barriers:  Marland Kitchen Knowledge Deficits related to basic understanding of hypertension pathophysiology and self care management . Knowledge Deficits related to understanding of medications prescribed for management of hypertension . Does not contact provider office for questions/concerns Case Manager Clinical Goal(s):  Marland Kitchen Over the next 120 days, patient will verbalize understanding of plan for hypertension management . Over the next 120 days, patient will attend all scheduled medical appointments: 02-04-2021 . Over the next 120 days, patient will demonstrate improved adherence to prescribed treatment plan for hypertension as evidenced by taking all medications as prescribed, monitoring and recording blood pressure as directed, adhering to low sodium/DASH diet . Over the next 120 days, patient will demonstrate improved health management independence as evidenced by checking blood pressure as directed and notifying PCP if SBP>160 or DBP > 90, taking all medications as  prescribe, and adhering to a low sodium diet as discussed. . Over the next 120 days, patient will verbalize basic understanding of hypertension disease process and self health management plan as evidenced by normalized blood pressure, adherence to dietary restrictions, working with CCM team to optimize health and well being  Interventions:  . Collaboration with Olin Hauser, DO regarding development and update of comprehensive plan of care as evidenced by provider attestation and co-signature . Inter-disciplinary care team collaboration (see longitudinal plan of care) . Evaluation of current treatment plan related to hypertension self management and patient's adherence to plan as established by provider. 08-23-2020: The patient saw the pcp on 08-03-2020 and got a good report. The patient states she is doing well and denies any issues with HTN.  . Provided education to patient re: stroke prevention, s/s of heart attack and stroke, DASH diet, complications of uncontrolled blood pressure . Reviewed medications with patient and discussed importance of compliance. 08-23-2020: Endorses compliance with medications. No issues with getting medications.  . Discussed plans with patient for ongoing care management follow up and provided patient with direct contact information for care management team . Advised patient, providing education and rationale, to monitor blood pressure daily and record, calling PCP for findings outside established parameters.  . Reviewed scheduled/upcoming provider appointments including: 02-04-2021 Patient Goals/Self-Care Activities . Over the  next 120 days, patient will:  - Self administers medications as prescribed Attends all scheduled provider appointments Calls provider office for new concerns, questions, or BP outside discussed parameters Checks BP and records as discussed Follows a low sodium diet/DASH diet - blood pressure trends reviewed - depression screen  reviewed - home or ambulatory blood pressure monitoring encouraged Follow Up Plan: Telephone follow up appointment with care management team member scheduled for: 11-08-2020 at 1 pm   Task: RNCM: Identify and Monitor Blood Pressure Elevation   Note:   Care Management Activities:    - blood pressure trends reviewed - depression screen reviewed - home or ambulatory blood pressure monitoring encouraged       Patient Care Plan: RNCM: Diabetes Type 2 (Adult)    Problem Identified: RNCM: Glycemic Management (Diabetes, Type 2)   Priority: Medium    Long-Range Goal: RNCM: Glycemic Management Optimized   Priority: Medium  Note:   Objective:  . Lab Results .  Component . Value . Date .   Marland Kitchen HGBA1C . 7.0 (A) . 08/03/2020 .    Marland Kitchen Lab Results .  Component . Value . Date .   Marland Kitchen CREATININE . 0.83 . 01/24/2020 .   Marland Kitchen BUN . 14 . 01/24/2020 .   . NA . 137 . 01/24/2020 .   . K . 4.3 . 01/24/2020 .   . CL . 103 . 01/24/2020 .   Marland Kitchen CO2 . 22 . 01/24/2020 .     Marland Kitchen No results found for: EGFR Current Barriers:  Marland Kitchen Knowledge Deficits related to basic Diabetes pathophysiology and self care/management . Knowledge Deficits related to medications used for management of diabetes . Does not contact provider office for questions/concerns Case Manager Clinical Goal(s):  . patient will demonstrate improved adherence to prescribed treatment plan for diabetes self care/management as evidenced by: daily monitoring and recording of CBG  adherence to ADA/ carb modified diet exercise 4/5 days/week adherence to prescribed medication regimen contacting provider for new or worsened symptoms or questions Interventions:  . Collaboration with Olin Hauser, DO regarding development and update of comprehensive plan of care as evidenced by provider attestation and co-signature . Inter-disciplinary care team collaboration (see longitudinal plan of care) . Provided education to patient about basic DM disease  process . Reviewed medications with patient and discussed importance of medication adherence . Discussed plans with patient for ongoing care management follow up and provided patient with direct contact information for care management team . Provided patient with written educational materials related to hypo and hyperglycemia and importance of correct treatment . Reviewed scheduled/upcoming provider appointments including: 02-04-2021 . Advised patient, providing education and rationale, to check cbg bid and record, calling pcp for findings outside established parameters.  The patient states her blood sugars have been around "150".  Education on fasting <130 and post prandial <180.  The patient states hers is sometimes greater than 150 but that is usually when she has eat "bad".  The patient states that she is trying to watch her diet better since the holidays are over. 08-23-2020: The patient states she is doing better with managing  her DM. She sometimes eats things that are not the best for her but she has a better hemoglobin A1C at last appointment in April. She is also more active now that the weather has changed. She enjoys the pretty weather and doing her craft shows once a month. The patient states that she is doing well.  . Review of patient  status, including review of consultants reports, relevant laboratory and other test results, and medications completed. Patient Goals/Self-Care Activities - Self administers oral medications as prescribed Self administers insulin as prescribed Attends all scheduled provider appointments Checks blood sugars as prescribed and utilize hyper and hypoglycemia protocol as needed Adheres to prescribed ADA/carb modified - barriers to adherence to treatment plan identified - blood glucose monitoring encouraged - blood glucose readings reviewed - individualized medical nutrition therapy provided - mutual A1C goal set or reviewed - self-awareness of signs/symptoms  of hypo or hyperglycemia encouraged - use of blood glucose monitoring log promoted Follow Up Plan: Telephone follow up appointment with care management team member scheduled for: 11-08-2020 at 1 pm   Task: RNCM: Alleviate Barriers to Glycemic Management   Note:   Care Management Activities:    - barriers to adherence to treatment plan identified - blood glucose monitoring encouraged - blood glucose readings reviewed - individualized medical nutrition therapy provided - mutual A1C goal set or reviewed - self-awareness of signs/symptoms of hypo or hyperglycemia encouraged - use of blood glucose monitoring log promoted         Patient verbalizes understanding of instructions provided today and agrees to view in [place].   Telephone follow up appointment with care management team member scheduled for: 11-08-2020 at 1 pm  Noreene Larsson RN, MSN, [place] [place] Mobile: [phone]

## 2020-09-22 ENCOUNTER — Observation Stay: Payer: Medicare Other | Admitting: Anesthesiology

## 2020-09-22 ENCOUNTER — Observation Stay
Admission: EM | Admit: 2020-09-22 | Discharge: 2020-09-23 | Disposition: A | Discharge status: Home / Self Care | Payer: Medicare Other | Attending: Internal Medicine | Admitting: Internal Medicine

## 2020-09-22 ENCOUNTER — Observation Stay: Payer: Medicare Other

## 2020-09-22 ENCOUNTER — Encounter: Payer: Self-pay | Admitting: Emergency Medicine

## 2020-09-22 ENCOUNTER — Emergency Department: Payer: Medicare Other

## 2020-09-22 ENCOUNTER — Other Ambulatory Visit: Payer: Self-pay

## 2020-09-22 ENCOUNTER — Encounter
Admission: EM | Disposition: A | Discharge status: Home / Self Care | Payer: Self-pay | Source: Home / Self Care | Attending: Emergency Medicine

## 2020-09-22 DIAGNOSIS — E1169 Type 2 diabetes mellitus with other specified complication: Secondary | ICD-10-CM

## 2020-09-22 DIAGNOSIS — N132 Hydronephrosis with renal and ureteral calculous obstruction: Secondary | ICD-10-CM | POA: Diagnosis not present

## 2020-09-22 DIAGNOSIS — Z20822 Contact with and (suspected) exposure to covid-19: Secondary | ICD-10-CM | POA: Insufficient documentation

## 2020-09-22 DIAGNOSIS — R1032 Left lower quadrant pain: Secondary | ICD-10-CM | POA: Diagnosis present

## 2020-09-22 DIAGNOSIS — R600 Localized edema: Secondary | ICD-10-CM | POA: Diagnosis not present

## 2020-09-22 DIAGNOSIS — Z79899 Other long term (current) drug therapy: Secondary | ICD-10-CM | POA: Insufficient documentation

## 2020-09-22 DIAGNOSIS — N201 Calculus of ureter: Secondary | ICD-10-CM

## 2020-09-22 DIAGNOSIS — E119 Type 2 diabetes mellitus without complications: Secondary | ICD-10-CM

## 2020-09-22 DIAGNOSIS — Z7982 Long term (current) use of aspirin: Secondary | ICD-10-CM | POA: Diagnosis not present

## 2020-09-22 DIAGNOSIS — Z7984 Long term (current) use of oral hypoglycemic drugs: Secondary | ICD-10-CM | POA: Insufficient documentation

## 2020-09-22 DIAGNOSIS — N049 Nephrotic syndrome with unspecified morphologic changes: Secondary | ICD-10-CM | POA: Diagnosis not present

## 2020-09-22 DIAGNOSIS — Z9049 Acquired absence of other specified parts of digestive tract: Secondary | ICD-10-CM | POA: Diagnosis not present

## 2020-09-22 DIAGNOSIS — I10 Essential (primary) hypertension: Secondary | ICD-10-CM | POA: Diagnosis not present

## 2020-09-22 DIAGNOSIS — N390 Urinary tract infection, site not specified: Secondary | ICD-10-CM | POA: Diagnosis present

## 2020-09-22 DIAGNOSIS — N1330 Unspecified hydronephrosis: Secondary | ICD-10-CM | POA: Diagnosis not present

## 2020-09-22 HISTORY — PX: CYSTOSCOPY/URETEROSCOPY/HOLMIUM LASER/STENT PLACEMENT: SHX6546

## 2020-09-22 LAB — URINALYSIS, COMPLETE (UACMP) WITH MICROSCOPIC
Bilirubin Urine: NEGATIVE
Glucose, UA: >=500 mg/dL — AB
Ketones, ur: NEGATIVE mg/dL
Nitrite: POSITIVE — AB
Protein, ur: 30 mg/dL — AB
Specific Gravity, Urine: 1.015 (ref 1.005–1.030)
WBC, UA: >50 WBC/hpf — ABNORMAL HIGH (ref 0–5)
pH: 5 (ref 5.0–8.0)

## 2020-09-22 LAB — COMPREHENSIVE METABOLIC PANEL
ALT: 10 U/L (ref 0–44)
AST: 25 U/L (ref 15–41)
Albumin: 4.1 g/dL (ref 3.5–5.0)
Alkaline Phosphatase: 105 U/L (ref 38–126)
Anion gap: 9 (ref 5–15)
BUN: 19 mg/dL (ref 8–23)
CO2: 21 mmol/L — ABNORMAL LOW (ref 22–32)
Calcium: 9.1 mg/dL (ref 8.9–10.3)
Chloride: 103 mmol/L (ref 98–111)
Creatinine, Ser: 0.9 mg/dL (ref 0.44–1.00)
GFR, Estimated: >60 mL/min (ref >60)
Glucose, Bld: 212 mg/dL — ABNORMAL HIGH (ref 70–99)
Potassium: 4.4 mmol/L (ref 3.5–5.1)
Sodium: 133 mmol/L — ABNORMAL LOW (ref 135–145)
Total Bilirubin: 0.7 mg/dL (ref 0.3–1.2)
Total Protein: 7.2 g/dL (ref 6.5–8.1)

## 2020-09-22 LAB — CBC
HCT: 35.9 % — ABNORMAL LOW (ref 36.0–46.0)
Hemoglobin: 12.7 g/dL (ref 12.0–15.0)
MCH: 29.8 pg (ref 26.0–34.0)
MCHC: 35.4 g/dL (ref 30.0–36.0)
MCV: 84.3 fL (ref 80.0–100.0)
Platelets: 267 10*3/uL (ref 150–400)
RBC: 4.26 MIL/uL (ref 3.87–5.11)
RDW: 12.3 % (ref 11.5–15.5)
WBC: 10.3 10*3/uL (ref 4.0–10.5)
nRBC: 0 % (ref 0.0–0.2)

## 2020-09-22 LAB — CBG MONITORING, ED: Glucose-Capillary: 145 mg/dL — ABNORMAL HIGH (ref 70–99)

## 2020-09-22 LAB — GLUCOSE, CAPILLARY
Glucose-Capillary: 156 mg/dL — ABNORMAL HIGH (ref 70–99)
Glucose-Capillary: 208 mg/dL — ABNORMAL HIGH (ref 70–99)

## 2020-09-22 LAB — LIPASE, BLOOD: Lipase: 29 U/L (ref 11–51)

## 2020-09-22 SURGERY — CYSTOSCOPY/URETEROSCOPY/HOLMIUM LASER/STENT PLACEMENT
Anesthesia: General | Site: Ureter | Laterality: Left

## 2020-09-22 MED ORDER — OMEGA-3-ACID ETHYL ESTERS 1 G PO CAPS
1.0000 g | ORAL_CAPSULE | Freq: Every day | ORAL | Status: DC
  Administered 2020-09-23: 1 g via ORAL
  Filled 2020-09-22 (×2): qty 1

## 2020-09-22 MED ORDER — SODIUM CHLORIDE 0.9 % IV BOLUS
500.0000 mL | Freq: Once | INTRAVENOUS | Status: AC
  Administered 2020-09-22: 500 mL via INTRAVENOUS

## 2020-09-22 MED ORDER — LACTATED RINGERS IV SOLN: INTRAVENOUS | Status: DC | PRN

## 2020-09-22 MED ORDER — KETOROLAC TROMETHAMINE 30 MG/ML IJ SOLN: 30.0000 mg | Freq: Four times a day (QID) | INTRAMUSCULAR | Status: DC | PRN

## 2020-09-22 MED ORDER — DEXAMETHASONE SODIUM PHOSPHATE 10 MG/ML IJ SOLN
INTRAMUSCULAR | Status: DC | PRN
  Administered 2020-09-22: 4 mg via INTRAVENOUS

## 2020-09-22 MED ORDER — MIDAZOLAM HCL 2 MG/2ML IJ SOLN
INTRAMUSCULAR | Status: DC | PRN
  Administered 2020-09-22: 2 mg via INTRAVENOUS

## 2020-09-22 MED ORDER — CEFAZOLIN SODIUM-DEXTROSE 1-4 GM/50ML-% IV SOLN
INTRAVENOUS | Status: DC | PRN
  Administered 2020-09-22: 1 g via INTRAVENOUS

## 2020-09-22 MED ORDER — LORATADINE 10 MG PO TABS
10.0000 mg | ORAL_TABLET | Freq: Every day | ORAL | Status: DC
  Administered 2020-09-23: 10 mg via ORAL
  Filled 2020-09-22: qty 1

## 2020-09-22 MED ORDER — HYDRALAZINE HCL 20 MG/ML IJ SOLN: 10.0000 mg | Freq: Four times a day (QID) | INTRAMUSCULAR | Status: DC | PRN

## 2020-09-22 MED ORDER — FENTANYL CITRATE (PF) 100 MCG/2ML IJ SOLN
25.0000 ug | INTRAMUSCULAR | Status: DC | PRN
Start: 2020-09-22 — End: 2020-09-22

## 2020-09-22 MED ORDER — SODIUM CHLORIDE 0.9 % IV SOLN: INTRAVENOUS | Status: DC

## 2020-09-22 MED ORDER — FENTANYL CITRATE (PF) 100 MCG/2ML IJ SOLN
50.0000 ug | Freq: Once | INTRAMUSCULAR | Status: AC
  Administered 2020-09-22: 50 ug via INTRAVENOUS
  Filled 2020-09-22: qty 2

## 2020-09-22 MED ORDER — SUCCINYLCHOLINE CHLORIDE 20 MG/ML IJ SOLN
INTRAMUSCULAR | Status: DC | PRN
  Administered 2020-09-22: 140 mg via INTRAVENOUS

## 2020-09-22 MED ORDER — SUGAMMADEX SODIUM 200 MG/2ML IV SOLN
INTRAVENOUS | Status: DC | PRN
  Administered 2020-09-22: 300 mg via INTRAVENOUS

## 2020-09-22 MED ORDER — NAPROXEN 500 MG PO TABS: 500.0000 mg | ORAL_TABLET | Freq: Two times a day (BID) | ORAL | 2 refills | Status: DC

## 2020-09-22 MED ORDER — FLUTICASONE PROPIONATE 50 MCG/ACT NA SUSP
2.0000 | Freq: Every day | NASAL | Status: DC
  Filled 2020-09-22: qty 16

## 2020-09-22 MED ORDER — SODIUM CHLORIDE 0.9 % IV SOLN
1.0000 g | INTRAVENOUS | Status: DC
  Administered 2020-09-23: 1 g via INTRAVENOUS
  Filled 2020-09-22 (×2): qty 10

## 2020-09-22 MED ORDER — ONDANSETRON HCL 4 MG/2ML IJ SOLN
4.0000 mg | Freq: Four times a day (QID) | INTRAMUSCULAR | Status: DC | PRN
  Administered 2020-09-22: 4 mg via INTRAVENOUS
  Filled 2020-09-22: qty 2

## 2020-09-22 MED ORDER — FENTANYL CITRATE (PF) 100 MCG/2ML IJ SOLN
INTRAMUSCULAR | Status: DC | PRN
  Administered 2020-09-22 (×2): 25 ug via INTRAVENOUS

## 2020-09-22 MED ORDER — LOSARTAN POTASSIUM 50 MG PO TABS: 50.0000 mg | ORAL_TABLET | Freq: Every day | ORAL | Status: DC

## 2020-09-22 MED ORDER — ONDANSETRON HCL 4 MG/2ML IJ SOLN
INTRAMUSCULAR | Status: DC | PRN
  Administered 2020-09-22: 4 mg via INTRAVENOUS

## 2020-09-22 MED ORDER — ROSUVASTATIN CALCIUM 10 MG PO TABS
10.0000 mg | ORAL_TABLET | Freq: Every day | ORAL | Status: DC
  Administered 2020-09-23: 10 mg via ORAL
  Filled 2020-09-22 (×2): qty 1

## 2020-09-22 MED ORDER — PROMETHAZINE HCL 25 MG/ML IJ SOLN: 12.5000 mg | Freq: Once | INTRAMUSCULAR | Status: DC | PRN

## 2020-09-22 MED ORDER — DOCUSATE SODIUM 100 MG PO CAPS: 100.0000 mg | ORAL_CAPSULE | Freq: Two times a day (BID) | ORAL | 0 refills | Status: AC

## 2020-09-22 MED ORDER — IOHEXOL 180 MG/ML  SOLN
INTRAMUSCULAR | Status: DC | PRN
  Administered 2020-09-22: 5 mL

## 2020-09-22 MED ORDER — PANTOPRAZOLE SODIUM 40 MG IV SOLR
40.0000 mg | INTRAVENOUS | Status: DC
  Administered 2020-09-22: 40 mg via INTRAVENOUS
  Filled 2020-09-22: qty 40

## 2020-09-22 MED ORDER — ROCURONIUM BROMIDE 100 MG/10ML IV SOLN
INTRAVENOUS | Status: DC | PRN
  Administered 2020-09-22: 20 mg via INTRAVENOUS

## 2020-09-22 MED ORDER — ONDANSETRON HCL 4 MG PO TABS: 4.0000 mg | ORAL_TABLET | Freq: Four times a day (QID) | ORAL | Status: DC | PRN

## 2020-09-22 MED ORDER — ONDANSETRON HCL 4 MG/2ML IJ SOLN
4.0000 mg | Freq: Once | INTRAMUSCULAR | Status: AC
  Administered 2020-09-22: 4 mg via INTRAVENOUS
  Filled 2020-09-22: qty 2

## 2020-09-22 MED ORDER — ONDANSETRON 4 MG PO TBDP: 4.0000 mg | ORAL_TABLET | Freq: Three times a day (TID) | ORAL | 0 refills | Status: DC | PRN

## 2020-09-22 MED ORDER — KETOROLAC TROMETHAMINE 30 MG/ML IJ SOLN
15.0000 mg | Freq: Once | INTRAMUSCULAR | Status: AC
  Administered 2020-09-22: 15 mg via INTRAVENOUS
  Filled 2020-09-22: qty 1

## 2020-09-22 MED ORDER — PHENYLEPHRINE HCL (PRESSORS) 10 MG/ML IV SOLN
INTRAVENOUS | Status: DC | PRN
  Administered 2020-09-22: 100 ug via INTRAVENOUS
  Administered 2020-09-22: 200 ug via INTRAVENOUS
  Administered 2020-09-22: 150 ug via INTRAVENOUS

## 2020-09-22 MED ORDER — PROPOFOL 10 MG/ML IV BOLUS
INTRAVENOUS | Status: DC | PRN
  Administered 2020-09-22: 150 mg via INTRAVENOUS

## 2020-09-22 MED ORDER — FENTANYL CITRATE (PF) 100 MCG/2ML IJ SOLN
INTRAMUSCULAR | Status: AC
  Filled 2020-09-22: qty 2

## 2020-09-22 MED ORDER — TAMSULOSIN HCL 0.4 MG PO CAPS: 0.4000 mg | ORAL_CAPSULE | Freq: Every day | ORAL | 0 refills | Status: DC

## 2020-09-22 MED ORDER — MIDAZOLAM HCL 2 MG/2ML IJ SOLN
INTRAMUSCULAR | Status: AC
  Filled 2020-09-22: qty 2

## 2020-09-22 MED ORDER — SODIUM CHLORIDE 0.9 % IV SOLN
1.0000 g | Freq: Once | INTRAVENOUS | Status: AC
  Administered 2020-09-22: 1 g via INTRAVENOUS
  Filled 2020-09-22: qty 10

## 2020-09-22 MED ORDER — OXYCODONE-ACETAMINOPHEN 5-325 MG PO TABS: 1.0000 | ORAL_TABLET | ORAL | 0 refills | Status: DC | PRN

## 2020-09-22 SURGICAL SUPPLY — 21 items
BAG DRAIN CYSTO-URO LG1000N (MISCELLANEOUS) ×2 IMPLANT
BASKET ZERO TIP 1.9FR (BASKET) ×2 IMPLANT
CATH FOL LX CONE TIP  8F (CATHETERS) ×1
CATH FOL LX CONE TIP 8F (CATHETERS) ×1 IMPLANT
CATH URET FLEX-TIP 2 LUMEN 10F (CATHETERS) ×2 IMPLANT
CATH URETL 5X70 OPEN END (CATHETERS) ×2 IMPLANT
FEE TECHNICIAN ONLY PER HOUR (MISCELLANEOUS) ×2 IMPLANT
GLOVE SURG ENC MOIS LTX SZ7 (GLOVE) ×4 IMPLANT
GLOVE SURG ENC MOIS LTX SZ7.5 (GLOVE) ×2 IMPLANT
GOWN STRL REUS W/ TWL LRG LVL4 (GOWN DISPOSABLE) ×1 IMPLANT
GOWN STRL REUS W/TWL LRG LVL4 (GOWN DISPOSABLE) ×1
GOWN STRL REUS W/TWL XL LVL4 (GOWN DISPOSABLE) ×2 IMPLANT
GUIDEWIRE ANG ZIPWIRE 035X150 (WIRE) ×2 IMPLANT
GUIDEWIRE STR DUAL SENSOR (WIRE) ×2 IMPLANT
HOLMIUM LASER 550 FIBER (Laser) ×2 IMPLANT
KIT TURNOVER CYSTO (KITS) ×2 IMPLANT
MANIFOLD NEPTUNE II (INSTRUMENTS) ×2 IMPLANT
PACK CYSTO AR (MISCELLANEOUS) ×2 IMPLANT
SET CYSTO W/LG BORE CLAMP LF (SET/KITS/TRAYS/PACK) ×2 IMPLANT
TRACTIP FLEXIVA PULSE ID 200 (Laser) ×2 IMPLANT
WIRE G XSTIFF 025X145 (WIRE) ×2 IMPLANT

## 2020-09-22 NOTE — ED Notes (Signed)
Patient c/o returning nausea and pain. Dr. Corky Downs alerted and to bedside to reassess patient.

## 2020-09-22 NOTE — H&P (Signed)
History and Physical    Analynn Daum ZOX:[id number] DOB: June 28, 1950 DOA: 09/22/2020  PCP: Olin Hauser, DO   Patient coming from: Home  I have personally briefly reviewed patient's old medical records in [place]  Chief Complaint: Abdominal pain  HPI: Tanisa Lagace is a 70 y.o. female with medical history significant for diabetes mellitus on oral agents, hypertension, dyslipidemia and nephrolithiasis who presents to the ER for evaluation of left lower quadrant pain that woke her up from sleep at about 5 AM.  It initially started as a dull ache and was rated a 5 x 10 in intensity at its worst but has progressively worsened over the course of the day and she rated an 8 x 10 in intensity at its worst now.  Pain is associated with nausea and multiple episodes of emesis. She has frequency of urination but denies having any dysuria.  She denies having any fever or chills. She denies having any chest pain, no shortness of breath, no headache, no dizziness, no lightheadedness, no changes in her bowel habits, no cough, no blurred vision, no focal deficits. Show sodium 133, potassium 4.4, chloride 103, bicarb 21, glucose 212, BUN 19, creatinine 0.90, calcium 9.1, alkaline phosphatase 105, albumin 4.2, lipase 29, AST 25, ALT 10, total bilirubin 0.7, white count 10.3, hemoglobin 12.7, hematocrit 35.9, MCV 84.3, RDW 12.3, platelet count 267 Respiratory viral panel is pending Urinalysis shows pyuria Renal stone CT shows obstructing calculus in the distal LEFT ureter. LEFT hydronephrosis, renal edema and perinephric stranding.Several additional nonobstructing renal calculi.   ED Course: Patient is a 70 year old Caucasian female with a history of nephrolithiasis, hypertension, diabetes mellitus and dyslipidemia who presents to the ER for evaluation of sudden onset left lower quadrant pain associated with nausea and multiple episodes of vomiting. Imaging shows an obstructing calculus in  the distal left ureter with hydronephrosis.  She also has pyuria.  Patient will be referred to observation status for further evaluation. Review of Systems: As per HPI otherwise all other systems reviewed and negative.    Past Medical History:  Diagnosis Date  . Allergy   . Arthritis    right knee  . Diabetes mellitus, type 2 ([place])   . Hypertension   . Urinary incontinence   . Wears dentures    full upper and lower    Past Surgical History:  Procedure Laterality Date  . APPENDECTOMY    . CATARACT EXTRACTION W/PHACO Left 08/08/2019   Procedure: CATARACT EXTRACTION PHACO AND INTRAOCULAR LENS PLACEMENT (IOC) LEFT 4.59  00:32.3;  Surgeon: Eulogio Bear, MD;  Location: [place];  Service: Ophthalmology;  Laterality: Left;  Diabetic - oral meds  . CATARACT EXTRACTION W/PHACO Right 09/05/2019   Procedure: CATARACT EXTRACTION PHACO AND INTRAOCULAR LENS PLACEMENT (IOC) RIGHT DIABETIC 4.65  00:34.2;  Surgeon: Eulogio Bear, MD;  Location: [facility];  Service: Ophthalmology;  Laterality: Right;  Diabetic - oral meds  . CHOLECYSTECTOMY  1993  . TONSILLECTOMY       reports that she has never smoked. She has never used smokeless tobacco. She reports that she does not drink alcohol and does not use drugs.  Allergies  Allergen Reactions  . Codeine Nausea And Vomiting and Other (See Comments)    Nausea vomiting,  Muscle weakness  . Lisinopril Cough    ACEi-cough    Family History  Problem Relation Age of Onset  . Heart failure Mother   . Heart disease Mother   . Diabetes Mother   .  Heart attack Mother   . Cancer Father        lung  . Heart attack Sister   . Diabetes Sister   . Breast cancer Half-Sister       Prior to Admission medications   Medication Sig Start Date End Date Taking? Authorizing Provider  docusate sodium (COLACE) 100 MG capsule Take 1 capsule (100 mg total) by mouth 2 (two) times daily for 15 days. 09/22/20 10/07/20 Yes Lavonia Drafts, MD   naproxen (NAPROSYN) 500 MG tablet Take 1 tablet (500 mg total) by mouth 2 (two) times daily with a meal. 09/22/20  Yes Lavonia Drafts, MD  ondansetron (ZOFRAN ODT) 4 MG disintegrating tablet Take 1 tablet (4 mg total) by mouth every 8 (eight) hours as needed. 09/22/20  Yes Lavonia Drafts, MD  oxyCODONE-acetaminophen (PERCOCET) 5-325 MG tablet Take 1 tablet by mouth every 4 (four) hours as needed for severe pain. 09/22/20 09/22/21 Yes Lavonia Drafts, MD  tamsulosin (FLOMAX) 0.4 MG CAPS capsule Take 1 capsule (0.4 mg total) by mouth daily. 09/22/20  Yes Lavonia Drafts, MD  aspirin EC 81 MG tablet Take 1 tablet (81 mg total) by mouth daily. 10/17/16   Karamalegos, Devonne Doughty, DO  Blood Glucose Monitoring Suppl (ONE TOUCH ULTRA 2) w/Device KIT 1 device for checking blood sugar once daily 10/17/16   Karamalegos, Alexander J, DO  fluticasone (FLONASE) 50 MCG/ACT nasal spray Place 2 sprays into both nostrils daily. 01/20/17   Karamalegos, Devonne Doughty, DO  glucose blood (ONE TOUCH ULTRA TEST) test strip CHECK BLOOD SUGAR ONCE DAILY 10/07/17   Parks Ranger, Devonne Doughty, DO  Krill Oil 350 MG CAPS Take 350 mg by mouth daily.    [provider]  Lancets [facility] ULTRASOFT) lancets Use as instructed 10/17/16   Olin Hauser, DO  loratadine (CLARITIN) 10 MG tablet Take 1 tablet (10 mg total) by mouth daily. 01/20/17   Karamalegos, Devonne Doughty, DO  losartan (COZAAR) 50 MG tablet TAKE 1 TABLET BY MOUTH  DAILY 07/01/20   Parks Ranger, Devonne Doughty, DO  metFORMIN (GLUCOPHAGE) 500 MG tablet TAKE 1 TABLET BY MOUTH  TWICE DAILY WITH A MEAL 06/13/20   Karamalegos, Devonne Doughty, DO  rosuvastatin (CRESTOR) 10 MG tablet TAKE 1 TABLET BY MOUTH DAILY 06/12/20   Parks Ranger, Devonne Doughty, DO  [facility] injection Inject 0.5 mL into muscle for shingles vaccine. Repeat dose in 2-6 months. 08/03/20   Olin Hauser, DO    Physical Exam: Vitals:   09/22/20 1245 09/22/20 1400 09/22/20 1428 09/22/20 1445  BP: (!) 172/67  (!) 160/70 (!) 156/70   Pulse: 72 70 80   Resp: '16 16 16   ' Temp:    98.5 F (36.9 C)  TempSrc:    Oral  SpO2: 96% 97% 98%   Weight:      Height:         Vitals:   09/22/20 1245 09/22/20 1400 09/22/20 1428 09/22/20 1445  BP: (!) 172/67 (!) 160/70 (!) 156/70   Pulse: 72 70 80   Resp: '16 16 16   ' Temp:    98.5 F (36.9 C)  TempSrc:    Oral  SpO2: 96% 97% 98%   Weight:      Height:          Constitutional: Alert and oriented x 3 .  Appears to be uncomfortable and in moderate painful distress  HEENT:      Head: Normocephalic and atraumatic.         Eyes: PERLA, EOMI,  Conjunctivae are normal. Sclera is non-icteric.       Mouth/Throat: Mucous membranes are moist.       Neck: Supple with no signs of meningismus. Cardiovascular: Regular rate and rhythm. No murmurs, gallops, or rubs. 2+ symmetrical distal pulses are present . No JVD. No LE edema Respiratory: Respiratory effort normal .Lungs sounds clear bilaterally. No wheezes, crackles, or rhonchi.  Gastrointestinal: Soft,  tender left lower quadrant, and non distended with positive bowel sounds.  Genitourinary: No CVA tenderness. Musculoskeletal: Nontender with normal range of motion in all extremities. No cyanosis, or erythema of extremities. Neurologic:  Face is symmetric. Moving all extremities. No gross focal neurologic deficits . Skin: Skin is warm, dry.  No rash or ulcers Psychiatric: Mood and affect are normal   Labs on Admission: I have personally reviewed following labs and imaging studies  CBC: Recent Labs  Lab 09/22/20 0931  WBC 10.3  HGB 12.7  HCT 35.9*  MCV 84.3  PLT 034   Basic Metabolic Panel: Recent Labs  Lab 09/22/20 0931  NA 133*  K 4.4  CL 103  CO2 21*  GLUCOSE 212*  BUN 19  CREATININE 0.90  CALCIUM 9.1   GFR: Estimated Creatinine Clearance: 54.2 mL/min (by C-G formula based on SCr of 0.9 mg/dL). Liver Function Tests: Recent Labs  Lab 09/22/20 0931  AST 25  ALT 10  ALKPHOS 105   BILITOT 0.7  PROT 7.2  ALBUMIN 4.1   Recent Labs  Lab 09/22/20 0931  LIPASE 29   No results for input(s): AMMONIA in the last 168 hours. Coagulation Profile: No results for input(s): INR, PROTIME in the last 168 hours. Cardiac Enzymes: No results for input(s): CKTOTAL, CKMB, CKMBINDEX, TROPONINI in the last 168 hours. BNP (last 3 results) No results for input(s): PROBNP in the last 8760 hours. HbA1C: No results for input(s): HGBA1C in the last 72 hours. CBG: No results for input(s): GLUCAP in the last 168 hours. Lipid Profile: No results for input(s): CHOL, HDL, LDLCALC, TRIG, CHOLHDL, LDLDIRECT in the last 72 hours. Thyroid Function Tests: No results for input(s): TSH, T4TOTAL, FREET4, T3FREE, THYROIDAB in the last 72 hours. Anemia Panel: No results for input(s): VITAMINB12, FOLATE, FERRITIN, TIBC, IRON, RETICCTPCT in the last 72 hours. Urine analysis:    Component Value Date/Time   COLORURINE YELLOW (A) 09/22/2020 1035   APPEARANCEUR CLOUDY (A) 09/22/2020 1035   APPEARANCEUR Hazy (A) 04/20/2019 1503   LABSPEC 1.015 09/22/2020 1035   PHURINE 5.0 09/22/2020 1035   GLUCOSEU >=500 (A) 09/22/2020 1035   HGBUR MODERATE (A) 09/22/2020 1035   BILIRUBINUR NEGATIVE 09/22/2020 1035   BILIRUBINUR Negative 04/20/2019 1503   KETONESUR NEGATIVE 09/22/2020 1035   PROTEINUR 30 (A) 09/22/2020 1035   NITRITE POSITIVE (A) 09/22/2020 1035   LEUKOCYTESUR LARGE (A) 09/22/2020 1035    Radiological Exams on Admission: CT Renal Stone Study  Result Date: 09/22/2020 CLINICAL DATA:  Flank pain.  Kidney stone suspected EXAM: CT ABDOMEN AND PELVIS WITHOUT CONTRAST TECHNIQUE: Multidetector CT imaging of the abdomen and pelvis was performed following the standard protocol without IV contrast. COMPARISON:  CT 03/30/2019 FINDINGS: Lower chest: Lung bases are clear. Hepatobiliary: No focal hepatic lesion. Postcholecystectomy. No biliary dilatation. Pancreas: No pancreatic inflammation Spleen: Normal  spleen Adrenals/urinary tract: Adrenal glands are normal. There is LEFT renal edema and perinephric stranding. Mild hydronephrosis and hydroureter of the LEFT renal collecting system. There is an obstructing calculus in the distal LEFT ureter measuring 4 mm on image 76/2. This calculus is  approximately 2 cm from the LEFT vesicoureteral junction. 3 mm nonobstructing calculus in lower pole of the LEFT kidney. Small nonobstructing 1 mm calculus in the RIGHT kidney. RIGHT renal cortical scarring. No RIGHT ureterolithiasis. No bladder calculi. Stomach/Bowel: Stomach, small bowel, appendix, and cecum are normal. The colon and rectosigmoid colon are normal. Vascular/Lymphatic: Abdominal aorta is normal caliber. No periportal or retroperitoneal adenopathy. No pelvic adenopathy. Reproductive: Uterus and adnexa unremarkable. Other: No free fluid. Musculoskeletal: No acute osseous abnormality. IMPRESSION: 1. Obstructing calculus in the distal LEFT ureter. LEFT hydronephrosis, renal edema and perinephric stranding. 2. Several additional nonobstructing renal calculi. Electronically Signed   By: Suzy Bouchard M.D.   On: 09/22/2020 09:59     Assessment/Plan Principal Problem:   Hydronephrosis with obstructing calculus Active Problems:   Diabetes mellitus, type 2 ([place])   Essential hypertension   Acute lower UTI     Left-sided hydronephrosis or obstructing calculus Patient has a history of nephrolithiasis Pain control with IV Toradol Supportive care with antiemetics We will request urology consult    Urinary tract infection Patient has pyuria Start patient empirically on IV antibiotic therapy and with Rocephin 1 g IV daily while awaiting results of urine culture    Diabetes mellitus Hold metformin Blood sugar checks every 4 hours    Hypertension Hold losartan for now Hydralazine 10 mg IV every 6 hours for systolic blood pressure greater than 131mHg   DVT prophylaxis: SCD Code Status: full  code Family Communication: Greater than 50% of time was spent discussing plan of care with patient at the bedside.  All questions and concerns have been addressed.  She verbalizes understanding and agrees to the plan. Disposition Plan: Back to previous home environment Consults called: Urology Status: Observation    Larisa Lanius MD [facility]     09/22/2020, 2:56 PM

## 2020-09-22 NOTE — ED Provider Notes (Addendum)
[facility] Emergency Department Provider Note   ____________________________________________    I have reviewed the triage vital signs and the nursing notes.   HISTORY  Chief Complaint Abdominal Pain     HPI Andrea Dodson is a 70 y.o. female who presents with complaints of left lower quadrant pain.  Patient reports this pain started approximately 5 AM this morning.  She reports the pain is sharp and moderate to severe in intensity.  She denies dysuria or hematuria.  Does have a history of kidney stones.  Positive nausea.  No fevers or chills.  Has not take anything for this  Past Medical History:  Diagnosis Date  . Allergy   . Arthritis    right knee  . Diabetes mellitus, type 2 ([place])   . Hypertension   . Urinary incontinence   . Wears dentures    full upper and lower    Patient Active Problem List   Diagnosis Date Noted  . Salivary gland swelling 02/11/2019  . Environmental and seasonal allergies 08/12/2018  . Hyperlipidemia associated with type 2 diabetes mellitus ([place]) 01/20/2017  . Overweight (BMI 25.0-29.9) 07/11/2016  . Type 2 diabetes mellitus with other specified complication ([place]) 26/94/8546  . Essential hypertension 07/11/2016  . Colon cancer screening 07/11/2016  . H/O seasonal allergies 07/08/2016  . Osteoarthritis of right knee 07/08/2016  . Urinary incontinence 07/08/2016    Past Surgical History:  Procedure Laterality Date  . APPENDECTOMY    . CATARACT EXTRACTION W/PHACO Left 08/08/2019   Procedure: CATARACT EXTRACTION PHACO AND INTRAOCULAR LENS PLACEMENT (IOC) LEFT 4.59  00:32.3;  Surgeon: Eulogio Bear, MD;  Location: [place];  Service: Ophthalmology;  Laterality: Left;  Diabetic - oral meds  . CATARACT EXTRACTION W/PHACO Right 09/05/2019   Procedure: CATARACT EXTRACTION PHACO AND INTRAOCULAR LENS PLACEMENT (IOC) RIGHT DIABETIC 4.65  00:34.2;  Surgeon: Eulogio Bear, MD;  Location: [place];  Service: Ophthalmology;  Laterality: Right;  Diabetic - oral meds  . CHOLECYSTECTOMY  1993  . TONSILLECTOMY      Prior to Admission medications   Medication Sig Start Date End Date Taking? Authorizing Provider  docusate sodium (COLACE) 100 MG capsule Take 1 capsule (100 mg total) by mouth 2 (two) times daily for 15 days. 09/22/20 10/07/20 Yes Lavonia Drafts, MD  naproxen (NAPROSYN) 500 MG tablet Take 1 tablet (500 mg total) by mouth 2 (two) times daily with a meal. 09/22/20  Yes Lavonia Drafts, MD  ondansetron (ZOFRAN ODT) 4 MG disintegrating tablet Take 1 tablet (4 mg total) by mouth every 8 (eight) hours as needed. 09/22/20  Yes Lavonia Drafts, MD  oxyCODONE-acetaminophen (PERCOCET) 5-325 MG tablet Take 1 tablet by mouth every 4 (four) hours as needed for severe pain. 09/22/20 09/22/21 Yes Lavonia Drafts, MD  tamsulosin (FLOMAX) 0.4 MG CAPS capsule Take 1 capsule (0.4 mg total) by mouth daily. 09/22/20  Yes Lavonia Drafts, MD  aspirin EC 81 MG tablet Take 1 tablet (81 mg total) by mouth daily. 10/17/16   Karamalegos, Devonne Doughty, DO  Blood Glucose Monitoring Suppl (ONE TOUCH ULTRA 2) w/Device KIT 1 device for checking blood sugar once daily 10/17/16   Karamalegos, Alexander J, DO  fluticasone (FLONASE) 50 MCG/ACT nasal spray Place 2 sprays into both nostrils daily. 01/20/17   Karamalegos, Devonne Doughty, DO  glucose blood (ONE TOUCH ULTRA TEST) test strip CHECK BLOOD SUGAR ONCE DAILY 10/07/17   Parks Ranger, Devonne Doughty, DO  Krill Oil 350 MG CAPS Take  350 mg by mouth daily.    [provider]  Lancets [facility] ULTRASOFT) lancets Use as instructed 10/17/16   Olin Hauser, DO  loratadine (CLARITIN) 10 MG tablet Take 1 tablet (10 mg total) by mouth daily. 01/20/17   Karamalegos, Devonne Doughty, DO  losartan (COZAAR) 50 MG tablet TAKE 1 TABLET BY MOUTH  DAILY 07/01/20   Parks Ranger, Devonne Doughty, DO  metFORMIN (GLUCOPHAGE) 500 MG tablet TAKE 1 TABLET BY MOUTH  TWICE DAILY WITH A MEAL 06/13/20    Karamalegos, Devonne Doughty, DO  rosuvastatin (CRESTOR) 10 MG tablet TAKE 1 TABLET BY MOUTH DAILY 06/12/20   Parks Ranger, Devonne Doughty, DO  [facility] injection Inject 0.5 mL into muscle for shingles vaccine. Repeat dose in 2-6 months. 08/03/20   Karamalegos, Devonne Doughty, DO     Allergies Codeine and Lisinopril  Family History  Problem Relation Age of Onset  . Heart failure Mother   . Heart disease Mother   . Diabetes Mother   . Heart attack Mother   . Cancer Father        lung  . Heart attack Sister   . Diabetes Sister   . Breast cancer Half-Sister     Social History Social History   Tobacco Use  . Smoking status: Never Smoker  . Smokeless tobacco: Never Used  Vaping Use  . Vaping Use: Never used  Substance Use Topics  . Alcohol use: No  . Drug use: No    Review of Systems  Constitutional: No fever/chills Eyes: No visual changes.  ENT: No sore throat. Cardiovascular: Denies chest pain. Respiratory: Denies shortness of breath. Gastrointestinal: As above.   Genitourinary: As above Musculoskeletal: Negative for back pain. Skin: Negative for rash. Neurological: Negative for headaches or weakness   ____________________________________________   PHYSICAL EXAM:  VITAL SIGNS: ED Triage Vitals  Enc Vitals Group     BP 09/22/20 0914 (!) 188/77     Pulse Rate 09/22/20 0914 65     Resp 09/22/20 0914 16     Temp 09/22/20 0914 98.1 F (36.7 C)     Temp Source 09/22/20 0914 Oral     SpO2 09/22/20 0914 98 %     Weight 09/22/20 0913 70.3 kg (154 lb 15.7 oz)     Height 09/22/20 0913 1.575 m (_0 )     Head Circumference --      Peak Flow --      Pain Score 09/22/20 0913 10     Pain Loc --      Pain Edu? --      Excl. in [place]? --     Constitutional: Alert and oriented.   Nose: No congestion/rhinnorhea. Mouth/Throat: Mucous membranes are moist.   Neck:  Painless ROM Cardiovascular: Normal rate, regular rhythm.  Good peripheral circulation. Respiratory: Normal  respiratory effort.  No retractions Gastrointestinal: Soft and nontender. No distention.  No CVA tenderness.  Musculoskeletal:  Warm and well perfused Neurologic:  Normal speech and language. No gross focal neurologic deficits are appreciated.  Skin:  Skin is warm, dry and intact. No rash noted. Psychiatric: Mood and affect are normal. Speech and behavior are normal.  ____________________________________________   LABS (all labs ordered are listed, but only abnormal results are displayed)  Labs Reviewed  CBC - Abnormal; Notable for the following components:      Result Value   HCT 35.9 (*)    All other components within normal limits  COMPREHENSIVE METABOLIC PANEL - Abnormal; Notable for the following components:  Sodium 133 (*)    CO2 21 (*)    Glucose, Bld 212 (*)    All other components within normal limits  URINALYSIS, COMPLETE (UACMP) WITH MICROSCOPIC - Abnormal; Notable for the following components:   Color, Urine YELLOW (*)    APPearance CLOUDY (*)    Glucose, UA >=500 (*)    Hgb urine dipstick MODERATE (*)    Protein, ur 30 (*)    Nitrite POSITIVE (*)    Leukocytes,Ua LARGE (*)    WBC, UA >50 (*)    Bacteria, UA MANY (*)    Non Squamous Epithelial PRESENT (*)    All other components within normal limits  LIPASE, BLOOD   ____________________________________________  EKG  None ____________________________________________  RADIOLOGY  CT renal stone study reviewed by me, left-sided calculus ____________________________________________   PROCEDURES  Procedure(s) performed: No  Procedures   Critical Care performed: No ____________________________________________   INITIAL IMPRESSION / ASSESSMENT AND PLAN / ED COURSE  Pertinent labs & imaging results that were available during my care of the patient were reviewed by me and considered in my medical decision making (see chart for details).  Patient presents with abrupt onset left lower quadrant/flank  pain highly suspicious for ureterolithiasis.  Will treat with IV Toradol, obtain labs urinalysis CT renal stone study and reevaluate. ----------------------------------------- 10:53 AM on 09/22/2020 -----------------------------------------   CT scan confirms 4 mm left stone, pending urinalysis.  Patient still with some discomfort, will give IV fentanyl.  ----------------------------------------- 12:01 PM on 09/22/2020 -----------------------------------------  Patient's urinalysis concerning for UTI, nitrite positive, discussed with Dr. Junious Silk of urology who notes that given afebrile, no white blood cell count, appropriate to treat with antibiotics .  Patient with no dysuria  Did give patient a dose of IV Rocephin here in the emergency department  ----------------------------------------- 2:14 PM on 09/22/2020 ----------------------------------------- Patient has received multiple doses of IV pain medications, she continues to have significant pain.  We will discussed with the hospitalist for admission     ____________________________________________   FINAL CLINICAL IMPRESSION(S) / ED DIAGNOSES  Final diagnoses:  Ureterolithiasis        Note:  This document was prepared using Dragon voice recognition software and may include unintentional dictation errors.   Lavonia Drafts, MD 09/22/20 1409    Lavonia Drafts, MD 09/22/20 1413    Lavonia Drafts, MD 09/22/20 1415

## 2020-09-22 NOTE — ED Triage Notes (Signed)
C/O LLQ abdominal pain since 0500.  Has history of kidney stones.  Vomiting with pain.

## 2020-09-22 NOTE — Anesthesia Preprocedure Evaluation (Addendum)
Anesthesia Evaluation    Airway Mallampati: II  TM Distance: <3 FB Neck ROM: Full    Dental  (+) Edentulous Upper, Edentulous Lower   Pulmonary           Cardiovascular hypertension,  Rhythm:Regular  hyperlipidemia   Neuro/Psych    GI/Hepatic   Endo/Other  diabetes  Renal/GU Renal disease (Kidney stone in left ureter causing infection and hydronephrosis)     Musculoskeletal  (+) Arthritis ,   Abdominal   Peds  Hematology   Anesthesia Other Findings   Reproductive/Obstetrics                            Anesthesia Physical Anesthesia Plan  ASA: II and emergent  Anesthesia Plan: General   Post-op Pain Management:    Induction: Intravenous  PONV Risk Score and Plan: 2 and Ondansetron  Airway Management Planned: Oral ETT  Additional Equipment:   Intra-op Plan:   Post-operative Plan: Extubation in OR  Informed Consent:   Plan Discussed with:   Anesthesia Plan Comments:         Anesthesia Quick Evaluation

## 2020-09-22 NOTE — ED Notes (Signed)
Pt given urine cup, pt is attempting to provide urine sample. Pt ambulatory to restroom without assistance.

## 2020-09-22 NOTE — Transfer of Care (Signed)
Immediate Anesthesia Transfer of Care Note  Patient: Andrea Dodson  Procedure(s) Performed: CYSTOSCOPY/URETEROSCOPY/HOLMIUM LASER/STENT PLACEMENT (Left Ureter)  Patient Location: PACU  Anesthesia Type:General  Level of Consciousness: sedated  Airway & Oxygen Therapy: Patient Spontanous Breathing and Patient connected to face mask oxygen  Post-op Assessment: Report given to RN and Post -op Vital signs reviewed and stable  Post vital signs: Reviewed and stable  Last Vitals:  Vitals Value Taken Time  BP 124/71 09/22/20 1704  Temp 36.5 C 09/22/20 1704  Pulse 74 09/22/20 1711  Resp 17 09/22/20 1711  SpO2 100 % 09/22/20 1711  Vitals shown include unvalidated device data.  Last Pain:  Vitals:   09/22/20 1704  TempSrc:   PainSc: Asleep         Complications: No complications documented.

## 2020-09-22 NOTE — ED Notes (Signed)
Pt c/o severe left flank pain that started this morning. Pt states that she is having vomiting as well. Pt has hx/o kidney stones. Pt states that pain started in her kidney area and has moved into her LLQ. Pt states pain is 10/10.

## 2020-09-22 NOTE — ED Notes (Signed)
OR tech arrived to take pat to OR.

## 2020-09-22 NOTE — Op Note (Signed)
Preoperative diagnosis: Left distal ureteral stone, intractable nausea and vomiting, left hydronephrosis  Postoperative diagnosis: Same  Procedure: Cystoscopy with left retrograde pyelogram, left ureteroscopy, laser lithotripsy, left ureteral stent placement  Surgeon: Junious Silk  Anesthesia: General  Indication for procedure: Ms. Tesmer is a 70 year old female with a history of stones.  She developed left flank and left lower quadrant pain earlier today and CT scan revealed a 3 to 4 mm left distal stone and upstream moderate hydronephrosis with perinephric stranding.  She continued to have nausea and pain after narcotics and Toradol.  She was brought for above procedure.  Findings: Cystourethroscopy was unremarkable.  No stone or foreign body in the bladder.  No mucosal lesion.  Left retrograde pyelogram-this outlined a single ureter single collecting system unit with a possible tiny distal filling defect and some mild proximal hydroureteronephrosis.  Left ureteroscopy-confirmed obstruction from 4 mm stone at left ureterovesical junction.  Stone was fragmented and fragments dropped in the bladder with a 0 tip basket.  Description of procedure: After consent was obtained patient brought to the operating room.  After adequate anesthesia she is placed lithotomy position and prepped and draped in the usual sterile fashion.  She was given an additional 1 g of Ancef.  Timeout was performed to confirm the patient and procedure.  The cystoscope was passed per urethra and the bladder inspected and the left ureteral orifice cannulated with a 5 [place] open-ended catheter and left retrograde injection of contrast was performed.  The stone was small and not obvious and therefore I passed a sensor wire which initially met some resistance and then passed proximally and then a good amount of old dark urine E flux consistent with obstruction from the stone.  I took a single channel semirigid ureteroscope passed this  into the left ureteral orifice where the stone was located at the UVJ.  There was some edema around it and it was quite wedged I placed a 0 tip basket but there was not enough room to open the basket and securely ensnare the stone and I also was not confident it passed through the intramural tunnel without significant resistance.  Therefore I passed a 200 m laser fiber and at a setting of 0.2 and 15 broke the stone up into several small pieces.  Now it was easy to manipulate the pieces out sequentially with a 0 tip basket and dropped them in the bladder.  A couple of the pieces went into the dilated distal ureter and these were removed sequentially.  Reinspection revealed there to be no other stone fragments and no injury.  I was able to gently pass the scope up into the mid to distal ureter.  Water flow was kept to a minimum and the bladder was kept empty.  The wire was then backloaded and a 624 cm stent advanced.  The wire was removed with a good coil seen in the kidney and a good coil in the bladder.  The bladder was drained and the scope removed.  String was taped to the patient.  She was awakened taken to cover him in stable condition.  Complications: None  Blood loss: Minimal  Specimens: None  Drains: 6 x 24 cm left ureteral stent with string  Disposition: Patient stable to PACU-I discussed the procedure, postop and follow-up with her sister in the waiting area.  I communicated with Dr. Marthenia Rolling regarding the same.

## 2020-09-22 NOTE — Anesthesia Postprocedure Evaluation (Signed)
Anesthesia Post Note  Patient: Andrea Dodson  Procedure(s) Performed: CYSTOSCOPY/URETEROSCOPY/HOLMIUM LASER/STENT PLACEMENT (Left Ureter)  Patient location during evaluation: Nursing Unit Anesthesia Type: General Level of consciousness: awake and alert Pain management: pain level controlled Vital Signs Assessment: post-procedure vital signs reviewed and stable Respiratory status: spontaneous breathing Cardiovascular status: blood pressure returned to baseline and stable Postop Assessment: adequate PO intake Anesthetic complications: no Comments: Pt awake and doing well.  No anesthetic issues;   No complications documented.   Last Vitals:  Vitals:   09/22/20 1800 09/22/20 2024  BP: (!) 141/72 (!) 135/57  Pulse: 78 67  Resp: 20 16  Temp: 37 C 37.1 C  SpO2: 93% 95%    Last Pain:  Vitals:   09/22/20 2024  TempSrc: Oral  PainSc:                  Harrie Foreman

## 2020-09-22 NOTE — Anesthesia Procedure Notes (Signed)
Procedure Name: Intubation Date/Time: 09/22/2020 3:59 PM Performed by: Justus Memory, CRNA Pre-anesthesia Checklist: Patient identified, Patient being monitored, Timeout performed, Emergency Drugs available and Suction available Patient Re-evaluated:Patient Re-evaluated prior to induction Oxygen Delivery Method: Circle system utilized Preoxygenation: Pre-oxygenation with 100% oxygen Induction Type: IV induction, Rapid sequence and Cricoid Pressure applied Laryngoscope Size: Mac, 3 and McGraph Grade View: Grade II Tube type: Oral Tube size: 7.0 mm Number of attempts: 1 Airway Equipment and Method: Stylet and Video-laryngoscopy Placement Confirmation: ETT inserted through vocal cords under direct vision,  positive ETCO2 and breath sounds checked- equal and bilateral Secured at: 21 cm Tube secured with: Tape Dental Injury: Teeth and Oropharynx as per pre-operative assessment  Difficulty Due To: Difficulty was anticipated, Difficult Airway- due to large tongue and Difficult Airway- due to anterior larynx Future Recommendations: Recommend- induction with short-acting agent, and alternative techniques readily available

## 2020-09-22 NOTE — Discharge Instructions (Signed)
Ureteral Stent Implantation, Care After This sheet gives you information about how to care for yourself after your procedure. Your health care provider may also give you more specific instructions. If you have problems or questions, contact your health care provider.  Remove the stent on Tuesday morning, September 25, 2020 as instructed by gently pulling the string  What can I expect after the procedure? After the procedure, it is common to have:  Nausea.  Mild pain when you urinate. You may feel this pain in your lower back or lower abdomen. The pain should stop within a few minutes after you urinate. This may last for up to 1 week.  A small amount of blood in your urine for several days. Follow these instructions at home: Medicines  Take over-the-counter and prescription medicines only as told by your health care provider.  If you were prescribed an antibiotic medicine, take it as told by your health care provider. Do not stop taking the antibiotic even if you start to feel better.  Do not drive for 24 hours if you were given a sedative during your procedure.  Ask your health care provider if the medicine prescribed to you requires you to avoid driving or using heavy machinery. Activity  Rest as told by your health care provider.  Avoid sitting for a long time without moving. Get up to take short walks every 1-2 hours. This is important to improve blood flow and breathing. Ask for help if you feel weak or unsteady.  Return to your normal activities as told by your health care provider. Ask your health care provider what activities are safe for you. General instructions  Watch for any blood in your urine. Call your health care provider if the amount of blood in your urine increases.  If you have a catheter: ? Follow instructions from your health care provider about taking care of your catheter and collection bag. ? Do not take baths, swim, or use a hot tub until your health care  provider approves. Ask your health care provider if you may take showers. You may only be allowed to take sponge baths.  Drink enough fluid to keep your urine pale yellow.  Do not use any products that contain nicotine or tobacco, such as cigarettes, e-cigarettes, and chewing tobacco. These can delay healing after surgery. If you need help quitting, ask your health care provider.  Keep all follow-up visits as told by your health care provider. This is important.   Contact a health care provider if:  You have pain that gets worse or does not get better with medicine, especially pain when you urinate.  You have difficulty urinating.  You feel nauseous or you vomit repeatedly during a period of more than 2 days after the procedure. Get help right away if:  Your urine is dark red or has blood clots in it.  You are leaking urine (have incontinence).  The end of the stent comes out of your urethra.  You cannot urinate.  You have sudden, sharp, or severe pain in your abdomen or lower back.  You have a fever.  You have swelling or pain in your legs.  You have difficulty breathing. Summary  After the procedure, it is common to have mild pain when you urinate that goes away within a few minutes after you urinate. This may last for up to 1 week.  Watch for any blood in your urine. Call your health care provider if the amount of blood in  your urine increases.  Take over-the-counter and prescription medicines only as told by your health care provider.  Drink enough fluid to keep your urine pale yellow. This information is not intended to replace advice given to you by your health care provider. Make sure you discuss any questions you have with your health care provider. Document Revised: 01/12/2018 Document Reviewed: 01/13/2018 Elsevier Patient Education  2021 [employer].

## 2020-09-22 NOTE — ED Notes (Signed)
Patient transported to CT 

## 2020-09-22 NOTE — Consult Note (Addendum)
Consultation: Left ureteral stone, hydronephrosis, intractable pain nausea and vomiting Requested by: Dr. Francia Greaves   History of Present Illness: Andrea Dodson is a 70 year old female with a history of kidney stone she passed a kidney stone in 2020 and saw Dr. Diamantina Providence. She presented today with acute onset of left flank pain and left lower quadrant pain since this morning.  CT scan of the abdomen and pelvis was obtained which showed moderate to severe left hydronephrosis and some stranding around the kidney down to a 3-4 mm left distal ureteral stone.  Despite given narcotics and ketorolac she continues to have intractable pain and vomiting.  Her UA shows many bacteria however she has no white count, she is afebrile.  She has no dysuria.  She has had no gross hematuria. She has not seen a stone pass today.    Past Medical History:  Diagnosis Date  . Allergy   . Arthritis    right knee  . Diabetes mellitus, type 2 ([place])   . Hypertension   . Urinary incontinence   . Wears dentures    full upper and lower   Past Surgical History:  Procedure Laterality Date  . APPENDECTOMY    . CATARACT EXTRACTION W/PHACO Left 08/08/2019   Procedure: CATARACT EXTRACTION PHACO AND INTRAOCULAR LENS PLACEMENT (IOC) LEFT 4.59  00:32.3;  Surgeon: Eulogio Bear, MD;  Location: [facility];  Service: Ophthalmology;  Laterality: Left;  Diabetic - oral meds  . CATARACT EXTRACTION W/PHACO Right 09/05/2019   Procedure: CATARACT EXTRACTION PHACO AND INTRAOCULAR LENS PLACEMENT (IOC) RIGHT DIABETIC 4.65  00:34.2;  Surgeon: Eulogio Bear, MD;  Location: [facility];  Service: Ophthalmology;  Laterality: Right;  Diabetic - oral meds  . CHOLECYSTECTOMY  1993  . TONSILLECTOMY      Home Medications:  (Not in a hospital admission)  Allergies:  Allergies  Allergen Reactions  . Codeine Nausea And Vomiting and Other (See Comments)    Nausea vomiting,  Muscle weakness  . Lisinopril Cough    ACEi-cough     Family History  Problem Relation Age of Onset  . Heart failure Mother   . Heart disease Mother   . Diabetes Mother   . Heart attack Mother   . Cancer Father        lung  . Heart attack Sister   . Diabetes Sister   . Breast cancer Half-Sister    Social History:  reports that she has never smoked. She has never used smokeless tobacco. She reports that she does not drink alcohol and does not use drugs.  ROS: A complete review of systems was performed.  All systems are negative except for pertinent findings as noted. Review of Systems  Gastrointestinal: Positive for nausea.  All other systems reviewed and are negative.    Physical Exam:  Vital signs in last 24 hours: Temp:  [98.1 F (36.7 C)-98.5 F (36.9 C)] 98.5 F (36.9 C) (06/04 1445) Pulse Rate:  [65-90] 80 (06/04 1428) Resp:  [16] 16 (06/04 1428) BP: (156-188)/(67-77) 156/70 (06/04 1428) SpO2:  [95 %-98 %] 98 % (06/04 1428) Weight:  [70.3 kg] 70.3 kg (06/04 0913) General:  Alert and oriented, No acute distress, uncomfortable appearing holding blue emesis bag  HEENT: Normocephalic, atraumatic Cardiovascular: Regular rate and rhythm Lungs: Regular rate and effort Abdomen: Soft, nontender, nondistended, no abdominal masses Back: No CVA tenderness Extremities: No edema Neurologic: Grossly intact  Laboratory Data:  Results for orders placed or performed during the hospital encounter of  09/22/20 (from the past 24 hour(s))  CBC     Status: Abnormal   Collection Time: 09/22/20  9:31 AM  Result Value Ref Range   WBC 10.3 4.0 - 10.5 K/uL   RBC 4.26 3.87 - 5.11 MIL/uL   Hemoglobin 12.7 12.0 - 15.0 g/dL   HCT 35.9 (L) 36.0 - 46.0 %   MCV 84.3 80.0 - 100.0 fL   MCH 29.8 26.0 - 34.0 pg   MCHC 35.4 30.0 - 36.0 g/dL   RDW 12.3 11.5 - 15.5 %   Platelets 267 150 - 400 K/uL   nRBC 0.0 0.0 - 0.2 %  Comprehensive metabolic panel     Status: Abnormal   Collection Time: 09/22/20  9:31 AM  Result Value Ref Range   Sodium  133 (L) 135 - 145 mmol/L   Potassium 4.4 3.5 - 5.1 mmol/L   Chloride 103 98 - 111 mmol/L   CO2 21 (L) 22 - 32 mmol/L   Glucose, Bld 212 (H) 70 - 99 mg/dL   BUN 19 8 - 23 mg/dL   Creatinine, Ser 0.90 0.44 - 1.00 mg/dL   Calcium 9.1 8.9 - 10.3 mg/dL   Total Protein 7.2 6.5 - 8.1 g/dL   Albumin 4.1 3.5 - 5.0 g/dL   AST 25 15 - 41 U/L   ALT 10 0 - 44 U/L   Alkaline Phosphatase 105 38 - 126 U/L   Total Bilirubin 0.7 0.3 - 1.2 mg/dL   GFR, Estimated >60 >60 mL/min   Anion gap 9 5 - 15  Lipase, blood     Status: None   Collection Time: 09/22/20  9:31 AM  Result Value Ref Range   Lipase 29 11 - 51 U/L  Urinalysis, Complete w Microscopic     Status: Abnormal   Collection Time: 09/22/20 10:35 AM  Result Value Ref Range   Color, Urine YELLOW (A) YELLOW   APPearance CLOUDY (A) CLEAR   Specific Gravity, Urine 1.015 1.005 - 1.030   pH 5.0 5.0 - 8.0   Glucose, UA >=500 (A) NEGATIVE mg/dL   Hgb urine dipstick MODERATE (A) NEGATIVE   Bilirubin Urine NEGATIVE NEGATIVE   Ketones, ur NEGATIVE NEGATIVE mg/dL   Protein, ur 30 (A) NEGATIVE mg/dL   Nitrite POSITIVE (A) NEGATIVE   Leukocytes,Ua LARGE (A) NEGATIVE   RBC / HPF 21-50 0 - 5 RBC/hpf   WBC, UA >50 (H) 0 - 5 WBC/hpf   Bacteria, UA MANY (A) NONE SEEN   Squamous Epithelial / LPF 6-10 0 - 5   Mucus PRESENT    Non Squamous Epithelial PRESENT (A) NONE SEEN   No results found for this or any previous visit (from the past 240 hour(s)). Creatinine: Recent Labs    09/22/20 0931  CREATININE 0.90    Impression/Assessment/plan: Left distal ureteral stone, left hydronephrosis, intractable nausea vomiting, pain-  I discussed with the patient the nature, potential benefits, risks and alternatives to cystoscopy with left retrograde pyelogram, left ureteroscopy, laser lithotripsy, left stent placement, including side effects of the proposed treatment, the likelihood of the patient achieving the goals of the procedure, and any potential problems  that might occur during the procedure or recuperation.  We discussed she may need a staged procedure with stent placement.  I do not believe she is clinically infected.  Although she had many bacteria in the urine she has no dysuria, no white count, and no fever.  Given the distal nature of the stone and smaller size it  is reasonable to attempt ureteroscopy. All questions answered. Patient elects to proceed.   Festus Aloe 09/22/2020, 3:54 PM

## 2020-09-23 ENCOUNTER — Encounter: Payer: Self-pay | Admitting: Urology

## 2020-09-23 DIAGNOSIS — N132 Hydronephrosis with renal and ureteral calculous obstruction: Secondary | ICD-10-CM | POA: Diagnosis not present

## 2020-09-23 LAB — BASIC METABOLIC PANEL
Anion gap: 7 (ref 5–15)
BUN: 22 mg/dL (ref 8–23)
CO2: 19 mmol/L — ABNORMAL LOW (ref 22–32)
Calcium: 8.3 mg/dL — ABNORMAL LOW (ref 8.9–10.3)
Chloride: 109 mmol/L (ref 98–111)
Creatinine, Ser: 0.88 mg/dL (ref 0.44–1.00)
GFR, Estimated: >60 mL/min (ref >60)
Glucose, Bld: 157 mg/dL — ABNORMAL HIGH (ref 70–99)
Potassium: 3.9 mmol/L (ref 3.5–5.1)
Sodium: 135 mmol/L (ref 135–145)

## 2020-09-23 LAB — CBC
HCT: 32.4 % — ABNORMAL LOW (ref 36.0–46.0)
Hemoglobin: 11.4 g/dL — ABNORMAL LOW (ref 12.0–15.0)
MCH: 29.9 pg (ref 26.0–34.0)
MCHC: 35.2 g/dL (ref 30.0–36.0)
MCV: 85 fL (ref 80.0–100.0)
Platelets: 268 10*3/uL (ref 150–400)
RBC: 3.81 MIL/uL — ABNORMAL LOW (ref 3.87–5.11)
RDW: 12.5 % (ref 11.5–15.5)
WBC: 12.1 10*3/uL — ABNORMAL HIGH (ref 4.0–10.5)
nRBC: 0 % (ref 0.0–0.2)

## 2020-09-23 LAB — GLUCOSE, CAPILLARY
Glucose-Capillary: 134 mg/dL — ABNORMAL HIGH (ref 70–99)
Glucose-Capillary: 136 mg/dL — ABNORMAL HIGH (ref 70–99)
Glucose-Capillary: 152 mg/dL — ABNORMAL HIGH (ref 70–99)
Glucose-Capillary: 182 mg/dL — ABNORMAL HIGH (ref 70–99)

## 2020-09-23 LAB — SARS CORONAVIRUS 2 (TAT 6-24 HRS): SARS Coronavirus 2: NEGATIVE

## 2020-09-23 MED ORDER — INSULIN ASPART 100 UNIT/ML IJ SOLN
0.0000 [IU] | Freq: Three times a day (TID) | INTRAMUSCULAR | Status: DC
  Administered 2020-09-23: 2 [IU] via SUBCUTANEOUS
  Filled 2020-09-23: qty 1

## 2020-09-23 MED ORDER — INSULIN ASPART 100 UNIT/ML IJ SOLN: 0.0000 [IU] | Freq: Every day | INTRAMUSCULAR | Status: DC

## 2020-09-23 MED ORDER — AMOXICILLIN-POT CLAVULANATE 875-125 MG PO TABS: 1.0000 | ORAL_TABLET | Freq: Two times a day (BID) | ORAL | 0 refills | Status: DC

## 2020-09-23 NOTE — Discharge Summary (Signed)
Physician Discharge Summary  Andrea Dodson MHD:[id number] DOB: 03/11/1951 DOA: 09/22/2020  PCP: Olin Hauser, DO  Admit date: 09/22/2020 Discharge date: 09/23/2020  Admitted From: Home Disposition: Home  Recommendations for Outpatient Follow-up:  1. Follow up with PCP in 1-2 weeks 2. Follow-up with urology as directed  Home Health: No Equipment/Devices: None Discharge Condition: Stable CODE STATUS: Full Diet recommendation: Heart healthy Brief/Interim Summary:70 y.o. female with medical history significant for diabetes mellitus on oral agents, hypertension, dyslipidemia and nephrolithiasis who presents to the ER for evaluation of left lower quadrant pain that woke her up from sleep at about 5 AM.  It initially started as a dull ache and was rated a 5 x 10 in intensity at its worst but has progressively worsened over the course of the day and she rated an 8 x 10 in intensity at its worst now.  Pain is associated with nausea and multiple episodes of emesis. She has frequency of urination but denies having any dysuria.  She denies having any fever or chills.  Urinalysis was consistent with infection.  Initial plan was to proceed with conservative management IV antibiotics and fluids however patient had persistent pain requiring multiple doses of IV narcotics.  Urology was consulted and took the patient for a ureteroscopy.  Was found to have a 4 mm obstructing stone which was treated with laser lithotripsy.  Post procedurally patient's pain was completely resolved.  She is tolerating oral diet.  She had a little bit of dark urine however this improved with some IV fluids.  Case discussed with urology, cleared for discharge from their standpoint.  Patient will follow up with urology in the outpatient setting.  At time of discharge will recommend 3 days of Augmentin 875 twice daily.  Remainder for medications unchanged.   Discharge Diagnoses:  Principal Problem:   Hydronephrosis with  obstructing calculus Active Problems:   Diabetes mellitus, type 2 ([place])   Essential hypertension   Acute lower UTI  Left-sided hydronephrosis with obstructing calculus Suspected urinary tract infection Status post ureteroscopy with laser lithotripsy.  This was successful.  Patient urinating without difficulty at time of discharge.  Pain well controlled.  Stable for discharge home.  Recommend Augmentin 875 twice daily times additional 3 days.  Follow-up with PCP and neurology post discharge.  Discharge Instructions  Discharge Instructions    Diet - low sodium heart healthy   Complete by: As directed    Increase activity slowly   Complete by: As directed      Allergies as of 09/23/2020      Reactions   Codeine Nausea And Vomiting, Other (See Comments)   Nausea vomiting,  Muscle weakness   Lisinopril Cough   ACEi-cough      Medication List    TAKE these medications   amoxicillin-clavulanate 875-125 MG tablet Commonly known as: Augmentin Take 1 tablet by mouth 2 (two) times daily for 3 days. Start taking on: September 24, 2020   aspirin EC 81 MG tablet Take 1 tablet (81 mg total) by mouth daily.   docusate sodium 100 MG capsule Commonly known as: Colace Take 1 capsule (100 mg total) by mouth 2 (two) times daily for 15 days.   fluticasone 50 MCG/ACT nasal spray Commonly known as: FLONASE Place 2 sprays into both nostrils daily.   glucose blood test strip Commonly known as: ONE TOUCH ULTRA TEST CHECK BLOOD SUGAR ONCE DAILY   Krill Oil 350 MG Caps Take 350 mg by mouth daily.   loratadine  10 MG tablet Commonly known as: CLARITIN Take 1 tablet (10 mg total) by mouth daily.   losartan 50 MG tablet Commonly known as: COZAAR TAKE 1 TABLET BY MOUTH  DAILY   metFORMIN 500 MG tablet Commonly known as: GLUCOPHAGE TAKE 1 TABLET BY MOUTH  TWICE DAILY WITH A MEAL   naproxen 500 MG tablet Commonly known as: Naprosyn Take 1 tablet (500 mg total) by mouth 2 (two) times daily with  a meal.   ondansetron 4 MG disintegrating tablet Commonly known as: Zofran ODT Take 1 tablet (4 mg total) by mouth every 8 (eight) hours as needed.   ONE TOUCH ULTRA 2 w/Device Kit 1 device for checking blood sugar once daily   onetouch ultrasoft lancets Use as instructed   oxyCODONE-acetaminophen 5-325 MG tablet Commonly known as: Percocet Take 1 tablet by mouth every 4 (four) hours as needed for severe pain.   rosuvastatin 10 MG tablet Commonly known as: CRESTOR TAKE 1 TABLET BY MOUTH DAILY   Shingrix injection Generic drug: Zoster Vaccine Adjuvanted Inject 0.5 mL into muscle for shingles vaccine. Repeat dose in 2-6 months.   tamsulosin 0.4 MG Caps capsule Commonly known as: Flomax Take 1 capsule (0.4 mg total) by mouth daily.       Follow-up Information    Sninsky, Brian C, MD.   Specialty: Urology Why: Office will call with appointment Contact information: [address] [place] [address] [phone]        Karamalegos, Alexander J, DO. Schedule an appointment as soon as possible for a visit in 1 week(s).   Specialty: Family Medicine Contact information: [address] [place] [address] [phone]              Allergies  Allergen Reactions  . Codeine Nausea And Vomiting and Other (See Comments)    Nausea vomiting,  Muscle weakness  . Lisinopril Cough    ACEi-cough    Consultations:  Urology   Procedures/Studies: DG OR UROLOGY CYSTO IMAGE ([facility] ONLY)  Result Date: 09/22/2020 There is no interpretation for this exam.  This order is for images obtained during a surgical procedure.  Please See "Surgeries" Tab for more information regarding the procedure.   CT Renal Stone Study  Result Date: 09/22/2020 CLINICAL DATA:  Flank pain.  Kidney stone suspected EXAM: CT ABDOMEN AND PELVIS WITHOUT CONTRAST TECHNIQUE: Multidetector CT imaging of the abdomen and pelvis was performed following the standard protocol without IV contrast. COMPARISON:   CT 03/30/2019 FINDINGS: Lower chest: Lung bases are clear. Hepatobiliary: No focal hepatic lesion. Postcholecystectomy. No biliary dilatation. Pancreas: No pancreatic inflammation Spleen: Normal spleen Adrenals/urinary tract: Adrenal glands are normal. There is LEFT renal edema and perinephric stranding. Mild hydronephrosis and hydroureter of the LEFT renal collecting system. There is an obstructing calculus in the distal LEFT ureter measuring 4 mm on image 76/2. This calculus is approximately 2 cm from the LEFT vesicoureteral junction. 3 mm nonobstructing calculus in lower pole of the LEFT kidney. Small nonobstructing 1 mm calculus in the RIGHT kidney. RIGHT renal cortical scarring. No RIGHT ureterolithiasis. No bladder calculi. Stomach/Bowel: Stomach, small bowel, appendix, and cecum are normal. The colon and rectosigmoid colon are normal. Vascular/Lymphatic: Abdominal aorta is normal caliber. No periportal or retroperitoneal adenopathy. No pelvic adenopathy. Reproductive: Uterus and adnexa unremarkable. Other: No free fluid. Musculoskeletal: No acute osseous abnormality. IMPRESSION: 1. Obstructing calculus in the distal LEFT ureter. LEFT hydronephrosis, renal edema and perinephric stranding. 2. Several additional nonobstructing renal calculi. Electronically Signed     By: Suzy Bouchard M.D.   On: 09/22/2020 09:59    (Echo, Carotid, EGD, Colonoscopy, ERCP)    Subjective: Seen and examined the time of discharge.  Stable, no distress.  Stable for discharge home.  Follow-up with PCP and urology.  Discharge Exam: Vitals:   09/23/20 0746 09/23/20 1125  BP: (!) 132/56 122/61  Pulse: 65 74  Resp: 18 18  Temp: 99 F (37.2 C) 99 F (37.2 C)  SpO2: 97% 95%   Vitals:   09/23/20 0041 09/23/20 0357 09/23/20 0746 09/23/20 1125  BP: 134/61 120/64 (!) 132/56 122/61  Pulse: 68 73 65 74  Resp: _0 Temp: 99.1 F (37.3 C) 99.1 F (37.3 C) 99 F (37.2 C) 99 F (37.2 C)  TempSrc: Oral Oral Oral  Oral  SpO2: 93% 95% 97% 95%  Weight:      Height:        General: Pt is alert, awake, not in acute distress Cardiovascular: RRR, S1/S2 +, no rubs, no gallops Respiratory: CTA bilaterally, no wheezing, no rhonchi Abdominal: Soft, NT, ND, bowel sounds + Extremities: no edema, no cyanosis    The results of significant diagnostics from this hospitalization (including imaging, microbiology, ancillary and laboratory) are listed below for reference.     Microbiology: Recent Results (from the past 240 hour(s))  SARS CORONAVIRUS 2 (TAT 6-24 HRS) Nasopharyngeal Nasopharyngeal Swab     Status: None   Collection Time: 09/22/20  2:27 PM   Specimen: Nasopharyngeal Swab  Result Value Ref Range Status   SARS Coronavirus 2 NEGATIVE NEGATIVE Final    Comment: (NOTE) SARS-CoV-2 target nucleic acids are NOT DETECTED.  The SARS-CoV-2 RNA is generally detectable in upper and lower respiratory specimens during the acute phase of infection. Negative results do not preclude SARS-CoV-2 infection, do not rule out co-infections with other pathogens, and should not be used as the sole basis for treatment or other patient management decisions. Negative results must be combined with clinical observations, patient history, and epidemiological information. The expected result is Negative.  Fact Sheet for Patients: SugarRoll.be  Fact Sheet for Healthcare Providers: https://www.woods-mathews.com/  This test is not yet approved or cleared by the [place] FDA and  has been authorized for detection and/or diagnosis of SARS-CoV-2 by FDA under an Emergency Use Authorization (EUA). This EUA will remain  in effect (meaning this test can be used) for the duration of the COVID-19 declaration under Se ction 564(b)(1) of the Act, 21 U.S.C. section 360bbb-3(b)(1), unless the authorization is terminated or revoked sooner.  Performed at [facility], [place]  [address], [place], [place] [address]      Labs: BNP (last 3 results) No results for input(s): BNP in the last 8760 hours. Basic Metabolic Panel: Recent Labs  Lab 09/22/20 0931 09/23/20 0435  NA 133* 135  K 4.4 3.9  CL 103 109  CO2 21* 19*  GLUCOSE 212* 157*  BUN 19 22  CREATININE 0.90 0.88  CALCIUM 9.1 8.3*   Liver Function Tests: Recent Labs  Lab 09/22/20 0931  AST 25  ALT 10  ALKPHOS 105  BILITOT 0.7  PROT 7.2  ALBUMIN 4.1   Recent Labs  Lab 09/22/20 0931  LIPASE 29   No results for input(s): AMMONIA in the last 168 hours. CBC: Recent Labs  Lab 09/22/20 0931 09/23/20 0435  WBC 10.3 12.1*  HGB 12.7 11.4*  HCT 35.9* 32.4*  MCV 84.3 85.0  PLT 267 268   Cardiac Enzymes:  No results for input(s): CKTOTAL, CKMB, CKMBINDEX, TROPONINI in the last 168 hours. BNP: Invalid input(s): POCBNP CBG: Recent Labs  Lab 09/22/20 2022 09/23/20 0017 09/23/20 0359 09/23/20 0744 09/23/20 1123  GLUCAP 208* 182* 152* 136* 134*   D-Dimer No results for input(s): DDIMER in the last 72 hours. Hgb A1c No results for input(s): HGBA1C in the last 72 hours. Lipid Profile No results for input(s): CHOL, HDL, LDLCALC, TRIG, CHOLHDL, LDLDIRECT in the last 72 hours. Thyroid function studies No results for input(s): TSH, T4TOTAL, T3FREE, THYROIDAB in the last 72 hours.  Invalid input(s): FREET3 Anemia work up No results for input(s): VITAMINB12, FOLATE, FERRITIN, TIBC, IRON, RETICCTPCT in the last 72 hours. Urinalysis    Component Value Date/Time   COLORURINE YELLOW (A) 09/22/2020 1035   APPEARANCEUR CLOUDY (A) 09/22/2020 1035   APPEARANCEUR Hazy (A) 04/20/2019 1503   LABSPEC 1.015 09/22/2020 1035   PHURINE 5.0 09/22/2020 1035   GLUCOSEU >=500 (A) 09/22/2020 1035   HGBUR MODERATE (A) 09/22/2020 1035   BILIRUBINUR NEGATIVE 09/22/2020 1035   BILIRUBINUR Negative 04/20/2019 1503   KETONESUR NEGATIVE 09/22/2020 1035   PROTEINUR 30 (A) 09/22/2020 1035   NITRITE POSITIVE (A)  09/22/2020 1035   LEUKOCYTESUR LARGE (A) 09/22/2020 1035   Sepsis Labs Invalid input(s): PROCALCITONIN,  WBC,  LACTICIDVEN Microbiology Recent Results (from the past 240 hour(s))  SARS CORONAVIRUS 2 (TAT 6-24 HRS) Nasopharyngeal Nasopharyngeal Swab     Status: None   Collection Time: 09/22/20  2:27 PM   Specimen: Nasopharyngeal Swab  Result Value Ref Range Status   SARS Coronavirus 2 NEGATIVE NEGATIVE Final    Comment: (NOTE) SARS-CoV-2 target nucleic acids are NOT DETECTED.  The SARS-CoV-2 RNA is generally detectable in upper and lower respiratory specimens during the acute phase of infection. Negative results do not preclude SARS-CoV-2 infection, do not rule out co-infections with other pathogens, and should not be used as the sole basis for treatment or other patient management decisions. Negative results must be combined with clinical observations, patient history, and epidemiological information. The expected result is Negative.  Fact Sheet for Patients: SugarRoll.be  Fact Sheet for Healthcare Providers: https://www.woods-mathews.com/  This test is not yet approved or cleared by the [place] FDA and  has been authorized for detection and/or diagnosis of SARS-CoV-2 by FDA under an Emergency Use Authorization (EUA). This EUA will remain  in effect (meaning this test can be used) for the duration of the COVID-19 declaration under Se ction 564(b)(1) of the Act, 21 U.S.C. section 360bbb-3(b)(1), unless the authorization is terminated or revoked sooner.  Performed at [facility], [place] [address] [place], [place] [address]      Time coordinating discharge: Over 30 minutes  SIGNED:   Sidney Ace, MD  [facility] 09/23/2020, 1:24 PM Pager   If 7PM-7AM, please contact night-coverage

## 2020-09-23 NOTE — Progress Notes (Signed)
D/C home order verified. Assessment stable, denies pain. Vitals stable. Reviewed AVS, d/c instructions, medications with patient. Verbalized understanding. Volunteer services contacted, patient has ride waiting downstairs and will be escorted to medical mall entrance.

## 2020-09-24 ENCOUNTER — Other Ambulatory Visit: Payer: Self-pay

## 2020-09-24 ENCOUNTER — Telehealth: Payer: Self-pay | Admitting: Family Medicine

## 2020-09-24 DIAGNOSIS — N200 Calculus of kidney: Secondary | ICD-10-CM

## 2020-09-24 NOTE — Telephone Encounter (Signed)
Patient called stating she is in pain after pulling her stent today. Per Diamantina Providence she is to take Ibuprofen and to call our office in an hour or so if the pain does not subside to come in for Toradol injection. He thinks it is bladder spasms.

## 2020-09-25 ENCOUNTER — Telehealth: Payer: Self-pay

## 2020-09-25 DIAGNOSIS — N390 Urinary tract infection, site not specified: Secondary | ICD-10-CM

## 2020-09-25 LAB — URINE CULTURE: Culture: >=100000 — AB

## 2020-09-25 MED ORDER — SULFAMETHOXAZOLE-TRIMETHOPRIM 800-160 MG PO TABS: 1.0000 | ORAL_TABLET | Freq: Two times a day (BID) | ORAL | 0 refills | Status: AC

## 2020-09-25 NOTE — Telephone Encounter (Signed)
-----   Message from Billey Co, MD sent at 09/25/2020  9:23 AM EDT ----- Would recommend changing her to Bactrim DS twice daily x5 days and discontinuing the Augmentin  Nickolas Madrid, MD 09/25/2020

## 2020-09-25 NOTE — Telephone Encounter (Signed)
See my chart message

## 2020-09-25 NOTE — Telephone Encounter (Signed)
Called pt's home and mobile numbers, no answer. Unable to leave message per DPR. 1st attempt.

## 2020-10-04 ENCOUNTER — Other Ambulatory Visit: Payer: Self-pay

## 2020-10-04 ENCOUNTER — Ambulatory Visit (INDEPENDENT_AMBULATORY_CARE_PROVIDER_SITE_OTHER): Payer: Medicare Other | Admitting: Family Medicine

## 2020-10-04 ENCOUNTER — Encounter: Payer: Self-pay | Admitting: Family Medicine

## 2020-10-04 VITALS — BP 149/70 | HR 77 | Ht 62.0 in | Wt 152.6 lb

## 2020-10-04 DIAGNOSIS — N3001 Acute cystitis with hematuria: Secondary | ICD-10-CM | POA: Diagnosis not present

## 2020-10-04 DIAGNOSIS — Z9889 Other specified postprocedural states: Secondary | ICD-10-CM | POA: Diagnosis not present

## 2020-10-04 DIAGNOSIS — N200 Calculus of kidney: Secondary | ICD-10-CM | POA: Diagnosis not present

## 2020-10-04 MED ORDER — CIPROFLOXACIN HCL 500 MG PO TABS: 500.0000 mg | ORAL_TABLET | Freq: Two times a day (BID) | ORAL | 0 refills | Status: DC

## 2020-10-04 MED ORDER — TRAMADOL HCL 50 MG PO TABS: 50.0000 mg | ORAL_TABLET | Freq: Four times a day (QID) | ORAL | 0 refills | Status: DC | PRN

## 2020-10-04 NOTE — Patient Instructions (Addendum)
Thank you for coming to the office today.  Tramadol as needed for pain, instead of oxycodone. Short term supply  Can still take ibuprofen, tylenol etc.  CIpro antibiotic for 5-7 days, twice a day. Caution with tendon injury risk.  If not improving 1-2 weeks, call / msg dr Diamantina Providence   Please schedule a Follow-up Appointment to: Return if symptoms worsen or fail to improve.  If you have any other questions or concerns, please feel free to call the office or send a message through [place]. You may also schedule an earlier appointment if necessary.  Additionally, you may be receiving a survey about your experience at our office within a few days to 1 week by e-mail or mail. We value your feedback.  [name], DO [place]

## 2020-10-04 NOTE — Progress Notes (Signed)
Subjective:    Patient ID: Andrea Dodson, female    DOB: 11/26/1950, 70 y.o.   MRN: [id number]  Andrea Dodson is a 70 y.o. female presenting on 10/04/2020 for Hospitalization Follow-up (Had kidney stone removal surgery and she thinks there is still infection )   [place]: [place] Date of Admission: 09/22/20 Date of Discharge: 09/23/20 Transitions of care telephone call: Not completed.  Reason for Admission: Kidney Stone Primary (+Secondary) Diagnosis: Left Nephrolithiasis  FOLLOW-UP - Hospital H&P and Discharge Summary have been reviewed - Patient presents today about 11 days after recent hospitalization. Brief summary of recent course, patient had symptoms of acute abdominal / flank pain, nausea vomiting, dysuria, frequency hospitalized, treated for pain management and kidney stone, and ultimately her symptoms worsened and she was admitted  and urology pursued treatment - ureteroscopy with laser lithotripsy. Successful, placed on Augmentin.  - Today reports overall has done well after discharge. Symptoms of nausea and overall ill feeling has improved. She has improved appetite and no vomiting.  Still has significant dysuria warmth burning with urination still with every void. She had urine culture with Citrobacter was resistant to Cefazolin.    I have reviewed the discharge medication list, and have reconciled the current and discharge medications today.   Depression screen [facility] 2/9 02/21/2020 02/01/2020 07/25/2019  Decreased Interest - 0 0  Down, Depressed, Hopeless 0 0 0  PHQ - 2 Score 0 0 0  Altered sleeping 0 - -  Tired, decreased energy 0 - -  Change in appetite 0 - -  Feeling bad or failure about yourself  0 - -  Trouble concentrating 0 - -  Moving slowly or fidgety/restless 0 - -  Suicidal thoughts 0 - -  PHQ-9 Score 0 - -  Difficult doing work/chores Not difficult at all - -    Social History   Tobacco Use   Smoking status:  Never   Smokeless tobacco: Never  Vaping Use   Vaping Use: Never used  Substance Use Topics   Alcohol use: No   Drug use: No    Review of Systems Per HPI unless specifically indicated above     Objective:    BP (!) 149/70   Pulse 77   Ht 5\' 2"  (1.575 m)   Wt 152 lb 9.6 oz (69.2 kg)   SpO2 99%   BMI 27.91 kg/m   Wt Readings from Last 3 Encounters:  10/04/20 152 lb 9.6 oz (69.2 kg)  09/22/20 154 lb 15.7 oz (70.3 kg)  08/03/20 155 lb (70.3 kg)    Physical Exam Vitals and nursing note reviewed.  Constitutional:      General: She is not in acute distress.    Appearance: She is well-developed. She is not diaphoretic.     Comments: Well-appearing, comfortable, cooperative  HENT:     Head: Normocephalic and atraumatic.  Eyes:     General:        Right eye: No discharge.        Left eye: No discharge.     Conjunctiva/sclera: Conjunctivae normal.  Neck:     Thyroid: No thyromegaly.  Cardiovascular:     Rate and Rhythm: Normal rate and regular rhythm.     Heart sounds: Normal heart sounds. No murmur heard. Pulmonary:     Effort: Pulmonary effort is normal. No respiratory distress.     Breath sounds: Normal breath sounds. No wheezing or rales.  Musculoskeletal:  General: Normal range of motion.     Cervical back: Normal range of motion and neck supple.  Lymphadenopathy:     Cervical: No cervical adenopathy.  Skin:    General: Skin is warm and dry.     Findings: No erythema or rash.  Neurological:     Mental Status: She is alert and oriented to person, place, and time.  Psychiatric:        Behavior: Behavior normal.     Comments: Well groomed, good eye contact, normal speech and thoughts     Urine Culture Order: [id number] Status: Final result   Visible to patient: Yes (seen)   Next appt: 10/16/2020 at 03:15 PM in Radiology [facility])   Specimen Information: Urine, Random      3 Result Notes   1 Patient Communication   1 Follow-up Encounter  Component  12 d ago   Specimen Description URINE, RANDOM  Performed at [facility], [address], [place], [place] [address]   Special Requests NONE  Performed at [facility], [address] [place], [place] [address]   Culture >=100,000 COLONIES/mL CITROBACTER FREUNDII Abnormal    Report Status 09/25/2020 FINAL   Organism ID, Bacteria CITROBACTER FREUNDII Abnormal    Resulting Agency [facility]      Susceptibility   Citrobacter freundii    MIC    CEFAZOLIN >=64 RESIST... Resistant    CEFEPIME <=0.12 SENS... Sensitive    CEFTRIAXONE <=0.25 SENS... Sensitive    CIPROFLOXACIN <=0.25 SENS... Sensitive    GENTAMICIN <=1 SENSITIVE  Sensitive    IMIPENEM 0.5 SENSITIVE  Sensitive    NITROFURANTOIN <=16 SENSIT... Sensitive    PIP/TAZO <=4 SENSITIVE  Sensitive    TRIMETH/SULFA <=20 SENSIT... Sensitive            Susceptibility Comments  Citrobacter freundii  >=100,000 COLONIES/mL CITROBACTER FREUNDII      Specimen Collected: 09/22/20 10:35 Last Resulted: 09/25/20 09:10         Results for orders placed or performed during the hospital encounter of 09/22/20  SARS CORONAVIRUS 2 (TAT 6-24 HRS) Nasopharyngeal Nasopharyngeal Swab   Specimen: Nasopharyngeal Swab  Result Value Ref Range   SARS Coronavirus 2 NEGATIVE NEGATIVE  Urine Culture   Specimen: Urine, Random  Result Value Ref Range   Specimen Description      URINE, RANDOM Performed at [facility], [address] [place],  [address]    Special Requests      NONE Performed at [facility], [address], [place],  [address]    Culture >=100,000 COLONIES/mL CITROBACTER FREUNDII (A)    Report Status 09/25/2020 FINAL    Organism ID, Bacteria CITROBACTER FREUNDII (A)       Susceptibility   Citrobacter freundii - MIC*    CEFAZOLIN >=64 RESISTANT Resistant     CEFEPIME <=0.12 SENSITIVE Sensitive     CEFTRIAXONE <=0.25 SENSITIVE Sensitive     CIPROFLOXACIN <=0.25  SENSITIVE Sensitive     GENTAMICIN <=1 SENSITIVE Sensitive     IMIPENEM 0.5 SENSITIVE Sensitive     NITROFURANTOIN <=16 SENSITIVE Sensitive     TRIMETH/SULFA <=20 SENSITIVE Sensitive     PIP/TAZO <=4 SENSITIVE Sensitive     * >=100,000 COLONIES/mL CITROBACTER FREUNDII  CBC  Result Value Ref Range   WBC 10.3 4.0 - 10.5 K/uL   RBC 4.26 3.87 - 5.11 MIL/uL   Hemoglobin 12.7 12.0 - 15.0 g/dL   HCT 35.9 (L) 36.0 - 46.0 %  MCV 84.3 80.0 - 100.0 fL   MCH 29.8 26.0 - 34.0 pg   MCHC 35.4 30.0 - 36.0 g/dL   RDW 12.3 11.5 - 15.5 %   Platelets 267 150 - 400 K/uL   nRBC 0.0 0.0 - 0.2 %  Comprehensive metabolic panel  Result Value Ref Range   Sodium 133 (L) 135 - 145 mmol/L   Potassium 4.4 3.5 - 5.1 mmol/L   Chloride 103 98 - 111 mmol/L   CO2 21 (L) 22 - 32 mmol/L   Glucose, Bld 212 (H) 70 - 99 mg/dL   BUN 19 8 - 23 mg/dL   Creatinine, Ser 0.90 0.44 - 1.00 mg/dL   Calcium 9.1 8.9 - 10.3 mg/dL   Total Protein 7.2 6.5 - 8.1 g/dL   Albumin 4.1 3.5 - 5.0 g/dL   AST 25 15 - 41 U/L   ALT 10 0 - 44 U/L   Alkaline Phosphatase 105 38 - 126 U/L   Total Bilirubin 0.7 0.3 - 1.2 mg/dL   GFR, Estimated >60 >60 mL/min   Anion gap 9 5 - 15  Lipase, blood  Result Value Ref Range   Lipase 29 11 - 51 U/L  Urinalysis, Complete w Microscopic  Result Value Ref Range   Color, Urine YELLOW (A) YELLOW   APPearance CLOUDY (A) CLEAR   Specific Gravity, Urine 1.015 1.005 - 1.030   pH 5.0 5.0 - 8.0   Glucose, UA >=500 (A) NEGATIVE mg/dL   Hgb urine dipstick MODERATE (A) NEGATIVE   Bilirubin Urine NEGATIVE NEGATIVE   Ketones, ur NEGATIVE NEGATIVE mg/dL   Protein, ur 30 (A) NEGATIVE mg/dL   Nitrite POSITIVE (A) NEGATIVE   Leukocytes,Ua LARGE (A) NEGATIVE   RBC / HPF 21-50 0 - 5 RBC/hpf   WBC, UA >50 (H) 0 - 5 WBC/hpf   Bacteria, UA MANY (A) NONE SEEN   Squamous Epithelial / LPF 6-10 0 - 5   Mucus PRESENT    Non Squamous Epithelial PRESENT (A) NONE SEEN  Basic metabolic panel  Result Value Ref Range    Sodium 135 135 - 145 mmol/L   Potassium 3.9 3.5 - 5.1 mmol/L   Chloride 109 98 - 111 mmol/L   CO2 19 (L) 22 - 32 mmol/L   Glucose, Bld 157 (H) 70 - 99 mg/dL   BUN 22 8 - 23 mg/dL   Creatinine, Ser 0.88 0.44 - 1.00 mg/dL   Calcium 8.3 (L) 8.9 - 10.3 mg/dL   GFR, Estimated >60 >60 mL/min   Anion gap 7 5 - 15  CBC  Result Value Ref Range   WBC 12.1 (H) 4.0 - 10.5 K/uL   RBC 3.81 (L) 3.87 - 5.11 MIL/uL   Hemoglobin 11.4 (L) 12.0 - 15.0 g/dL   HCT 32.4 (L) 36.0 - 46.0 %   MCV 85.0 80.0 - 100.0 fL   MCH 29.9 26.0 - 34.0 pg   MCHC 35.2 30.0 - 36.0 g/dL   RDW 12.5 11.5 - 15.5 %   Platelets 268 150 - 400 K/uL   nRBC 0.0 0.0 - 0.2 %  Glucose, capillary  Result Value Ref Range   Glucose-Capillary 156 (H) 70 - 99 mg/dL  Glucose, capillary  Result Value Ref Range   Glucose-Capillary 208 (H) 70 - 99 mg/dL   Comment 1 Notify RN   Glucose, capillary  Result Value Ref Range   Glucose-Capillary 182 (H) 70 - 99 mg/dL  Glucose, capillary  Result Value Ref Range   Glucose-Capillary  152 (H) 70 - 99 mg/dL  Glucose, capillary  Result Value Ref Range   Glucose-Capillary 136 (H) 70 - 99 mg/dL  Glucose, capillary  Result Value Ref Range   Glucose-Capillary 134 (H) 70 - 99 mg/dL   Comment 1 Notify RN    Comment 2 Document in Chart   CBG monitoring, ED  Result Value Ref Range   Glucose-Capillary 145 (H) 70 - 99 mg/dL      Assessment & Plan:   Problem List Items Addressed This Visit   None Visit Diagnoses     Left nephrolithiasis    -  Primary   Relevant Medications   traMADol (ULTRAM) 50 MG tablet   ciprofloxacin (CIPRO) 500 MG tablet   Acute cystitis with hematuria       Relevant Medications   traMADol (ULTRAM) 50 MG tablet   ciprofloxacin (CIPRO) 500 MG tablet   Status post laser lithotripsy of ureteral calculus       Relevant Medications   traMADol (ULTRAM) 50 MG tablet   ciprofloxacin (CIPRO) 500 MG tablet       Complicated Nephrolithiasis L sided, unable to pass 4 mm  stone Secondary UTI  S/P admit for urology ureteroscopy and laser lithotripsy  Completed IV antibiotics and discharged on PO Amoxicillin short course  Improved symptoms following resolution of stone, however has had persistent dysuria and burning sharper pelvic pain and some flank pain still.  No gross hematuria  Afebrile, no other systemic symptoms except mild nausea still with pain, no vomiting.  Tolerating PO  Will trial Cipro 500 BID 5-7 days for possible recurrent UTI or unresolved UTI, had resistance to Cefazolin on Culture CItrobacter.  Stop Oxycodone, unable to tolerate. Start Tramadol PRN, checked PDMP.  Follow up with Urology as scheduled, contact them sooner if unresolved or worsening new concerns.   Meds ordered this encounter  Medications   traMADol (ULTRAM) 50 MG tablet    Sig: Take 1 tablet (50 mg total) by mouth every 6 (six) hours as needed.    Dispense:  20 tablet    Refill:  0   ciprofloxacin (CIPRO) 500 MG tablet    Sig: Take 1 tablet (500 mg total) by mouth 2 (two) times daily. One po bid x 7 days    Dispense:  14 tablet    Refill:  0      Follow up plan: Return if symptoms worsen or fail to improve.   [name], [facility] 10/04/2020, 11:25 AM

## 2020-10-16 ENCOUNTER — Ambulatory Visit
Admission: RE | Admit: 2020-10-16 | Discharge: 2020-10-16 | Disposition: A | Discharge status: Home / Self Care | Payer: Medicare Other | Source: Ambulatory Visit | Attending: Urology | Admitting: Urology

## 2020-10-16 ENCOUNTER — Other Ambulatory Visit: Payer: Self-pay

## 2020-10-16 DIAGNOSIS — N2882 Megaloureter: Secondary | ICD-10-CM | POA: Diagnosis not present

## 2020-10-16 DIAGNOSIS — Z87442 Personal history of urinary calculi: Secondary | ICD-10-CM | POA: Diagnosis not present

## 2020-10-16 DIAGNOSIS — N200 Calculus of kidney: Secondary | ICD-10-CM | POA: Diagnosis not present

## 2020-10-24 DIAGNOSIS — E119 Type 2 diabetes mellitus without complications: Secondary | ICD-10-CM | POA: Diagnosis not present

## 2020-10-24 LAB — HM DIABETES EYE EXAM

## 2020-10-31 ENCOUNTER — Other Ambulatory Visit: Payer: Self-pay

## 2020-10-31 ENCOUNTER — Ambulatory Visit (INDEPENDENT_AMBULATORY_CARE_PROVIDER_SITE_OTHER): Payer: Medicare Other | Admitting: Urology

## 2020-10-31 ENCOUNTER — Encounter: Payer: Self-pay | Admitting: Urology

## 2020-10-31 VITALS — BP 152/74 | HR 75 | Ht 62.0 in | Wt 151.5 lb

## 2020-10-31 DIAGNOSIS — R300 Dysuria: Secondary | ICD-10-CM | POA: Diagnosis not present

## 2020-10-31 DIAGNOSIS — Z87442 Personal history of urinary calculi: Secondary | ICD-10-CM

## 2020-10-31 LAB — URINALYSIS, COMPLETE
Bilirubin, UA: NEGATIVE
Glucose, UA: NEGATIVE
Ketones, UA: NEGATIVE
Leukocytes,UA: NEGATIVE
Nitrite, UA: NEGATIVE
Protein,UA: NEGATIVE
Specific Gravity, UA: 1.005 (ref 1.005–1.030)
Urobilinogen, Ur: 0.2 mg/dL (ref 0.2–1.0)
pH, UA: 5 (ref 5.0–7.5)

## 2020-10-31 LAB — MICROSCOPIC EXAMINATION
Bacteria, UA: NONE SEEN
Epithelial Cells (non renal): NONE SEEN /hpf (ref 0–10)
WBC, UA: NONE SEEN /hpf (ref 0–5)

## 2020-10-31 NOTE — Patient Instructions (Signed)
Dietary Guidelines to Help Prevent Kidney Stones Kidney stones are deposits of minerals and salts that form inside your kidneys. Your risk of developing kidney stones may be greater depending on your diet, your lifestyle, the medicines you take, and whether you have certain medical conditions. Most people can lower their chances of developing kidney stones by following the instructions below. Your dietitian may give you more specific instructions depending on your overall health and the type of kidney stones you tend to develop. What are tips for following this plan? Reading food labels  Choose foods with "no salt added" or "low-salt" labels. Limit your salt (sodium) intake to less than 1,500 mg a day. Choose foods with calcium for each meal and snack. Try to eat about 300 mg of calcium at each meal. Foods that contain 200-500 mg of calcium a serving include: 8 oz (237 mL) of milk, calcium-fortifiednon-dairy milk, and calcium-fortifiedfruit juice. Calcium-fortified means that calcium has been added to these drinks. 8 oz (237 mL) of kefir, yogurt, and soy yogurt. 4 oz (114 g) of tofu. 1 oz (28 g) of cheese. 1 cup (150 g) of dried figs. 1 cup (91 g) of cooked broccoli. One 3 oz (85 g) can of sardines or mackerel. Most people need 1,000-1,500 mg of calcium a day. Talk to your dietitian about how much calcium is recommended for you. Shopping Buy plenty of fresh fruits and vegetables. Most people do not need to avoid fruits and vegetables, even if these foods contain nutrients that may contribute to kidney stones. When shopping for convenience foods, choose: Whole pieces of fruit. Pre-made salads with dressing on the side. Low-fat fruit and yogurt smoothies. Avoid buying frozen meals or prepared deli foods. These can be high in sodium. Look for foods with live cultures, such as yogurt and kefir. Choose high-fiber grains, such as whole-wheat breads, oat bran, and wheat cereals. Cooking Do not add  salt to food when cooking. Place a salt shaker on the table and allow each person to add his or her own salt to taste. Use vegetable protein, such as beans, textured vegetable protein (TVP), or tofu, instead of meat in pasta, casseroles, and soups. Meal planning Eat less salt, if told by your dietitian. To do this: Avoid eating processed or pre-made food. Avoid eating fast food. Eat less animal protein, including cheese, meat, poultry, or fish, if told by your dietitian. To do this: Limit the number of times you have meat, poultry, fish, or cheese each week. Eat a diet free of meat at least 2 days a week. Eat only one serving each day of meat, poultry, fish, or seafood. When you prepare animal protein, cut pieces into small portion sizes. For most meat and fish, one serving is about the size of the palm of your hand. Eat at least five servings of fresh fruits and vegetables each day. To do this: Keep fruits and vegetables on hand for snacks. Eat one piece of fruit or a handful of berries with breakfast. Have a salad and fruit at lunch. Have two kinds of vegetables at dinner. Limit foods that are high in a substance called oxalate. These include: Spinach (cooked), rhubarb, beets, sweet potatoes, and Swiss chard. Peanuts. Potato chips, french fries, and baked potatoes with skin on. Nuts and nut products. Chocolate. If you regularly take a diuretic medicine, make sure to eat at least 1 or 2 servings of fruits or vegetables that are high in potassium each day. These include: Avocado. Banana. Orange, prune,   carrot, or tomato juice. Baked potato. Cabbage. Beans and split peas. Lifestyle  Drink enough fluid to keep your urine pale yellow. This is the most important thing you can do. Spread your fluid intake throughout the day. If you drink alcohol: Limit how much you use to: 0-1 drink a day for women who are not pregnant. 0-2 drinks a day for men. Be aware of how much alcohol is in your  drink. In the [place] one drink equals one 12 oz bottle of beer (355 mL), one 5 oz glass of wine (148 mL), or one 1 oz glass of hard liquor (44 mL). Lose weight if told by your health care provider. Work with your dietitian to find an eating plan and weight loss strategies that work best for you. General information Talk to your health care provider and dietitian about taking daily supplements. You may be told the following depending on your health and the cause of your kidney stones: Not to take supplements with vitamin C. To take a calcium supplement. To take a daily probiotic supplement. To take other supplements such as magnesium, fish oil, or vitamin B6. Take over-the-counter and prescription medicines only as told by your health care provider. These include supplements. What foods should I limit? Limit your intake of the following foods, or eat them as told by your dietitian. Vegetables Spinach. Rhubarb. Beets. Canned vegetables. Pickles. Olives. Baked potatoes with skin. Grains Wheat bran. Baked goods. Salted crackers. Cereals high in sugar. Meats and other proteins Nuts. Nut butters. Large portions of meat, poultry, or fish. Salted, precooked, or cured meats, such as sausages, meat loaves, and hot dogs. Dairy Cheese. Beverages Regular soft drinks. Regular vegetable juice. Seasonings and condiments Seasoning blends with salt. Salad dressings. Soy sauce. Ketchup. Barbecue sauce. Other foods Canned soups. Canned pasta sauce. Casseroles. Pizza. Lasagna. Frozen meals. Potato chips. French fries. The items listed above may not be a complete list of foods and beverages you should limit. Contact a dietitian for more information. What foods should I avoid? Talk to your dietitian about specific foods you should avoid based on the type of kidney stones you have and your overall health. Fruits Grapefruit. The item listed above may not be a complete list of foods and beverages you should  avoid. Contact a dietitian for more information. Summary Kidney stones are deposits of minerals and salts that form inside your kidneys. You can lower your risk of kidney stones by making changes to your diet. The most important thing you can do is drink enough fluid. Drink enough fluid to keep your urine pale yellow. Talk to your dietitian about how much calcium you should have each day, and eat less salt and animal protein as told by your dietitian. This information is not intended to replace advice given to you by your health care provider. Make sure you discuss any questions you have with your health care provider. Document Revised: 03/31/2019 Document Reviewed: 03/31/2019 Elsevier Patient Education  2022 Elsevier Inc.  

## 2020-10-31 NOTE — Progress Notes (Signed)
   10/31/2020 10:26 AM   Andrea Dodson 02/06/1951 [id number]  Reason for visit: Follow up nephrolithiasis, dysuria  HPI: I saw Andrea Dodson for follow-up after undergoing ureteroscopy.  She is a 70 year old female with history of calcium oxalate nephrolithiasis who presented to the ER with intractable left-sided flank pain and nausea on 09/22/2020.  CT showed a 4 mm left distal ureteral stone and she underwent left ureteroscopy, laser lithotripsy, basket extraction, and stent placement on [place] with Dr. Junious Silk at that time.  Her urine culture ultimately did grow greater than 100k Citrobacter, and she was treated with a course of Bactrim as well as Cipro.  She removed her stent at home without issue.  I personally viewed and interpreted the CT, as well as her follow-up renal ultrasound that shows some mild dilation of the left distal ureter, but no frank hydronephrosis.  She continues to have some " twinges" of lower pelvic and bladder pain, and some dysuria.  She denies any flank pain or renal colic or nausea.  Urinalysis is pending today.  We discussed general stone prevention strategies including adequate hydration with goal of producing 2.5 L of urine daily, increasing citric acid intake, increasing calcium intake during high oxalate meals, minimizing animal protein, and decreasing salt intake. Information about dietary recommendations given today.   Will call with urinalysis results RTC 1 year KUB prior   Billey Co, MD  [place] [address] [place], [place] [address] [phone]

## 2020-11-08 ENCOUNTER — Telehealth: Payer: Self-pay | Admitting: General Practice

## 2020-11-08 ENCOUNTER — Ambulatory Visit (INDEPENDENT_AMBULATORY_CARE_PROVIDER_SITE_OTHER): Payer: Medicare Other | Admitting: General Practice

## 2020-11-08 DIAGNOSIS — E785 Hyperlipidemia, unspecified: Secondary | ICD-10-CM | POA: Diagnosis not present

## 2020-11-08 DIAGNOSIS — E1169 Type 2 diabetes mellitus with other specified complication: Secondary | ICD-10-CM

## 2020-11-08 DIAGNOSIS — I10 Essential (primary) hypertension: Secondary | ICD-10-CM | POA: Diagnosis not present

## 2020-11-08 DIAGNOSIS — Z9889 Other specified postprocedural states: Secondary | ICD-10-CM

## 2020-11-08 DIAGNOSIS — N3001 Acute cystitis with hematuria: Secondary | ICD-10-CM

## 2020-11-08 NOTE — Patient Instructions (Signed)
Visit Information  PATIENT GOALS:  Goals Addressed             This Visit's Progress    RNCM: Monitor and Manage My Blood Sugar-Diabetes Type 2       Timeframe:  Long-Range Goal Priority:  High Start Date:     11-08-2020                        Expected End Date: 11-08-2021                      Follow Up Date 01/10/2021    - check blood sugar at prescribed times - check blood sugar before and after exercise - check blood sugar if I feel it is too high or too low - enter blood sugar readings and medication or insulin into daily log - take the blood sugar log to all doctor visits - take the blood sugar meter to all doctor visits    Why is this important?   Checking your blood sugar at home helps to keep it from getting very high or very low.  Writing the results in a diary or log helps the doctor know how to care for you.  Your blood sugar log should have the time, date and the results.  Also, write down the amount of insulin or other medicine that you take.  Other information, like what you ate, exercise done and how you were feeling, will also be helpful.     Notes: 11-08-2020: Review with the patient today the goal of fasting blood sugars of <130 and post prandial of <180.  The patient states her fasting range has been 140-150.  Encouraged heart healthy/ADA diet. Will continue to monitor.      RNCM: Set My Target A1C-Diabetes Type 2       Timeframe:  Long-Range Goal Priority:  High Start Date:           11-08-2020                  Expected End Date: 11-08-2021                      Follow Up Date 01/10/2022    - set target A1C    Why is this important?   Your target A1C is decided together by you and your doctor.  It is based on several things like your age and other health issues.    Notes: 11-08-2020: Last hemoglobin A1C was 7.0. The patient will have a new A1C in October. The patient states fasting her blood sugars have been 140-150     RNCM; Management of Kidney stones  and reoccurance       The patient in the last couple months has been dealing with kidney stones and had ED visit with cystitis and hematuria. The patient said in the past when she has had stones she was able to pass without difficulty. She is doing much better now and is thankful that it has passed. Saw urologist on 11-Nov-2020 and got a good report. The urologist gave her information on foods to eat and how to prevent kidney stones. Will continue to monitor for changes.         Patient verbalizes understanding of instructions provided today and agrees to view in [place].   Telephone follow up appointment with care management team member scheduled for:01-10-2021 at 1 pm  [place], MSN, CCM  [place] Mobile: [phone]

## 2020-11-08 NOTE — Chronic Care Management (AMB) (Signed)
Chronic Care Management   CCM RN Visit Note  11/08/2020 Name: Andrea Dodson MRN: [id number] DOB: 1950-08-13  Subjective: Andrea Dodson is a 70 y.o. year old female who is a primary care patient of Olin Hauser, DO. The care management team was consulted for assistance with disease management and care coordination needs.    Engaged with patient by telephone for follow up visit in response to provider referral for case management and/or care coordination services.   Consent to Services:  The patient was given information about Chronic Care Management services, agreed to services, and gave verbal consent prior to initiation of services.  Please see initial visit note for detailed documentation.   Patient agreed to services and verbal consent obtained.   Assessment: Review of patient past medical history, allergies, medications, health status, including review of consultants reports, laboratory and other test data, was performed as part of comprehensive evaluation and provision of chronic care management services.   SDOH (Social Determinants of Health) assessments and interventions performed:    CCM Care Plan  Allergies  Allergen Reactions   Codeine Nausea And Vomiting and Other (See Comments)    Nausea vomiting,  Muscle weakness   Lisinopril Cough    ACEi-cough    Outpatient Encounter Medications as of 11/08/2020  Medication Sig   aspirin EC 81 MG tablet Take 1 tablet (81 mg total) by mouth daily.   Blood Glucose Monitoring Suppl (ONE TOUCH ULTRA 2) w/Device KIT 1 device for checking blood sugar once daily   fluticasone (FLONASE) 50 MCG/ACT nasal spray Place 2 sprays into both nostrils daily.   glucose blood (ONE TOUCH ULTRA TEST) test strip CHECK BLOOD SUGAR ONCE DAILY   Lancets (ONETOUCH ULTRASOFT) lancets Use as instructed   loratadine (CLARITIN) 10 MG tablet Take 1 tablet (10 mg total) by mouth daily.   losartan (COZAAR) 50 MG tablet TAKE 1 TABLET BY MOUTH  DAILY    metFORMIN (GLUCOPHAGE) 500 MG tablet TAKE 1 TABLET BY MOUTH  TWICE DAILY WITH A MEAL   naproxen (NAPROSYN) 500 MG tablet Take 1 tablet (500 mg total) by mouth 2 (two) times daily with a meal.   rosuvastatin (CRESTOR) 10 MG tablet TAKE 1 TABLET BY MOUTH DAILY   SHINGRIX injection Inject 0.5 mL into muscle for shingles vaccine. Repeat dose in 2-6 months.   traMADol (ULTRAM) 50 MG tablet Take 1 tablet (50 mg total) by mouth every 6 (six) hours as needed.   No facility-administered encounter medications on file as of 11/08/2020.    Patient Active Problem List   Diagnosis Date Noted   Hydronephrosis with obstructing calculus 09/22/2020   Acute lower UTI 09/22/2020   Salivary gland swelling 02/11/2019   Environmental and seasonal allergies 08/12/2018   Hyperlipidemia associated with type 2 diabetes mellitus ([place]) 01/20/2017   Overweight (BMI 25.0-29.9) 07/11/2016   Diabetes mellitus, type 2 ([place]) 07/11/2016   Essential hypertension 07/11/2016   Colon cancer screening 07/11/2016   H/O seasonal allergies 07/08/2016   Osteoarthritis of right knee 07/08/2016   Urinary incontinence 07/08/2016    Conditions to be addressed/monitored:HTN, HLD, DMII, and recent event with cystitis with hematuria with kidney stones  Care Plan : RNCM: HLD  Updates made by Vanita Ingles since 11/08/2020 12:00 AM     Problem: RNCM: [place] or Disease Self-Management (General Plan of Care)   Priority: Medium     Long-Range Goal: RNCM: Management of HLD   Start Date: 06/11/2020  Expected End Date: 09/04/2021  This Visit's Progress: On track  Recent Progress: On track  Priority: Medium  Note:   Current Barriers:  Poorly controlled hyperlipidemia, complicated by eating choices at times, elevated hemoglobin A1C of 7.6 on 02-01-2020.  Most recent 08-03-2020, 7.0 % Current antihyperlipidemic regimen: Rosuvastatin 10 mg QD Most recent lipid panel:     Component Value Date/Time   CHOL 123 01/24/2020  0951   CHOL 202 (H) 07/15/2016 1106   TRIG 165 (H) 01/24/2020 0951   HDL 51 01/24/2020 0951   HDL 50 07/15/2016 1106   CHOLHDL 2.4 01/24/2020 0951   LDLCALC 48 01/24/2020 0951   ASCVD risk enhancing conditions: age >62, DM, HTN Unable to independently manage HLD Does not contact provider office for questions/concerns  RN Care Manager Clinical Goal(s):  Over the next 120 days, patient will work with Consulting civil engineer, providers, and care team towards execution of optimized self-health management plan patient will verbalize understanding of plan for effective management of HLD patient will work with [facility] and pcp  to address needs related to HLD  patient will attend all scheduled medical appointments:02-04-2021  Interventions: Collaboration with [place] Ranger Devonne Doughty, DO regarding development and update of comprehensive plan of care as evidenced by provider attestation and co-signature Inter-disciplinary care team collaboration (see longitudinal plan of care) Medication review performed; medication list updated in electronic medical record.  Inter-disciplinary care team collaboration (see longitudinal plan of care) Referred to pharmacy team for assistance with HLD medication management Evaluation of current treatment plan related to HLD and patient's adherence to plan as established by provider. 11-08-2020: The patient is doing well and denies any acute distress. The patient states she is eating well and taking medications as directed.  Advised patient to call the office for changes in conditions or questions  Provided education to patient re: Heart Healthy/ADA diet, rest, and monitoring for changes in health. 11-08-2020: The patient states she is eating very well and is trying to be mindful of her dietary intake. Denies any issues with her dietary restrictions.  Reviewed scheduled/upcoming provider appointments including: 02-04-2021 Discussed plans with patient for ongoing care management  follow up and provided patient with direct contact information for care management team   Patient Goals/Self-Care Activities: Over the next 120 days, patient will:   - call for medicine refill 2 or 3 days before it runs out - call if I am sick and can't take my medicine - keep a list of all the medicines I take; vitamins and herbals too - learn to read medicine labels - use a pillbox to sort medicine - use an alarm clock or phone to remind me to take my medicine - change to whole grain breads, cereal, pasta - drink 6 to 8 glasses of water each day - eat 5 or 6 small meals each day - fill half the plate with nonstarchy vegetables - limit fast food meals to no more than 1 per week - manage portion size - prepare main meal at home 3 to 5 days each week - read food labels for fat, fiber, carbohydrates and portion size - be open to making changes - I can manage, know and watch for signs of a heart attack - if I have chest pain, call for help - learn about small changes that will make a big difference - learn my personal risk factors  - barriers to meeting goals identified - change-talk evoked - choices provided - collaboration with team encouraged - decision-making supported - health risks reviewed -  problem-solving facilitated - questions answered - readiness for change evaluated - reassurance provided - self-reflection promoted - self-reliance encouraged  Follow Up Plan: Telephone follow up appointment with care management team member scheduled for: 01-10-2021 at 1 pm      Task: RNCM: Goals for HLD Completed 11/08/2020  Outcome: Positive  Note:   Care Management Activities:    - barriers to meeting goals identified - change-talk evoked - choices provided - collaboration with team encouraged - decision-making supported - health risks reviewed - problem-solving facilitated - questions answered - readiness for change evaluated - reassurance provided - self-reflection  promoted - self-reliance encouraged        Care Plan : RNCM: Hypertension (Adult)  Updates made by Vanita Ingles since 11/08/2020 12:00 AM     Problem: RNCM: Hypertension (Hypertension)   Priority: Medium     Long-Range Goal: RNCM: Hypertension Monitored   Start Date: 06/11/2020  Expected End Date: 09/04/2021  This Visit's Progress: On track  Priority: Medium  Note:   Objective:  Last practice recorded BP readings:  BP Readings from Last 3 Encounters:  10/31/20 (!) 152/74  10/04/20 (!) 149/70  09/23/20 122/61   Most recent eGFR/CrCl: No results found for: EGFR  No components found for: CRCL Current Barriers:  Knowledge Deficits related to basic understanding of hypertension pathophysiology and self care management Knowledge Deficits related to understanding of medications prescribed for management of hypertension Unable to independently HTN Does not contact provider office for questions/concerns Case Manager Clinical Goal(s):  patient will verbalize understanding of plan for hypertension management patient will attend all scheduled medical appointments: 02-04-2021 at 2 pm patient will demonstrate improved adherence to prescribed treatment plan for hypertension as evidenced by taking all medications as prescribed, monitoring and recording blood pressure as directed, adhering to low sodium/DASH diet patient will demonstrate improved health management independence as evidenced by checking blood pressure as directed and notifying PCP if SBP>160 or DBP > 90, taking all medications as prescribe, and adhering to a low sodium diet as discussed. patient will verbalize basic understanding of hypertension disease process and self health management plan as evidenced by compliance with heart healthy/ADA diet, compliance with medications, and working with the CCM team to manage health and well being Interventions:  Collaboration with Parks Ranger, Devonne Doughty, DO regarding development and  update of comprehensive plan of care as evidenced by provider attestation and co-signature Inter-disciplinary care team collaboration (see longitudinal plan of care) Evaluation of current treatment plan related to hypertension self management and patient's adherence to plan as established by provider. Provided education to patient re: stroke prevention, s/s of heart attack and stroke, DASH diet, complications of uncontrolled blood pressure Reviewed medications with patient and discussed importance of compliance Discussed plans with patient for ongoing care management follow up and provided patient with direct contact information for care management team Advised patient, providing education and rationale, to monitor blood pressure daily and record, calling PCP for findings outside established parameters.  Reviewed scheduled/upcoming provider appointments including: 02-04-2021 at 2 pm Self-Care Activities: - Self administers medications as prescribed Attends all scheduled provider appointments Calls provider office for new concerns, questions, or BP outside discussed parameters Checks BP and records as discussed Follows a low sodium diet/DASH diet Patient Goals: - check blood pressure 3 times per week - choose a place to take my blood pressure (home, clinic or office, retail store) - write blood pressure results in a log or diary - agree on reward when goals are met -  agree to work together to make changes - ask questions to understand - have a family meeting to talk about healthy habits - learn about high blood pressure  Follow Up Plan: Telephone follow up appointment with care management team member scheduled for: 01-10-2021 at 1 pm    Task: RNCM: Identify and Monitor Blood Pressure Elevation Completed 11/08/2020  Outcome: Positive  Note:   Care Management Activities:    - blood pressure trends reviewed - depression screen reviewed - home or ambulatory blood pressure monitoring  encouraged        Care Plan : RNCM: Diabetes Type 2 (Adult)  Updates made by Vanita Ingles since 11/08/2020 12:00 AM     Problem: RNCM: Glycemic Management (Diabetes, Type 2)   Priority: Medium     Long-Range Goal: RNCM: Glycemic Management Optimized   Start Date: 06/11/2020  Expected End Date: 09/04/2021  This Visit's Progress: On track  Priority: Medium  Note:   Objective:  Lab Results  Component Value Date   HGBA1C 7.0 (A) 08/03/2020    Lab Results  Component Value Date   CREATININE 0.83 01/24/2020   BUN 14 01/24/2020   NA 137 01/24/2020   K 4.3 01/24/2020   CL 103 01/24/2020   CO2 22 01/24/2020     No results found for: EGFR Current Barriers:  Knowledge Deficits related to basic Diabetes pathophysiology and self care/management Knowledge Deficits related to medications used for management of diabetes Does not contact provider office for questions/concerns Case Manager Clinical Goal(s):  patient will demonstrate improved adherence to prescribed treatment plan for diabetes self care/management as evidenced by: daily monitoring and recording of CBG  adherence to ADA/ carb modified diet exercise 4/5 days/week adherence to prescribed medication regimen contacting provider for new or worsened symptoms or questions Interventions:  Collaboration with Olin Hauser, DO regarding development and update of comprehensive plan of care as evidenced by provider attestation and co-signature Inter-disciplinary care team collaboration (see longitudinal plan of care) Provided education to patient about basic DM disease process Reviewed medications with patient and discussed importance of medication adherence. 11-08-2020: The patient is compliant with her medications, denies any changes or needs. Discussed plans with patient for ongoing care management follow up and provided patient with direct contact information for care management team Provided patient with written  educational materials related to hypo and hyperglycemia and importance of correct treatment. 11-08-2020: The patient denies any lows at this time.  Reviewed scheduled/upcoming provider appointments including: 02-04-2021 at 2 pm Advised patient, providing education and rationale, to check cbg bid and record, calling pcp for findings outside established parameters.  The patient states her blood sugars have been around "150".  Education on fasting <130 and post prandial <180.  The patient states hers is sometimes greater than 150 but that is usually when she has eat "bad".  The patient states that she is trying to watch her diet better since the holidays are over. 08-23-2020: The patient states she is doing better with managing  her DM. She sometimes eats things that are not the best for her but she has a better hemoglobin A1C at last appointment in April. She is also more active now that the weather has changed. She enjoys the pretty weather and doing her craft shows once a month. The patient states that she is doing well. 11-08-2020: The patient states that her fasting blood sugars are around 140-150, review and education on normal parameters. Discussed A1C results and the patient will have  new blood work in October. Reviewed of A1C goal of <7.0 to work toward.  Review of patient status, including review of consultants reports, relevant laboratory and other test results, and medications completed. Patient Goals/Self-Care Activities - Self administers oral medications as prescribed Self administers insulin as prescribed Attends all scheduled provider appointments Checks blood sugars as prescribed and utilize hyper and hypoglycemia protocol as needed Adheres to prescribed ADA/carb modified - barriers to adherence to treatment plan identified - blood glucose monitoring encouraged - blood glucose readings reviewed - individualized medical nutrition therapy provided - mutual A1C goal set or reviewed -  self-awareness of signs/symptoms of hypo or hyperglycemia encouraged - use of blood glucose monitoring log promoted Follow Up Plan: Telephone follow up appointment with care management team member scheduled for: 01-10-2021 at 1 pm    Task: RNCM: Alleviate Barriers to Glycemic Management Completed 11/08/2020  Outcome: Positive  Note:   Care Management Activities:    - barriers to adherence to treatment plan identified - blood glucose monitoring encouraged - blood glucose readings reviewed - individualized medical nutrition therapy provided - mutual A1C goal set or reviewed - self-awareness of signs/symptoms of hypo or hyperglycemia encouraged - use of blood glucose monitoring log promoted         Plan:Telephone follow up appointment with care management team member scheduled for:  01-10-2021 at 1 pm  [place], MSN, [place] Mobile: [phone]

## 2020-11-30 ENCOUNTER — Encounter: Payer: Self-pay | Admitting: Physician Assistant

## 2020-11-30 ENCOUNTER — Ambulatory Visit (INDEPENDENT_AMBULATORY_CARE_PROVIDER_SITE_OTHER): Payer: Medicare Other | Admitting: Physician Assistant

## 2020-11-30 ENCOUNTER — Other Ambulatory Visit: Payer: Self-pay

## 2020-11-30 VITALS — BP 144/83 | HR 68 | Ht 62.0 in | Wt 151.0 lb

## 2020-11-30 DIAGNOSIS — R300 Dysuria: Secondary | ICD-10-CM

## 2020-11-30 LAB — URINALYSIS, COMPLETE
Bilirubin, UA: NEGATIVE
Glucose, UA: NEGATIVE
Ketones, UA: NEGATIVE
Nitrite, UA: POSITIVE — AB
Specific Gravity, UA: 1.02 (ref 1.005–1.030)
Urobilinogen, Ur: 0.2 mg/dL (ref 0.2–1.0)
pH, UA: 5 (ref 5.0–7.5)

## 2020-11-30 LAB — MICROSCOPIC EXAMINATION: WBC, UA: >30 /hpf — AB (ref 0–5)

## 2020-11-30 MED ORDER — SULFAMETHOXAZOLE-TRIMETHOPRIM 800-160 MG PO TABS
1.0000 | ORAL_TABLET | Freq: Two times a day (BID) | ORAL | 0 refills | Status: AC
Start: 2020-11-30 — End: 2020-12-05

## 2020-11-30 NOTE — Progress Notes (Signed)
11/30/2020 4:17 PM   Andrea Dodson January 16, 1951 [id number]  CC: Chief Complaint  Patient presents with   Follow-up    follow-up    HPI: Andrea Dodson is a 70 y.o. female with PMH nephrolithiasis s/p ureteroscopy in June 2022 and recurrent UTI who presents today for symptom recheck.  She was seen in clinic most recently by Dr. Richardo Hanks 1 month ago for ureteroscopy follow-up, at which point she reported persistent twinges of lower pelvic and bladder pain as well as dysuria.  UA and microscopy were benign that day.  Today she reports her discomfort resolved approximately 2 weeks ago, however she developed new dysuria and cloudy urine 3 days ago.  She denies fever, chills, nausea, vomiting, gross hematuria, and flank pain.  In-office UA today positive for 2+ blood, 2+ protein, nitrites, and 1+ leukocyte esterase; urine microscopy with >30 WBCs/HPF, 3-10 RBCs/HPF, and many bacteria.   PMH: Past Medical History:  Diagnosis Date   Allergy    Arthritis    right knee   Diabetes mellitus, type 2 (HCC)    Hypertension    Urinary incontinence    Wears dentures    full upper and lower    Surgical History: Past Surgical History:  Procedure Laterality Date   APPENDECTOMY     CATARACT EXTRACTION W/PHACO Left 08/08/2019   Procedure: CATARACT EXTRACTION PHACO AND INTRAOCULAR LENS PLACEMENT (IOC) LEFT 4.59  00:32.3;  Surgeon: Nevada Crane, MD;  Location: [facility];  Service: Ophthalmology;  Laterality: Left;  Diabetic - oral meds   CATARACT EXTRACTION W/PHACO Right 09/05/2019   Procedure: CATARACT EXTRACTION PHACO AND INTRAOCULAR LENS PLACEMENT (IOC) RIGHT DIABETIC 4.65  00:34.2;  Surgeon: Nevada Crane, MD;  Location: [facility];  Service: Ophthalmology;  Laterality: Right;  Diabetic - oral meds   CHOLECYSTECTOMY  1993   CYSTOSCOPY/URETEROSCOPY/HOLMIUM LASER/STENT PLACEMENT Left 09/22/2020   Procedure: CYSTOSCOPY/URETEROSCOPY/HOLMIUM LASER/STENT PLACEMENT;   Surgeon: Jerilee Field, MD;  Location: [facility];  Service: Urology;  Laterality: Left;   TONSILLECTOMY      Home Medications:  Allergies as of 11/30/2020       Reactions   Codeine Nausea And Vomiting, Other (See Comments)   Nausea vomiting,  Muscle weakness   Lisinopril Cough   ACEi-cough        Medication List        Accurate as of November 30, 2020  4:17 PM. If you have any questions, ask your nurse or doctor.          STOP taking these medications    Shingrix injection Generic drug: Zoster Vaccine Adjuvanted Stopped by: Carman Ching, PA-C       TAKE these medications    aspirin EC 81 MG tablet Take 1 tablet (81 mg total) by mouth daily.   fluticasone 50 MCG/ACT nasal spray Commonly known as: FLONASE Place 2 sprays into both nostrils daily.   glucose blood test strip Commonly known as: ONE TOUCH ULTRA TEST CHECK BLOOD SUGAR ONCE DAILY   loratadine 10 MG tablet Commonly known as: CLARITIN Take 1 tablet (10 mg total) by mouth daily.   losartan 50 MG tablet Commonly known as: COZAAR TAKE 1 TABLET BY MOUTH  DAILY   metFORMIN 500 MG tablet Commonly known as: GLUCOPHAGE TAKE 1 TABLET BY MOUTH  TWICE DAILY WITH A MEAL   naproxen 500 MG tablet Commonly known as: Naprosyn Take 1 tablet (500 mg total) by mouth 2 (two) times daily with a meal.   ONE TOUCH  ULTRA 2 w/Device Kit 1 device for checking blood sugar once daily   onetouch ultrasoft lancets Use as instructed   rosuvastatin 10 MG tablet Commonly known as: CRESTOR TAKE 1 TABLET BY MOUTH DAILY   sulfamethoxazole-trimethoprim 800-160 MG tablet Commonly known as: BACTRIM DS Take 1 tablet by mouth 2 (two) times daily for 5 days. Started by: Debroah Loop, PA-C   traMADol 50 MG tablet Commonly known as: ULTRAM Take 1 tablet (50 mg total) by mouth every 6 (six) hours as needed.        Allergies:  Allergies  Allergen Reactions   Codeine Nausea And Vomiting and Other  (See Comments)    Nausea vomiting,  Muscle weakness   Lisinopril Cough    ACEi-cough    Family History: Family History  Problem Relation Age of Onset   Heart failure Mother    Heart disease Mother    Diabetes Mother    Heart attack Mother    Cancer Father        lung   Heart attack Sister    Diabetes Sister    Breast cancer Half-Sister     Social History:   reports that she has never smoked. She has never used smokeless tobacco. She reports that she does not drink alcohol and does not use drugs.  Physical Exam: BP (!) 144/83   Pulse 68   Ht $R'5\' 2"'dN$  (1.575 m)   Wt 151 lb (68.5 kg)   BMI 27.62 kg/m   Constitutional:  Alert and oriented, no acute distress, nontoxic appearing HEENT: , AT Cardiovascular: No clubbing, cyanosis, or edema Respiratory: Normal respiratory effort, no increased work of breathing Skin: No rashes, bruises or suspicious lesions Neurologic: Grossly intact, no focal deficits, moving all 4 extremities Psychiatric: Normal mood and affect  Laboratory Data: Results for orders placed or performed in visit on 11/30/20  Microscopic Examination   Urine  Result Value Ref Range   WBC, UA >30 (A) 0 - 5 /hpf   RBC 3-10 (A) 0 - 2 /hpf   Epithelial Cells (non renal) 0-10 0 - 10 /hpf   Bacteria, UA Many (A) None seen/Few  Urinalysis, Complete  Result Value Ref Range   Specific Gravity, UA 1.020 1.005 - 1.030   pH, UA 5.0 5.0 - 7.5   Color, UA Yellow Yellow   Appearance Ur Cloudy (A) Clear   Leukocytes,UA 1+ (A) Negative   Protein,UA 2+ (A) Negative/Trace   Glucose, UA Negative Negative   Ketones, UA Negative Negative   RBC, UA 2+ (A) Negative   Bilirubin, UA Negative Negative   Urobilinogen, Ur 0.2 0.2 - 1.0 mg/dL   Nitrite, UA Positive (A) Negative   Microscopic Examination See below:    Assessment & Plan:   1. Dysuria Post ureteroscopy symptoms have resolved, however she newly developed acute cystitis symptoms 3 days ago.  Urine is grossly infected  today.  Will start empiric Bactrim and send for culture for further evaluation.  We will plan for repeat UA upon completion of culture appropriate antibiotics to prove resolution of MH.  Patient is in agreement with this plan. - Urinalysis, Complete - CULTURE, URINE COMPREHENSIVE - sulfamethoxazole-trimethoprim (BACTRIM DS) 800-160 MG tablet; Take 1 tablet by mouth 2 (two) times daily for 5 days.  Dispense: 10 tablet; Refill: 0 - Urinalysis, Complete; Future  Return in about 1 week (around 12/07/2020) for Lab visit for UA.  Debroah Loop, PA-C  [facility] [address], [place], [place]  [address] [phone]    

## 2020-12-05 LAB — CULTURE, URINE COMPREHENSIVE

## 2020-12-07 ENCOUNTER — Other Ambulatory Visit: Payer: Self-pay

## 2020-12-07 ENCOUNTER — Other Ambulatory Visit: Payer: Medicare Other

## 2020-12-07 DIAGNOSIS — R300 Dysuria: Secondary | ICD-10-CM | POA: Diagnosis not present

## 2020-12-07 LAB — MICROSCOPIC EXAMINATION: Bacteria, UA: NONE SEEN

## 2020-12-07 LAB — URINALYSIS, COMPLETE
Bilirubin, UA: NEGATIVE
Glucose, UA: NEGATIVE
Ketones, UA: NEGATIVE
Leukocytes,UA: NEGATIVE
Nitrite, UA: NEGATIVE
Protein,UA: NEGATIVE
RBC, UA: NEGATIVE
Specific Gravity, UA: 1.02 (ref 1.005–1.030)
Urobilinogen, Ur: 0.2 mg/dL (ref 0.2–1.0)
pH, UA: 5 (ref 5.0–7.5)

## 2020-12-17 ENCOUNTER — Telehealth: Payer: Self-pay

## 2020-12-17 NOTE — Telephone Encounter (Signed)
Notified patient as advised, patient expressed understanding.  

## 2020-12-17 NOTE — Telephone Encounter (Signed)
-----   Message from Debroah Loop, [place] sent at 12/17/2020  1:32 PM EDT ----- Repeat UA shows resolution of MH after completion of culture appropriate antibiotics, excellent news! She may follow up as needed. ----- Message ----- From: Lavone Neri Lab Results In Sent: 11/30/2020   4:36 PM EDT To: Debroah Loop, PA-C

## 2021-01-10 ENCOUNTER — Ambulatory Visit (INDEPENDENT_AMBULATORY_CARE_PROVIDER_SITE_OTHER): Payer: Medicare Other

## 2021-01-10 ENCOUNTER — Telehealth: Payer: Medicare Other | Admitting: General Practice

## 2021-01-10 DIAGNOSIS — E1169 Type 2 diabetes mellitus with other specified complication: Secondary | ICD-10-CM

## 2021-01-10 DIAGNOSIS — N390 Urinary tract infection, site not specified: Secondary | ICD-10-CM

## 2021-01-10 DIAGNOSIS — I10 Essential (primary) hypertension: Secondary | ICD-10-CM

## 2021-01-10 NOTE — Patient Instructions (Signed)
Visit Information  PATIENT GOALS:  Goals Addressed             This Visit's Progress    RNCM: Monitor and Manage My Blood Sugar-Diabetes Type 2       Timeframe:  Long-Range Goal Priority:  High Start Date:     11-08-2020                        Expected End Date: 11-08-2021                      Follow Up Date 03/21/2021    - check blood sugar at prescribed times - check blood sugar before and after exercise - check blood sugar if I feel it is too high or too low - enter blood sugar readings and medication or insulin into daily log - take the blood sugar log to all doctor visits - take the blood sugar meter to all doctor visits    Why is this important?   Checking your blood sugar at home helps to keep it from getting very high or very low.  Writing the results in a diary or log helps the doctor know how to care for you.  Your blood sugar log should have the time, date and the results.  Also, write down the amount of insulin or other medicine that you take.  Other information, like what you ate, exercise done and how you were feeling, will also be helpful.     Notes: 11-08-2020: Review with the patient today the goal of fasting blood sugars of <130 and post prandial of <180.  The patient states her fasting range has been 140-150.  Encouraged heart healthy/ADA diet. Will continue to monitor. 01-10-2021: The patient states that her blood sugars are consistently staying at 130-140. Review of parameters and goals of blood sugars. Will continue to monitor.      RNCM: Set My Target A1C-Diabetes Type 2       Timeframe:  Long-Range Goal Priority:  High Start Date:           11-08-2020                  Expected End Date: 11-08-2021                      Follow Up Date 03/21/2022    - set target A1C  Lab Results  Component Value Date   HGBA1C 7.0 (A) 08/03/2020      Why is this important?   Your target A1C is decided together by you and your doctor.  It is based on several things like  your age and other health issues.    Notes: 11-08-2020: Last hemoglobin A1C was 7.0. The patient will have a new A1C in October. The patient states fasting her blood sugars have been 140-150. 01-10-2021: The patient states that her blood sugars are staying 130-140 The patient denies any issues with highs or lows. Will have new blood work in October.      RNCM; Management of Kidney stones and reoccurance       The patient in the last couple months has been dealing with kidney stones and had ED visit with cystitis and hematuria. The patient said in the past when she has had stones she was able to pass without difficulty. She is doing much better now and is thankful that it has passed. Saw urologist on 2020/11/26 and  got a good report. The urologist gave her information on foods to eat and how to prevent kidney stones. Will continue to monitor for changes.  01-10-2021: The patient states that she has had a time with the kidney stones and recurrent kidney stones. She has  had runs of abx therapy and last UA that was tested came back clear for UTI. The patient is thankful for this.  Patient Goals: *Call for changes in urinary health, burning, smell, frequency, urgency, or other changes *Stay hydrated and drink plenty of water  *Take medications as ordered *Continue to work with the CCM team to optimize urinary health and well being.         Patient verbalizes understanding of instructions provided today and agrees to view in [place].   Telephone follow up appointment with care management team member scheduled for: 03-21-2021  Noreene Larsson RN, MSN, [place] Mobile: [phone]

## 2021-01-10 NOTE — Chronic Care Management (AMB) (Signed)
Chronic Care Management   CCM RN Visit Note  01/10/2021 Name: Andrea Dodson MRN: [id number] DOB: 08-28-1950  Subjective: Andrea Dodson is a 70 y.o. year old female who is a primary care patient of Olin Hauser, DO. The care management team was consulted for assistance with disease management and care coordination needs.    Engaged with patient by telephone for follow up visit in response to provider referral for case management and/or care coordination services.   Consent to Services:  The patient was given information about Chronic Care Management services, agreed to services, and gave verbal consent prior to initiation of services.  Please see initial visit note for detailed documentation.   Patient agreed to services and verbal consent obtained.   Assessment: Review of patient past medical history, allergies, medications, health status, including review of consultants reports, laboratory and other test data, was performed as part of comprehensive evaluation and provision of chronic care management services.   SDOH (Social Determinants of Health) assessments and interventions performed:  SDOH Interventions    Flowsheet Row Most Recent Value  SDOH Interventions   Financial Strain Interventions Intervention Not Indicated  Physical Activity Interventions Other (Comments)  [no structured activity, the patient does work in her yard]  Social Connections Interventions Intervention Not Indicated, Other (Comment)  [good support system, denies any issues]        CCM Care Plan  Allergies  Allergen Reactions   Codeine Nausea And Vomiting and Other (See Comments)    Nausea vomiting,  Muscle weakness   Lisinopril Cough    ACEi-cough    Outpatient Encounter Medications as of 01/10/2021  Medication Sig   aspirin EC 81 MG tablet Take 1 tablet (81 mg total) by mouth daily.   Blood Glucose Monitoring Suppl (ONE TOUCH ULTRA 2) w/Device KIT 1 device for checking blood sugar once  daily   fluticasone (FLONASE) 50 MCG/ACT nasal spray Place 2 sprays into both nostrils daily.   glucose blood (ONE TOUCH ULTRA TEST) test strip CHECK BLOOD SUGAR ONCE DAILY   Lancets (ONETOUCH ULTRASOFT) lancets Use as instructed   loratadine (CLARITIN) 10 MG tablet Take 1 tablet (10 mg total) by mouth daily.   losartan (COZAAR) 50 MG tablet TAKE 1 TABLET BY MOUTH  DAILY   metFORMIN (GLUCOPHAGE) 500 MG tablet TAKE 1 TABLET BY MOUTH  TWICE DAILY WITH A MEAL   naproxen (NAPROSYN) 500 MG tablet Take 1 tablet (500 mg total) by mouth 2 (two) times daily with a meal.   rosuvastatin (CRESTOR) 10 MG tablet TAKE 1 TABLET BY MOUTH DAILY   traMADol (ULTRAM) 50 MG tablet Take 1 tablet (50 mg total) by mouth every 6 (six) hours as needed.   No facility-administered encounter medications on file as of 01/10/2021.    Patient Active Problem List   Diagnosis Date Noted   Hydronephrosis with obstructing calculus 09/22/2020   Acute lower UTI 09/22/2020   Salivary gland swelling 02/11/2019   Environmental and seasonal allergies 08/12/2018   Hyperlipidemia associated with type 2 diabetes mellitus ([place]) 01/20/2017   Overweight (BMI 25.0-29.9) 07/11/2016   Diabetes mellitus, type 2 ([place]) 07/11/2016   Essential hypertension 07/11/2016   Colon cancer screening 07/11/2016   H/O seasonal allergies 07/08/2016   Osteoarthritis of right knee 07/08/2016   Urinary incontinence 07/08/2016    Conditions to be addressed/monitored:HTN, HLD, DMII, and history of kidney stones and UTI  Care Plan : RNCM: HLD  Updates made by Vanita Ingles, RN since 01/10/2021 12:00 AM  Problem: RNCM: HLD Health Promotion or Disease Self-Management (General Plan of Care)   Priority: Medium     Long-Range Goal: RNCM: Management of HLD   Start Date: 06/11/2020  Expected End Date: 09/04/2021  This Visit's Progress: On track  Recent Progress: On track  Priority: Medium  Note:   Current Barriers:  Poorly controlled  hyperlipidemia, complicated by eating choices at times, elevated hemoglobin A1C of 7.6 on 02-01-2020.  Most recent 08-03-2020, 7.0 % Current antihyperlipidemic regimen: Rosuvastatin 10 mg QD Most recent lipid panel:     Component Value Date/Time   CHOL 123 01/24/2020 0951   CHOL 202 (H) 07/15/2016 1106   TRIG 165 (H) 01/24/2020 0951   HDL 51 01/24/2020 0951   HDL 50 07/15/2016 1106   CHOLHDL 2.4 01/24/2020 0951   LDLCALC 48 01/24/2020 0951   ASCVD risk enhancing conditions: age >63, DM, HTN Unable to independently manage HLD Does not contact provider office for questions/concerns  RN Care Manager Clinical Goal(s):   patient will work with Consulting civil engineer, providers, and care team towards execution of optimized self-health management plan patient will verbalize understanding of plan for effective management of HLD patient will work with [facility] and pcp  to address needs related to HLD  patient will attend all scheduled medical appointments:02-04-2021  Interventions: Collaboration with [place] Devonne Doughty, DO regarding development and update of comprehensive plan of care as evidenced by provider attestation and co-signature Inter-disciplinary care team collaboration (see longitudinal plan of care) Medication review performed; medication list updated in electronic medical record.  Inter-disciplinary care team collaboration (see longitudinal plan of care) Referred to pharmacy team for assistance with HLD medication management Evaluation of current treatment plan related to HLD and patient's adherence to plan as established by provider. 01-10-2021: The patient is doing well and denies any acute distress. The patient states she is eating well and taking medications as directed. Denies any changes in her HLD health and well being. Will continue to monitor.  Advised patient to call the office for changes in conditions or questions  Provided education to patient re: Heart Healthy/ADA diet,  rest, and monitoring for changes in health. 11-08-2020: The patient states she is eating very well and is trying to be mindful of her dietary intake. Denies any issues with her dietary restrictions.  Reviewed scheduled/upcoming provider appointments including: 02-04-2021 at 2 pm Discussed plans with patient for ongoing care management follow up and provided patient with direct contact information for care management team   Patient Goals/Self-Care Activities: patient will:   - call for medicine refill 2 or 3 days before it runs out - call if I am sick and can't take my medicine - keep a list of all the medicines I take; vitamins and herbals too - learn to read medicine labels - use a pillbox to sort medicine - use an alarm clock or phone to remind me to take my medicine - change to whole grain breads, cereal, pasta - drink 6 to 8 glasses of water each day - eat 5 or 6 small meals each day - fill half the plate with nonstarchy vegetables - limit fast food meals to no more than 1 per week - manage portion size - prepare main meal at home 3 to 5 days each week - read food labels for fat, fiber, carbohydrates and portion size - be open to making changes - I can manage, know and watch for signs of a heart attack - if I have chest pain,  call for help - learn about small changes that will make a big difference - learn my personal risk factors  - barriers to meeting goals identified - change-talk evoked - choices provided - collaboration with team encouraged - decision-making supported - health risks reviewed - problem-solving facilitated - questions answered - readiness for change evaluated - reassurance provided - self-reflection promoted - self-reliance encouraged  Follow Up Plan: Telephone follow up appointment with care management team member scheduled for: 03-21-2021 at 1 pm      Care Plan : RNCM: Hypertension (Adult)  Updates made by Vanita Ingles, RN since 01/10/2021 12:00  AM     Problem: RNCM: Hypertension (Hypertension)   Priority: Medium     Long-Range Goal: RNCM: Hypertension Monitored   Start Date: 06/11/2020  Expected End Date: 09/04/2021  This Visit's Progress: On track  Recent Progress: On track  Priority: Medium  Note:   Objective:  Last practice recorded BP readings:  BP Readings from Last 3 Encounters:  11/30/20 (!) 144/83  10/31/20 (!) 152/74  10/04/20 (!) 149/70   Most recent eGFR/CrCl: No results found for: EGFR  No components found for: CRCL Current Barriers:  Knowledge Deficits related to basic understanding of hypertension pathophysiology and self care management Knowledge Deficits related to understanding of medications prescribed for management of hypertension Unable to independently HTN Does not contact provider office for questions/concerns Case Manager Clinical Goal(s):  patient will verbalize understanding of plan for hypertension management patient will attend all scheduled medical appointments: 02-04-2021 at 2 pm patient will demonstrate improved adherence to prescribed treatment plan for hypertension as evidenced by taking all medications as prescribed, monitoring and recording blood pressure as directed, adhering to low sodium/DASH diet patient will demonstrate improved health management independence as evidenced by checking blood pressure as directed and notifying PCP if SBP>160 or DBP > 90, taking all medications as prescribe, and adhering to a low sodium diet as discussed. patient will verbalize basic understanding of hypertension disease process and self health management plan as evidenced by compliance with heart healthy/ADA diet, compliance with medications, and working with the CCM team to manage health and well being Interventions:  Collaboration with Parks Ranger, Devonne Doughty, DO regarding development and update of comprehensive plan of care as evidenced by provider attestation and co-signature Inter-disciplinary care  team collaboration (see longitudinal plan of care) Evaluation of current treatment plan related to hypertension self management and patient's adherence to plan as established by provider. 01-10-2021: The patient states her blood pressures are doing well. Was a little  high when she was dealing with kidney stones and UTI's. States she is glad she is not having any new issues with kidney stones and UTI's at this time. The patient is sleeping well and eating well. Will continue to monitor for new concerns.  Provided education to patient re: stroke prevention, s/s of heart attack and stroke, DASH diet, complications of uncontrolled blood pressure Reviewed medications with patient and discussed importance of compliance. 01-10-2021: The patient is compliant with her medications regimen. Denies any issues with medications.  Discussed plans with patient for ongoing care management follow up and provided patient with direct contact information for care management team Advised patient, providing education and rationale, to monitor blood pressure daily and record, calling PCP for findings outside established parameters.  Reviewed scheduled/upcoming provider appointments including: 02-04-2021 at 2 pm Self-Care Activities: - Self administers medications as prescribed Attends all scheduled provider appointments Calls provider office for new concerns, questions, or BP outside discussed  parameters Checks BP and records as discussed Follows a low sodium diet/DASH diet Patient Goals: - check blood pressure 3 times per week - choose a place to take my blood pressure (home, clinic or office, retail store) - write blood pressure results in a log or diary - agree on reward when goals are met - agree to work together to make changes - ask questions to understand - have a family meeting to talk about healthy habits - learn about high blood pressure  Follow Up Plan: Telephone follow up appointment with care management  team member scheduled for: 03-21-2021 at 1 pm    Care Plan : RNCM: Diabetes Type 2 (Adult)  Updates made by Vanita Ingles, RN since 01/10/2021 12:00 AM     Problem: RNCM: Glycemic Management (Diabetes, Type 2)   Priority: Medium     Long-Range Goal: RNCM: Glycemic Management Optimized   Start Date: 06/11/2020  Expected End Date: 09/04/2021  This Visit's Progress: On track  Recent Progress: On track  Priority: Medium  Note:   Objective:  Lab Results  Component Value Date   HGBA1C 7.0 (A) 08/03/2020    Lab Results  Component Value Date   CREATININE 0.83 01/24/2020   BUN 14 01/24/2020   NA 137 01/24/2020   K 4.3 01/24/2020   CL 103 01/24/2020   CO2 22 01/24/2020     No results found for: EGFR Current Barriers:  Knowledge Deficits related to basic Diabetes pathophysiology and self care/management Knowledge Deficits related to medications used for management of diabetes Does not contact provider office for questions/concerns Case Manager Clinical Goal(s):  patient will demonstrate improved adherence to prescribed treatment plan for diabetes self care/management as evidenced by: daily monitoring and recording of CBG  adherence to ADA/ carb modified diet exercise 4/5 days/week adherence to prescribed medication regimen contacting provider for new or worsened symptoms or questions Interventions:  Collaboration with Olin Hauser, DO regarding development and update of comprehensive plan of care as evidenced by provider attestation and co-signature Inter-disciplinary care team collaboration (see longitudinal plan of care) Provided education to patient about basic DM disease process Reviewed medications with patient and discussed importance of medication adherence. 11-08-2020: The patient is compliant with her medications, denies any changes or needs. Discussed plans with patient for ongoing care management follow up and provided patient with direct contact information for  care management team Provided patient with written educational materials related to hypo and hyperglycemia and importance of correct treatment. 01-10-2021: The patient denies any lows at this time.  Reviewed scheduled/upcoming provider appointments including: 02-04-2021 at 2 pm Advised patient, providing education and rationale, to check cbg bid and record, calling pcp for findings outside established parameters.  The patient states her blood sugars have been around "150".  Education on fasting <130 and post prandial <180.  The patient states hers is sometimes greater than 150 but that is usually when she has eat "bad".  The patient states that she is trying to watch her diet better since the holidays are over. 08-23-2020: The patient states she is doing better with managing  her DM. She sometimes eats things that are not the best for her but she has a better hemoglobin A1C at last appointment in April. She is also more active now that the weather has changed. She enjoys the pretty weather and doing her craft shows once a month. The patient states that she is doing well. 11-08-2020: The patient states that her fasting blood sugars are  around 140-150, review and education on normal parameters. Discussed A1C results and the patient will have new blood work in October. Reviewed of A1C goal of <7.0 to work toward. 01-10-2021: The patient says her blood sugars are consistently at 130 to 140. Denies any highs or lows. Review of blood sugar parameters. Will continue to monitor.  Review of patient status, including review of consultants reports, relevant laboratory and other test results, and medications completed. Patient Goals/Self-Care Activities - Self administers oral medications as prescribed Self administers insulin as prescribed Attends all scheduled provider appointments Checks blood sugars as prescribed and utilize hyper and hypoglycemia protocol as needed Adheres to prescribed ADA/carb modified - barriers  to adherence to treatment plan identified - blood glucose monitoring encouraged - blood glucose readings reviewed - individualized medical nutrition therapy provided - mutual A1C goal set or reviewed - self-awareness of signs/symptoms of hypo or hyperglycemia encouraged - use of blood glucose monitoring log promoted Follow Up Plan: Telephone follow up appointment with care management team member scheduled for: 03-21-2021 at 1 pm     Plan:Telephone follow up appointment with care management team member scheduled for:  03-21-2021 at 1 pm  Noreene Larsson RN, MSN, [place] Mobile: [phone]

## 2021-01-18 DIAGNOSIS — E785 Hyperlipidemia, unspecified: Secondary | ICD-10-CM | POA: Diagnosis not present

## 2021-01-18 DIAGNOSIS — I10 Essential (primary) hypertension: Secondary | ICD-10-CM | POA: Diagnosis not present

## 2021-01-18 DIAGNOSIS — E1169 Type 2 diabetes mellitus with other specified complication: Secondary | ICD-10-CM | POA: Diagnosis not present

## 2021-01-25 ENCOUNTER — Other Ambulatory Visit: Payer: Self-pay

## 2021-01-25 DIAGNOSIS — I10 Essential (primary) hypertension: Secondary | ICD-10-CM

## 2021-01-25 DIAGNOSIS — E663 Overweight: Secondary | ICD-10-CM

## 2021-01-25 DIAGNOSIS — E1169 Type 2 diabetes mellitus with other specified complication: Secondary | ICD-10-CM

## 2021-01-25 DIAGNOSIS — Z0000 Encounter for general adult medical examination without abnormal findings: Secondary | ICD-10-CM

## 2021-01-28 ENCOUNTER — Other Ambulatory Visit: Payer: Self-pay

## 2021-01-28 ENCOUNTER — Other Ambulatory Visit: Payer: Medicare Other

## 2021-01-28 DIAGNOSIS — E1169 Type 2 diabetes mellitus with other specified complication: Secondary | ICD-10-CM | POA: Diagnosis not present

## 2021-01-28 DIAGNOSIS — I10 Essential (primary) hypertension: Secondary | ICD-10-CM | POA: Diagnosis not present

## 2021-01-28 DIAGNOSIS — E785 Hyperlipidemia, unspecified: Secondary | ICD-10-CM | POA: Diagnosis not present

## 2021-01-29 LAB — CBC WITH DIFFERENTIAL/PLATELET
Absolute Monocytes: 403 cells/uL (ref 200–950)
Basophils Absolute: 31 cells/uL (ref 0–200)
Basophils Relative: 0.5 %
Eosinophils Absolute: 205 cells/uL (ref 15–500)
Eosinophils Relative: 3.3 %
HCT: 33.2 % — ABNORMAL LOW (ref 35.0–45.0)
Hemoglobin: 11.3 g/dL — ABNORMAL LOW (ref 11.7–15.5)
Lymphs Abs: 1531 cells/uL (ref 850–3900)
MCH: 30.7 pg (ref 27.0–33.0)
MCHC: 34 g/dL (ref 32.0–36.0)
MCV: 90.2 fL (ref 80.0–100.0)
MPV: 8.9 fL (ref 7.5–12.5)
Monocytes Relative: 6.5 %
Neutro Abs: 4030 cells/uL (ref 1500–7800)
Neutrophils Relative %: 65 %
Platelets: 240 10*3/uL (ref 140–400)
RBC: 3.68 10*6/uL — ABNORMAL LOW (ref 3.80–5.10)
RDW: 12.5 % (ref 11.0–15.0)
Total Lymphocyte: 24.7 %
WBC: 6.2 10*3/uL (ref 3.8–10.8)

## 2021-01-29 LAB — COMPLETE METABOLIC PANEL WITH GFR
AG Ratio: 1.8 (calc) (ref 1.0–2.5)
ALT: 6 U/L (ref 6–29)
AST: 16 U/L (ref 10–35)
Albumin: 4.2 g/dL (ref 3.6–5.1)
Alkaline phosphatase (APISO): 103 U/L (ref 37–153)
BUN: 19 mg/dL (ref 7–25)
CO2: 23 mmol/L (ref 20–32)
Calcium: 9.5 mg/dL (ref 8.6–10.4)
Chloride: 103 mmol/L (ref 98–110)
Creat: 0.81 mg/dL (ref 0.50–1.05)
Globulin: 2.4 g/dL (calc) (ref 1.9–3.7)
Glucose, Bld: 146 mg/dL — ABNORMAL HIGH (ref 65–99)
Potassium: 4.6 mmol/L (ref 3.5–5.3)
Sodium: 136 mmol/L (ref 135–146)
Total Bilirubin: 0.6 mg/dL (ref 0.2–1.2)
Total Protein: 6.6 g/dL (ref 6.1–8.1)
eGFR: 79 mL/min/{1.73_m2} (ref >=60)

## 2021-01-29 LAB — HEMOGLOBIN A1C
Hgb A1c MFr Bld: 6.7 % of total Hgb — ABNORMAL HIGH (ref <5.7)
Mean Plasma Glucose: 146 mg/dL
eAG (mmol/L): 8.1 mmol/L

## 2021-01-29 LAB — LIPID PANEL
Cholesterol: 121 mg/dL (ref <200)
HDL: 45 mg/dL — ABNORMAL LOW (ref >=50)
LDL Cholesterol (Calc): 51 mg/dL (calc)
Non-HDL Cholesterol (Calc): 76 mg/dL (calc) (ref <130)
Total CHOL/HDL Ratio: 2.7 (calc) (ref <5.0)
Triglycerides: 171 mg/dL — ABNORMAL HIGH (ref <150)

## 2021-01-29 LAB — TSH: TSH: 2.49 mIU/L (ref 0.40–4.50)

## 2021-02-04 ENCOUNTER — Ambulatory Visit (INDEPENDENT_AMBULATORY_CARE_PROVIDER_SITE_OTHER): Payer: Medicare Other | Admitting: Family Medicine

## 2021-02-04 ENCOUNTER — Encounter: Payer: Self-pay | Admitting: Family Medicine

## 2021-02-04 ENCOUNTER — Other Ambulatory Visit: Payer: Self-pay

## 2021-02-04 VITALS — BP 141/64 | HR 63 | Ht 62.0 in | Wt 154.6 lb

## 2021-02-04 DIAGNOSIS — E785 Hyperlipidemia, unspecified: Secondary | ICD-10-CM

## 2021-02-04 DIAGNOSIS — Z0000 Encounter for general adult medical examination without abnormal findings: Secondary | ICD-10-CM | POA: Diagnosis not present

## 2021-02-04 DIAGNOSIS — I10 Essential (primary) hypertension: Secondary | ICD-10-CM | POA: Diagnosis not present

## 2021-02-04 DIAGNOSIS — E663 Overweight: Secondary | ICD-10-CM

## 2021-02-04 DIAGNOSIS — E1169 Type 2 diabetes mellitus with other specified complication: Secondary | ICD-10-CM

## 2021-02-04 DIAGNOSIS — Z23 Encounter for immunization: Secondary | ICD-10-CM | POA: Diagnosis not present

## 2021-02-04 NOTE — Assessment & Plan Note (Signed)
Improved A1c to 6.7 No hypoglycemia Complications - other including hyperlipidemia, obesity - increases risk of future cardiovascular complications   Plan:  1. Continue Metformin IR 500mg  BID 2. Encourage improved lifestyle - low carb, low sugar diet, reduce portion size, continue improving regular exercise 3. Check CBG, bring log to next visit for review 4. Continue ASA, ARB, Statin DM Foot normal  Request copy DM Eye Exam [facility] 10/2020

## 2021-02-04 NOTE — Assessment & Plan Note (Signed)
Controlled, mild elevated SBP Home readings normal No known complications  Failed: Lisinopril (ACEi cough)    Plan:  1. Continue current BP regimen Losartan 50mg  daily 2. Encourage improved lifestyle - low sodium diet, regular exercise 3. Continue monitor BP outside office, bring readings to next visit, if persistently >140/90 or new symptoms notify office sooner

## 2021-02-04 NOTE — Assessment & Plan Note (Signed)
Controlled lipids on statin and lifestyle Fam history HyperTG  The ASCVD Risk score (Arnett DK, et al., 2019) failed to calculate for the following reasons:   The valid total cholesterol range is 130 to 320 mg/dL   Plan: 1. Continue Rosuvastatin 10mg  nightly 2. Continue ASA 81mg  for primary ASCVD risk reduction 3. Encourage improved lifestyle - low carb/cholesterol, reduce portion size, continue improving regular exercise

## 2021-02-04 NOTE — Patient Instructions (Addendum)
Thank you for coming to the office today.  COVID19 Booster at pharmacy (updated version, Omicron Variant, Bivalent) - either pfizer or moderna. Any time you are ready. Usually 1-2 weeks after Flu SHot.  Flu Shot today.  Mild anemia, with slightly low hemoglobin we can consider further lab testing next time and even B12 if need   Please schedule a Follow-up Appointment to: Return in about 6 months (around 08/05/2021) for 6 month follow-up DM (POC or Lab A1c), HTN, mild anemia (?labs).  If you have any other questions or concerns, please feel free to call the office or send a message through [place]. You may also schedule an earlier appointment if necessary.  Additionally, you may be receiving a survey about your experience at our office within a few days to 1 week by e-mail or mail. We value your feedback.  [name], DO [place]

## 2021-02-04 NOTE — Progress Notes (Signed)
Subjective:    Patient ID: Andrea Dodson, female    DOB: [birth year], 70 y.o.   MRN: [id number]  Andrea Dodson is a 70 y.o. female presenting on 02/04/2021 for Annual Exam   HPI  Here for Annual Physical and Lab Review.    CHRONIC DM, Type 2 / Overweight BMI >28 Hyperlipidemia   Last A1c improved. CBGs: stable readings Meds: Metformin 51m BID Tolerating well w/o side-effects Currently ARB / ASA daily, on Statin - Rosuvastatin 146mLifestyle: - Weight stable to improved - Diet improved DM diet. Largest mea of day is afternoon/lunch, Occasional sweets - Exercise (walking now goal to resume exercise) - Request copy of Diabetic Eye Exam 10/2020 [facility], she reports no Diabetic Retinopathy. Denies hypoglycemia   CHRONIC HTN: Reports no new concerns. Home BP normal Current Meds - Losartan 5035maily - Prior history w/ ACEi cough on Lisinopril Reports good compliance, took meds today. Tolerating well, w/o complaints.   Low Hemoglobin / Anemia Hemoglobin 11.3 similar to prior 11.4, prior range 2-3 years ago 12-13 range Asymptomatic, no bleeding or other symptoms Taking Aspirin 81    PMH - Seasonal allergies - taking Loratadine 51m87mily, controls her allergy symptoms   Health Maintenance:      Last cologuard 02/15/20 negative.   Due for Flu Shot, will receive today     Mammogram UTD 09/2019 q every other year    UTD Pneumonia vaccine series. Prevnar-13 and Pneumovax-23 previously ages 65 a[age]. 86epression screen PHQ [facility] 01/10/2021 02/21/2020 02/01/2020  Decreased Interest 0 - 0  Down, Depressed, Hopeless 0 0 0  PHQ - 2 Score 0 0 0  Altered sleeping - 0 -  Tired, decreased energy - 0 -  Change in appetite - 0 -  Feeling bad or failure about yourself  - 0 -  Trouble concentrating - 0 -  Moving slowly or fidgety/restless - 0 -  Suicidal thoughts - 0 -  PHQ-9 Score - 0 -  Difficult doing work/chores - Not difficult at all -    Past Medical History:   Diagnosis Date   Allergy    Arthritis    right knee   Diabetes mellitus, type 2 (HCC)[place] Hypertension    Urinary incontinence    Wears dentures    full upper and lower   Past Surgical History:  Procedure Laterality Date   APPENDECTOMY     CATARACT EXTRACTION W/PHACO Left 08/08/2019   Procedure: CATARACT EXTRACTION PHACO AND INTRAOCULAR LENS PLACEMENT (IOC) LEFT 4.59  00:32.3;  Surgeon: KingEulogio Bear;  [facility];  Laterality: Left;  Diabetic - oral meds   CATARACT EXTRACTION W/PHACO Right 09/05/2019   Procedure: CATARACT EXTRACTION PHACO AND INTRAOCULAR LENS PLACEMENT (IOC) RIGHT DIABETIC 4.65  00:34.2;  Surgeon: KingEulogio Bear;  [place] [facility];  Laterality: Right;  Diabetic - oral meds   CHOLECYSTECTOMY  1993   CYSTOSCOPY/URETEROSCOPY/HOLMIUM LASER/STENT PLACEMENT Left 09/22/2020   Procedure: CYSTOSCOPY/URETEROSCOPY/HOLMIUM LASER/STENT PLACEMENT;  Surgeon: EskrFestus Aloe;  Location: [facility] ORS;  Service: Urology;  Laterality: Left;   TONSILLECTOMY     Social History   Socioeconomic History   Marital status: Widowed    Spouse name: Not on file   Number of children: Not on file   Years of education: Not on file   Highest education level: Some college, no degree  Occupational History   Occupation: retired  Tobacco Use  Smoking status: Never   Smokeless tobacco: Never  Vaping Use   Vaping Use: Never used  Substance and Sexual Activity   Alcohol use: No   Drug use: No   Sexual activity: Not on file  Other Topics Concern   Not on file  Social History Narrative   Not on file   Social Determinants of Health   Financial Resource Strain: Low Risk    Difficulty of Paying Living Expenses: Not hard at all  Food Insecurity: No Food Insecurity   Worried About Charity fundraiser in the Last Year: Never true   [place] in the Last Year: Never true  Transportation  Needs: No Transportation Needs   Lack of Transportation (Medical): No   Lack of Transportation (Non-Medical): No  Physical Activity: Inactive   Days of Exercise per Week: 0 days   Minutes of Exercise per Session: 0 min  Stress: No Stress Concern Present   Feeling of Stress : Not at all  Social Connections: Socially Isolated   Frequency of Communication with Friends and Family: More than three times a week   Frequency of Social Gatherings with Friends and Family: More than three times a week   Attends Religious Services: Never   Marine scientist or Organizations: No   Attends Archivist Meetings: Never   Marital Status: Widowed  Human resources officer Violence: Not At Risk   Fear of Current or Ex-Partner: No   Emotionally Abused: No   Physically Abused: No   Sexually Abused: No   Family History  Problem Relation Age of Onset   Heart failure Mother    Heart disease Mother    Diabetes Mother    Heart attack Mother    Cancer Father        lung   Heart attack Sister    Diabetes Sister    Breast cancer Half-Sister    Current Outpatient Medications on File Prior to Visit  Medication Sig   aspirin EC 81 MG tablet Take 1 tablet (81 mg total) by mouth daily.   Blood Glucose Monitoring Suppl (ONE TOUCH ULTRA 2) w/Device KIT 1 device for checking blood sugar once daily   fluticasone (FLONASE) 50 MCG/ACT nasal spray Place 2 sprays into both nostrils daily.   glucose blood (ONE TOUCH ULTRA TEST) test strip CHECK BLOOD SUGAR ONCE DAILY   Lancets (ONETOUCH ULTRASOFT) lancets Use as instructed   loratadine (CLARITIN) 10 MG tablet Take 1 tablet (10 mg total) by mouth daily.   losartan (COZAAR) 50 MG tablet TAKE 1 TABLET BY MOUTH  DAILY   metFORMIN (GLUCOPHAGE) 500 MG tablet TAKE 1 TABLET BY MOUTH  TWICE DAILY WITH A MEAL   naproxen (NAPROSYN) 500 MG tablet Take 1 tablet (500 mg total) by mouth 2 (two) times daily with a meal.   rosuvastatin (CRESTOR) 10 MG tablet TAKE 1 TABLET BY  MOUTH DAILY   traMADol (ULTRAM) 50 MG tablet Take 1 tablet (50 mg total) by mouth every 6 (six) hours as needed.   No current facility-administered medications on file prior to visit.    Review of Systems  Constitutional:  Negative for activity change, appetite change, chills, diaphoresis, fatigue and fever.  HENT:  Negative for congestion and hearing loss.   Eyes:  Negative for visual disturbance.  Respiratory:  Negative for cough, chest tightness, shortness of breath and wheezing.   Cardiovascular:  Negative for chest pain, palpitations and leg swelling.  Gastrointestinal:  Negative for abdominal  pain, constipation, diarrhea, nausea and vomiting.  Genitourinary:  Negative for dysuria, frequency and hematuria.  Musculoskeletal:  Negative for arthralgias and neck pain.  Skin:  Negative for rash.  Neurological:  Negative for dizziness, weakness, light-headedness, numbness and headaches.  Hematological:  Negative for adenopathy.  Psychiatric/Behavioral:  Negative for behavioral problems, dysphoric mood and sleep disturbance.   Per HPI unless specifically indicated above      Objective:    BP (!) 141/64   Pulse 63   Ht _0  (1.575 m)   Wt 154 lb 9.6 oz (70.1 kg)   SpO2 100%   BMI 28.28 kg/m   Wt Readings from Last 3 Encounters:  02/04/21 154 lb 9.6 oz (70.1 kg)  11/30/20 151 lb (68.5 kg)  10/31/20 151 lb 8 oz (68.7 kg)    Physical Exam Vitals and nursing note reviewed.  Constitutional:      General: She is not in acute distress.    Appearance: She is well-developed. She is not diaphoretic.     Comments: Well-appearing, comfortable, cooperative  HENT:     Head: Normocephalic and atraumatic.  Eyes:     General:        Right eye: No discharge.        Left eye: No discharge.     Conjunctiva/sclera: Conjunctivae normal.     Pupils: Pupils are equal, round, and reactive to light.  Neck:     Thyroid: No thyromegaly.  Cardiovascular:     Rate and Rhythm: Normal rate and  regular rhythm.     Pulses: Normal pulses.     Heart sounds: Normal heart sounds. No murmur heard. Pulmonary:     Effort: Pulmonary effort is normal. No respiratory distress.     Breath sounds: Normal breath sounds. No wheezing or rales.  Abdominal:     General: Bowel sounds are normal. There is no distension.     Palpations: Abdomen is soft. There is no mass.     Tenderness: There is no abdominal tenderness.  Musculoskeletal:        General: No tenderness. Normal range of motion.     Cervical back: Normal range of motion and neck supple.     Right lower leg: No edema.     Left lower leg: No edema.     Comments: Upper / Lower Extremities: - Normal muscle tone, strength bilateral upper extremities 5/5, lower extremities 5/5  Lymphadenopathy:     Cervical: No cervical adenopathy.  Skin:    General: Skin is warm and dry.     Findings: No erythema or rash.  Neurological:     Mental Status: She is alert and oriented to person, place, and time.     Comments: Distal sensation intact to light touch all extremities  Psychiatric:        Mood and Affect: Mood normal.        Behavior: Behavior normal.        Thought Content: Thought content normal.     Comments: Well groomed, good eye contact, normal speech and thoughts    Diabetic Foot Exam - Simple   Simple Foot Form Diabetic Foot exam was performed with the following findings: Yes 02/04/2021  2:30 PM  Visual Inspection No deformities, no ulcerations, no other skin breakdown bilaterally: Yes Sensation Testing Intact to touch and monofilament testing bilaterally: Yes Pulse Check Posterior Tibialis and Dorsalis pulse intact bilaterally: Yes Comments      Results for orders placed or performed in visit on  01/25/21  TSH  Result Value Ref Range   TSH 2.49 0.40 - 4.50 mIU/L  Lipid panel  Result Value Ref Range   Cholesterol 121 <200 mg/dL   HDL 45 (L) > OR = 50 mg/dL   Triglycerides 171 (H) <150 mg/dL   LDL Cholesterol (Calc)  51 mg/dL (calc)   Total CHOL/HDL Ratio 2.7 <5.0 (calc)   Non-HDL Cholesterol (Calc) 76 <130 mg/dL (calc)  COMPLETE METABOLIC PANEL WITH GFR  Result Value Ref Range   Glucose, Bld 146 (H) 65 - 99 mg/dL   BUN 19 7 - 25 mg/dL   Creat 0.81 0.50 - 1.05 mg/dL   eGFR 79 > OR = 60 mL/min/1.73m   BUN/Creatinine Ratio NOT APPLICABLE 6 - 22 (calc)   Sodium 136 135 - 146 mmol/L   Potassium 4.6 3.5 - 5.3 mmol/L   Chloride 103 98 - 110 mmol/L   CO2 23 20 - 32 mmol/L   Calcium 9.5 8.6 - 10.4 mg/dL   Total Protein 6.6 6.1 - 8.1 g/dL   Albumin 4.2 3.6 - 5.1 g/dL   Globulin 2.4 1.9 - 3.7 g/dL (calc)   AG Ratio 1.8 1.0 - 2.5 (calc)   Total Bilirubin 0.6 0.2 - 1.2 mg/dL   Alkaline phosphatase (APISO) 103 37 - 153 U/L   AST 16 10 - 35 U/L   ALT 6 6 - 29 U/L  CBC with Differential/Platelet  Result Value Ref Range   WBC 6.2 3.8 - 10.8 Thousand/uL   RBC 3.68 (L) 3.80 - 5.10 Million/uL   Hemoglobin 11.3 (L) 11.7 - 15.5 g/dL   HCT 33.2 (L) 35.0 - 45.0 %   MCV 90.2 80.0 - 100.0 fL   MCH 30.7 27.0 - 33.0 pg   MCHC 34.0 32.0 - 36.0 g/dL   RDW 12.5 11.0 - 15.0 %   Platelets 240 140 - 400 Thousand/uL   MPV 8.9 7.5 - 12.5 fL   Neutro Abs 4,030 1,500 - 7,800 cells/uL   Lymphs Abs 1,531 850 - 3,900 cells/uL   Absolute Monocytes 403 200 - 950 cells/uL   Eosinophils Absolute 205 15 - 500 cells/uL   Basophils Absolute 31 0 - 200 cells/uL   Neutrophils Relative % 65 %   Total Lymphocyte 24.7 %   Monocytes Relative 6.5 %   Eosinophils Relative 3.3 %   Basophils Relative 0.5 %  Hemoglobin A1c  Result Value Ref Range   Hgb A1c MFr Bld 6.7 (H) <5.7 % of total Hgb   Mean Plasma Glucose 146 mg/dL   eAG (mmol/L) 8.1 mmol/L      Assessment & Plan:   Problem List Items Addressed This Visit     Overweight (BMI 25.0-29.9)   Hyperlipidemia associated with type 2 diabetes mellitus [place]    Controlled lipids on statin and lifestyle Fam history HyperTG  The ASCVD Risk score (Arnett DK, et al., 2019) failed  to calculate for the following reasons:   The valid total cholesterol range is 130 to 320 mg/dL   Plan: 1. Continue Rosuvastatin 135mnightly 2. Continue ASA 8136mor primary ASCVD risk reduction 3. Encourage improved lifestyle - low carb/cholesterol, reduce portion size, continue improving regular exercise      Essential hypertension    Controlled, mild elevated SBP Home readings normal No known complications  Failed: Lisinopril (ACEi cough)    Plan:  1. Continue current BP regimen Losartan 80m67mily 2. Encourage improved lifestyle - low sodium diet, regular exercise 3. Continue monitor BP outside  office, bring readings to next visit, if persistently >140/90 or new symptoms notify office sooner      Diabetes mellitus, type 2 ([place])    Improved A1c to 6.7 No hypoglycemia Complications - other including hyperlipidemia, obesity - increases risk of future cardiovascular complications   Plan:  1. Continue Metformin IR 556m BID 2. Encourage improved lifestyle - low carb, low sugar diet, reduce portion size, continue improving regular exercise 3. Check CBG, bring log to next visit for review 4. Continue ASA, ARB, Statin DM Foot normal  Request copy DM Eye Exam [facility] 10/2020      Other Visit Diagnoses     Annual physical exam    -  Primary   Needs flu shot       Relevant Orders   Flu Vaccine QUAD High Dose(Fluad) (Completed)       Updated Health Maintenance information Flu Shot today Future COVID Omicron Booster Future Shingrix after 04/2021 Reviewed recent lab results with patient Encouraged improvement to lifestyle with diet and exercise Goal of weight loss   No orders of the defined types were placed in this encounter.     Follow up plan: Return in about 6 months (around 08/05/2021) for 6 month follow-up DM (POC or Lab A1c), HTN, mild anemia (?labs).  ANobie Putnam [facility] 02/04/2021, 2:18  PM

## 2021-02-21 ENCOUNTER — Ambulatory Visit: Payer: Medicare Other

## 2021-02-26 ENCOUNTER — Ambulatory Visit (INDEPENDENT_AMBULATORY_CARE_PROVIDER_SITE_OTHER): Payer: Medicare Other | Admitting: Family Medicine

## 2021-02-26 DIAGNOSIS — Z0000 Encounter for general adult medical examination without abnormal findings: Secondary | ICD-10-CM

## 2021-02-26 NOTE — Patient Instructions (Signed)
Andrea Dodson , Thank you for taking time to come for your Medicare Wellness Visit. I appreciate your ongoing commitment to your health goals. Please review the following plan we discussed and let me know if I can assist you in the future.   Screening recommendations/referrals: Colonoscopy: up to date Mammogram: up to date Bone Density: Education provided Recommended yearly ophthalmology/optometry visit for glaucoma screening and checkup Recommended yearly dental visit for hygiene and checkup  Vaccinations: Influenza vaccine: up to date Pneumococcal vaccine: up to date Tdap vaccine: up to date Shingles vaccine: Education provided    Advanced directives: Education provided  Conditions/risks identified:   Next appointment: 4--17-2023 @ 10:40  [place] 65 Years and Older, Female Preventive care refers to lifestyle choices and visits with your health care provider that can promote health and wellness. What does preventive care include? A yearly physical exam. This is also called an annual well check. Dental exams once or twice a year. Routine eye exams. Ask your health care provider how often you should have your eyes checked. Personal lifestyle choices, including: Daily care of your teeth and gums. Regular physical activity. Eating a healthy diet. Avoiding tobacco and drug use. Limiting alcohol use. Practicing safe sex. Taking low-dose aspirin every day. Taking vitamin and mineral supplements as recommended by your health care provider. What happens during an annual well check? The services and screenings done by your health care provider during your annual well check will depend on your age, overall health, lifestyle risk factors, and family history of disease. Counseling  Your health care provider may ask you questions about your: Alcohol use. Tobacco use. Drug use. Emotional well-being. Home and relationship well-being. Sexual activity. Eating  habits. History of falls. Memory and ability to understand (cognition). Work and work Statistician. Reproductive health. Screening  You may have the following tests or measurements: Height, weight, and BMI. Blood pressure. Lipid and cholesterol levels. These may be checked every 5 years, or more frequently if you are over 4 years old. Skin check. Lung cancer screening. You may have this screening every year starting at age 66 if you have a 30-pack-year history of smoking and currently smoke or have quit within the past 15 years. Fecal occult blood test (FOBT) of the stool. You may have this test every year starting at age 31. Flexible sigmoidoscopy or colonoscopy. You may have a sigmoidoscopy every 5 years or a colonoscopy every 10 years starting at age 87. Hepatitis C blood test. Hepatitis B blood test. Sexually transmitted disease (STD) testing. Diabetes screening. This is done by checking your blood sugar (glucose) after you have not eaten for a while (fasting). You may have this done every 1-3 years. Bone density scan. This is done to screen for osteoporosis. You may have this done starting at age 43. Mammogram. This may be done every 1-2 years. Talk to your health care provider about how often you should have regular mammograms. Talk with your health care provider about your test results, treatment options, and if necessary, the need for more tests. Vaccines  Your health care provider may recommend certain vaccines, such as: Influenza vaccine. This is recommended every year. Tetanus, diphtheria, and acellular pertussis (Tdap, Td) vaccine. You may need a Td booster every 10 years. Zoster vaccine. You may need this after age 64. Pneumococcal 13-valent conjugate (PCV13) vaccine. One dose is recommended after age 75. Pneumococcal polysaccharide (PPSV23) vaccine. One dose is recommended after age 68. Talk to your health care  provider about which screenings and vaccines you need and how  often you need them. This information is not intended to replace advice given to you by your health care provider. Make sure you discuss any questions you have with your health care provider. Document Released: 05/04/2015 Document Revised: 12/26/2015 Document Reviewed: 02/06/2015 Elsevier Interactive Patient Education  2017 [place] Prevention in the Home Falls can cause injuries. They can happen to people of all ages. There are many things you can do to make your home safe and to help prevent falls. What can I do on the outside of my home? Regularly fix the edges of walkways and driveways and fix any cracks. Remove anything that might make you trip as you walk through a door, such as a raised step or threshold. Trim any bushes or trees on the path to your home. Use bright outdoor lighting. Clear any walking paths of anything that might make someone trip, such as rocks or tools. Regularly check to see if handrails are loose or broken. Make sure that both sides of any steps have handrails. Any raised decks and porches should have guardrails on the edges. Have any leaves, snow, or ice cleared regularly. Use sand or salt on walking paths during winter. Clean up any spills in your garage right away. This includes oil or grease spills. What can I do in the bathroom? Use night lights. Install grab bars by the toilet and in the tub and shower. Do not use towel bars as grab bars. Use non-skid mats or decals in the tub or shower. If you need to sit down in the shower, use a plastic, non-slip stool. Keep the floor dry. Clean up any water that spills on the floor as soon as it happens. Remove soap buildup in the tub or shower regularly. Attach bath mats securely with double-sided non-slip rug tape. Do not have throw rugs and other things on the floor that can make you trip. What can I do in the bedroom? Use night lights. Make sure that you have a light by your bed that is easy to  reach. Do not use any sheets or blankets that are too big for your bed. They should not hang down onto the floor. Have a firm chair that has side arms. You can use this for support while you get dressed. Do not have throw rugs and other things on the floor that can make you trip. What can I do in the kitchen? Clean up any spills right away. Avoid walking on wet floors. Keep items that you use a lot in easy-to-reach places. If you need to reach something above you, use a strong step stool that has a grab bar. Keep electrical cords out of the way. Do not use floor polish or wax that makes floors slippery. If you must use wax, use non-skid floor wax. Do not have throw rugs and other things on the floor that can make you trip. What can I do with my stairs? Do not leave any items on the stairs. Make sure that there are handrails on both sides of the stairs and use them. Fix handrails that are broken or loose. Make sure that handrails are as long as the stairways. Check any carpeting to make sure that it is firmly attached to the stairs. Fix any carpet that is loose or worn. Avoid having throw rugs at the top or bottom of the stairs. If you do have throw rugs, attach them to the  floor with carpet tape. Make sure that you have a light switch at the top of the stairs and the bottom of the stairs. If you do not have them, ask someone to add them for you. What else can I do to help prevent falls? Wear shoes that: Do not have high heels. Have rubber bottoms. Are comfortable and fit you well. Are closed at the toe. Do not wear sandals. If you use a stepladder: Make sure that it is fully opened. Do not climb a closed stepladder. Make sure that both sides of the stepladder are locked into place. Ask someone to hold it for you, if possible. Clearly mark and make sure that you can see: Any grab bars or handrails. First and last steps. Where the edge of each step is. Use tools that help you move  around (mobility aids) if they are needed. These include: Canes. Walkers. Scooters. Crutches. Turn on the lights when you go into a dark area. Replace any light bulbs as soon as they burn out. Set up your furniture so you have a clear path. Avoid moving your furniture around. If any of your floors are uneven, fix them. If there are any pets around you, be aware of where they are. Review your medicines with your doctor. Some medicines can make you feel dizzy. This can increase your chance of falling. Ask your doctor what other things that you can do to help prevent falls. This information is not intended to replace advice given to you by your health care provider. Make sure you discuss any questions you have with your health care provider. Document Released: 02/01/2009 Document Revised: 09/13/2015 Document Reviewed: 05/12/2014 Elsevier Interactive Patient Education  2017 [employer].

## 2021-02-26 NOTE — Progress Notes (Signed)
Subjective:   Andrea Dodson is a 70 y.o. female who presents for Medicare Annual (Subsequent) preventive examination.  I connected with  Jerilynn Som on 02/26/21 by a telephone enabled telemedicine application and verified that I am speaking with the correct person using two identifiers.   I discussed the limitations of evaluation and management by telemedicine. The patient expressed understanding and agreed to proceed.    Review of Systems    no Cardiac Risk Factors include: advanced age (>66mn, >>28women);diabetes mellitus;hypertension     Objective:    Today's Vitals   There is no height or weight on file to calculate BMI.  Advanced Directives 02/26/2021 09/22/2020 02/21/2020 09/05/2019 08/08/2019 03/30/2019 02/09/2018  Does Patient Have a Medical Advance Directive? No No No Yes No No No  Type of Advance Directive - - - [place] Does patient want to make changes to medical advance directive? - - - Yes (MAU/Ambulatory/Procedural Areas - Information given) - - Yes (MAU/Ambulatory/Procedural Areas - Information given)  Would patient like information on creating a medical advance directive? No - Patient declined No - Patient declined - - Yes (MAU/Ambulatory/Procedural Areas - Information given) - -    Current Medications (verified) Outpatient Encounter Medications as of 02/26/2021  Medication Sig   aspirin EC 81 MG tablet Take 1 tablet (81 mg total) by mouth daily.   Blood Glucose Monitoring Suppl (ONE TOUCH ULTRA 2) w/Device KIT 1 device for checking blood sugar once daily   fluticasone (FLONASE) 50 MCG/ACT nasal spray Place 2 sprays into both nostrils daily.   glucose blood (ONE TOUCH ULTRA TEST) test strip CHECK BLOOD SUGAR ONCE DAILY   Lancets (ONETOUCH ULTRASOFT) lancets Use as instructed   loratadine (CLARITIN) 10 MG tablet Take 1 tablet (10 mg total) by mouth daily.   losartan (COZAAR) 50 MG tablet TAKE 1 TABLET BY MOUTH  DAILY   metFORMIN  (GLUCOPHAGE) 500 MG tablet TAKE 1 TABLET BY MOUTH  TWICE DAILY WITH A MEAL   naproxen (NAPROSYN) 500 MG tablet Take 1 tablet (500 mg total) by mouth 2 (two) times daily with a meal.   rosuvastatin (CRESTOR) 10 MG tablet TAKE 1 TABLET BY MOUTH DAILY   traMADol (ULTRAM) 50 MG tablet Take 1 tablet (50 mg total) by mouth every 6 (six) hours as needed.   No facility-administered encounter medications on file as of 02/26/2021.    Allergies (verified) Codeine and Lisinopril   History: Past Medical History:  Diagnosis Date   Allergy    Arthritis    right knee   Di[place] 2 [place]    Hypertension    Urinary incontinence    Wears dentures    full upper and lower   Past Surgical History:  Procedure Laterality Date   APPENDECTOMY     CATARACT EXTRACTION W/PHACO Left 08/08/2019   Procedure: CATARACT EXTRACTION PHACO AND INTRAOCULAR LENS PLACEMENT (IOC) LEFT 4.59  00:32.3;  Surgeon: KEulogio Bear MD;  [facility]  Service: Ophthalmology;  Laterality: Left;  Diabetic - oral meds   CATARACT EXTRACTION W/PHACO Right 09/05/2019   Procedure: CATARACT EXTRACTION PHACO AND INTRAOCULAR LENS PLACEMENT (IOC) RIGHT DIABETIC 4.65  00:34.2;  Surgeon: KEulogio Bear MD;  [facility]  Service: Ophthalmology;  Laterality: Right;  Diabetic - oral meds   CHOLECYSTECTOMY  1993   CYSTOSCOPY/URETEROSCOPY/HOLMIUM LASER/STENT PLACEMENT Left 09/22/2020   Procedure: CYSTOSCOPY/URETEROSCOPY/HOLMIUM LASER/STENT PLACEMENT;  Surgeon: EFestus Aloe MD;  Location: [facility];  Service: Urology;  Laterality: Left;   TONSILLECTOMY     Family History  Problem Relation Age of Onset   Heart failure Mother    Heart disease Mother    Diabetes Mother    Heart attack Mother    Cancer Father        lung   Heart attack Sister    Diabetes Sister    Breast cancer Half-Sister    Social History   Socioeconomic History   Marital status: Widowed    Spouse name: Not on  file   Number of children: Not on file   Years of education: Not on file   Highest education level: Some college, no degree  Occupational History   Occupation: retired  Tobacco Use   Smoking status: Never   Smokeless tobacco: Never  Vaping Use   Vaping Use: Never used  Substance and Sexual Activity   Alcohol use: No   Drug use: No   Sexual activity: Not Currently  Other Topics Concern   Not on file  Social History Narrative   Not on file   Social Determinants of Health   Financial Resource Strain: Low Risk    Difficulty of Paying Living Expenses: Not hard at all  Food Insecurity: No Food Insecurity   Worried About Charity fundraiser in the Last Year: Never true   [place] in the Last Year: Never true  Transportation Needs: No Transportation Needs   Lack of Transportation (Medical): No   Lack of Transportation (Non-Medical): No  Physical Activity: Inactive   Days of Exercise per Week: 0 days   Minutes of Exercise per Session: 0 min  Stress: No Stress Concern Present   Feeling of Stress : Not at all  Social Connections: Moderately Isolated   Frequency of Communication with Friends and Family: More than three times a week   Frequency of Social Gatherings with Friends and Family: More than three times a week   Attends Religious Services: Never   Marine scientist or Organizations: Yes   Attends Archivist Meetings: 1 to 4 times per year   Marital Status: Widowed    Tobacco Counseling Counseling given: Not Answered   Clinical Intake:  Pre-visit preparation completed: Yes  Pain : No/denies pain     Nutritional Risks: None Diabetes: Yes CBG done?: No Did pt. bring in CBG monitor from home?: No  How often do you need to have someone help you when you read instructions, pamphlets, or other written materials from your doctor or pharmacy?: 1 - Never  Diabetic?yes    Nutrition Risk Assessment:  Has the patient had any N/V/D within the last  2 months?  No  Does the patient have any non-healing wounds?  No  Has the patient had any unintentional weight loss or weight gain?  No   Diabetes:  Is the patient diabetic?  Yes  If diabetic, was a CBG obtained today?  No  Did the patient bring in their glucometer from home?  No  How often do you monitor your CBG's? 1 x dily.   Financial Strains and Diabetes Management:  Are you having any financial strains with the device, your supplies or your medication? No .  Does the patient want to be seen by Chronic Care Management for management of their diabetes?  No  Would the patient like to be referred to a Nutritionist or for Diabetic Management?  No   Diabetic Exams:  Diabetic Eye Exam:  Completed . Overdue for diabetic eye exam. Pt has been advised about the importance in completing this exam. A referral has been placed today.  Diabetic Foot Exam: . Pt has been advised about the importance in completing this exam.   Interpreter Needed?: No  Information entered by :: Leroy Kennedy LPN   Activities of Daily Living In your present state of health, do you have any difficulty performing the following activities: 02/26/2021 09/22/2020  Hearing? N N  Vision? N N  Difficulty concentrating or making decisions? N N  Walking or climbing stairs? N N  Dressing or bathing? N N  Doing errands, shopping? N N  Preparing Food and eating ? N -  Using the Toilet? N -  In the past six months, have you accidently leaked urine? N -  Do you have problems with loss of bowel control? N -  Managing your Medications? N -  Managing your Finances? N -  Housekeeping or managing your Housekeeping? N -  Some recent data might be hidden    Patient Care Team: Olin Hauser, DO as PCP - General (Family Medicine) Vanita Ingles, RN as Case Manager (General Practice)  Indicate any recent Medical Services you may have received from other than [facility] providers in the past year (date may be  approximate).     Assessment:   This is a routine wellness examination for Barry.  Hearing/Vision screen Hearing Screening - Comments:: No trouble hearing  Dietary issues and exercise activities discussed: Current Exercise Habits: Home exercise routine, Type of exercise: stretching, Time (Minutes): 20, Frequency (Times/Week): 4, Weekly Exercise (Minutes/Week): 80, Intensity: Mild   Goals Addressed             This Visit's Progress    Weight (lb) < 200 lb (90.7 kg)       Loose 5- 10 lbs        Depression Screen PHQ 2/9 Scores 02/26/2021 01/10/2021 02/21/2020 02/01/2020 07/25/2019 07/21/2019 02/15/2019  PHQ - 2 Score 0 0 0 0 0 0 0  PHQ- 9 Score - - 0 - - - -    Fall Risk Fall Risk  02/26/2021 02/21/2020 02/01/2020 07/25/2019 02/15/2019  Falls in the past year? 0 0 0 0 0  Number falls in past yr: 0 - 0 0 0  Injury with Fall? 0 - 0 0 0  Risk for fall due to : - Medication side effect - - -  Follow up Falls evaluation completed;Falls prevention discussed Falls evaluation completed;Education provided;Falls prevention discussed Falls evaluation completed Falls evaluation completed -    FALL RISK PREVENTION PERTAINING TO THE HOME:   Any stairs in or around the home? Yes  If so, are there any without handrails? No  Home free of loose throw rugs in walkways, pet beds, electrical cords, etc? Yes  Adequate lighting in your home to reduce risk of falls? Yes   ASSISTIVE DEVICES UTILIZED TO PREVENT FALLS:  Life alert? No  Use of a cane, walker or w/c? No  Grab bars in the bathroom? Yes  Shower chair or bench in shower? Yes  Elevated toilet seat or a handicapped toilet? No   TIMED UP AND GO:  Was the test performed? No .    Cognitive Function:   Normal cognitive status assessed by direct observation by this Nurse Health Advisor. No abnormalities found.     6CIT Screen 02/21/2020 02/09/2018  What Year? 0 points 0 points  What month? 0 points 0 points  What  time? 0 points 0  points  Count back from 20 0 points 0 points  Months in reverse 0 points 0 points  Repeat phrase 0 points 0 points  Total Score 0 0    Immunizations Immunization History  Administered Date(s) Administered   Fluad Quad(high Dose 65+) 02/01/2019, 02/01/2020, 02/04/2021   Influenza, High Dose Seasonal PF 12/30/2016, 02/09/2018   Moderna Sars-Covid-2 Vaccination 09/19/2019, 10/17/2019, 04/17/2020   Pneumococcal Conjugate-13 07/11/2016   Pneumococcal Polysaccharide-23 08/10/2017   Tdap 07/11/2016    TDAP status: Up to date  Flu Vaccine status: Up to date  Pneumococcal vaccine status: Up to date  Covid-19 vaccine status: Information provided on how to obtain vaccines.   Qualifies for Shingles Vaccine? Yes   Zostavax completed No   Shingrix Completed?: No.    Education has been provided regarding the importance of this vaccine. Patient has been advised to call insurance company to determine out of pocket expense if they have not yet received this vaccine. Advised may also receive vaccine at local pharmacy or Health Dept. Verbalized acceptance and understanding.  Screening Tests Health Maintenance  Topic Date Due   Zoster Vaccines- Shingrix (1 of 2) Never done   COVID-19 Vaccine (4 - Booster for Moderna series) 06/12/2020   HEMOGLOBIN A1C  07/29/2021   MAMMOGRAM  10/05/2021   OPHTHALMOLOGY EXAM  10/24/2021   FOOT EXAM  02/04/2022   Fecal DNA (Cologuard)  02/15/2023   TETANUS/TDAP  07/12/2026   Pneumonia Vaccine 39+ Years old  Completed   INFLUENZA VACCINE  Completed   DEXA SCAN  Completed   Hepatitis C Screening  Completed   HPV VACCINES  Aged Out    Health Maintenance  Health Maintenance Due  Topic Date Due   Zoster Vaccines- Shingrix (1 of 2) Never done   COVID-19 Vaccine (4 - Booster for Moderna series) 06/12/2020    Colorectal cancer screening: Type of screening: Cologuard. Completed 2021. Repeat every 3 years  Mammogram status: Completed  . Repeat every  year  Bone Density  will schedule next year with mammogram  Lung Cancer Screening: (Low Dose CT Chest recommended if Age 85-80 years, 30 pack-year currently smoking OR have quit w/in 15years.) does not qualify.   Lung Cancer Screening Referral:   Additional Screening:  Hepatitis C Screening: does not qualify; Completed   Vision Screening: Recommended annual ophthalmology exams for early detection of glaucoma and other disorders of the eye. Is the patient up to date with their annual eye exam?  Yes  Who is the provider or what is the name of the office in which the patient attends annual eye exams? [facility] If pt is not established with a provider, would they like to be referred to a provider to establish care? No .   Dental Screening: Recommended annual dental exams for proper oral hygiene  Community Resource Referral / Chronic Care Management: CRR required this visit?  No   CCM required this visit?  No      Plan:     I have personally reviewed and noted the following in the patient's chart:   Medical and social history Use of alcohol, tobacco or illicit drugs  Current medications and supplements including opioid prescriptions.  Functional ability and status Nutritional status Physical activity Advanced directives List of other physicians Hospitalizations, surgeries, and ER visits in previous 12 months Vitals Screenings to include cognitive, depression, and falls Referrals and appointments  In addition, I have reviewed and discussed with patient certain preventive  protocols, quality metrics, and best practice recommendations. A written personalized care plan for preventive services as well as general preventive health recommendations were provided to patient.     Leroy Kennedy, LPN   [id number]   Nurse Notes:

## 2021-03-04 NOTE — Progress Notes (Signed)
I connected with  Jerilynn Som on 02-26-2021 by a telephone enabled telemedicine application and verified that I am speaking with the correct person using two identifiers.   I discussed the limitations of evaluation and management by telemedicine. The patient expressed understanding and agreed to proceed.  Patient location: home  Provider location: tele-health visit not in office

## 2021-03-21 ENCOUNTER — Telehealth: Payer: Medicare Other | Admitting: General Practice

## 2021-03-21 ENCOUNTER — Ambulatory Visit (INDEPENDENT_AMBULATORY_CARE_PROVIDER_SITE_OTHER): Payer: Medicare Other

## 2021-03-21 DIAGNOSIS — I10 Essential (primary) hypertension: Secondary | ICD-10-CM

## 2021-03-21 DIAGNOSIS — E1169 Type 2 diabetes mellitus with other specified complication: Secondary | ICD-10-CM

## 2021-03-21 NOTE — Patient Instructions (Signed)
Visit Information  Thank you for taking time to visit with me today. Please don't hesitate to contact me if I can be of assistance to you before our next scheduled telephone appointment.  Following are the goals we discussed today:  RNCM Clinical Goal(s):  Patient will verbalize basic understanding of HTN, HLD, and DMII disease process and self health management plan as evidenced by verbalizing understanding, asking for clarification when not understanding the plan of care and working with the pcp and CCM team to optimize health and well being take all medications exactly as prescribed and will call provider for medication related questions as evidenced by compliance with medications and calling for refill needs before running out of medications     attend all scheduled medical appointments: 08-05-2021 at 1040 am as evidenced by keeping appointments and calling for schedule change needs         demonstrate improved and ongoing adherence to prescribed treatment plan for HTN, HLD, and DMII as evidenced by keeping appointments, following dietary restrictions, taking medications, and calling the office for changes  demonstrate ongoing self health care management ability for effective management of chronic conditions  as evidenced by  working with the CCM team through collaboration with Consulting civil engineer, provider, and care team.    Interventions: 1:1 collaboration with primary care provider regarding development and update of comprehensive plan of care as evidenced by provider attestation and co-signature Inter-disciplinary care team collaboration (see longitudinal plan of care) Evaluation of current treatment plan related to  self management and patient's adherence to plan as established by provider     Diabetes:  (Status: Goal on Track (progressing): YES.) Long Term Goal         Lab Results  Component Value Date    HGBA1C 6.7 (H) 01/28/2021  Assessed patient's understanding of A1c goal:  <7% Provided education to patient about basic DM disease process; Reviewed medications with patient and discussed importance of medication adherence;        Reviewed prescribed diet with patient heart healthy/ADA; Counseled on importance of regular laboratory monitoring as prescribed;        Discussed plans with patient for ongoing care management follow up and provided patient with direct contact information for care management team;      Provided patient with written educational materials related to hypo and hyperglycemia and importance of correct treatment;       Reviewed scheduled/upcoming provider appointments including: 08-05-2021 at 1040 am;         Advised patient, providing education and rationale, to check cbg when you have symptoms of low or high blood sugar, before and after exercise, and as directed   and record        call provider for findings outside established parameters;       Review of patient status, including review of consultants reports, relevant laboratory and other test results, and medications completed;       Screening for signs and symptoms of depression related to chronic disease state;        Assessed social determinant of health barriers;          Hyperlipidemia:  (Status: Goal on Track (progressing): YES.) Long Term Goal       Lab Results  Component Value Date    CHOL 121 01/28/2021    HDL 45 (L) 01/28/2021    LDLCALC 51 01/28/2021    TRIG 171 (H) 01/28/2021    CHOLHDL 2.7 01/28/2021      Medication  review performed; medication list updated in electronic medical record.  Provider established cholesterol goals reviewed; Counseled on importance of regular laboratory monitoring as prescribed; Provided HLD educational materials; Reviewed role and benefits of statin for ASCVD risk reduction; Discussed strategies to manage statin-induced myalgias; Reviewed importance of limiting foods high in cholesterol;   Hypertension: (Status: Goal on Track (progressing):  YES.) Last practice recorded BP readings:     BP Readings from Last 3 Encounters:  02/04/21 (!) 141/64  11/30/20 (!) 144/83  10/31/20 (!) 152/74  Most recent eGFR/CrCl:       Lab Results  Component Value Date    EGFR 79 01/28/2021    No components found for: CRCL   Evaluation of current treatment plan related to hypertension self management and patient's adherence to plan as established by provider;   Provided education to patient re: stroke prevention, s/s of heart attack and stroke; Reviewed prescribed diet heart healthy/ADA diet  Reviewed medications with patient and discussed importance of compliance;  Discussed plans with patient for ongoing care management follow up and provided patient with direct contact information for care management team; Advised patient, providing education and rationale, to monitor blood pressure daily and record, calling PCP for findings outside established parameters;  Advised patient to discuss blood pressure trends  with provider; Provided education on prescribed diet heart healthy/ADA;  Discussed complications of poorly controlled blood pressure such as heart disease, stroke, circulatory complications, vision complications, kidney impairment, sexual dysfunction;    Patient Goals/Self-Care Activities: Take medications as prescribed   Attend all scheduled provider appointments Call pharmacy for medication refills 3-7 days in advance of running out of medications Attend church or other social activities Perform all self care activities independently  Perform IADL's (shopping, preparing meals, housekeeping, managing finances) independently Call provider office for new concerns or questions  Work with the social worker to address care coordination needs and will continue to work with the clinical team to address health care and disease management related needs call the Suicide and Crisis Lifeline: 988 call the [place] National Suicide Prevention Lifeline:  (305)020-0884 or TTY: 951-391-6579 TTY (817) 286-4916) to talk to a trained counselor call 1-800-273-TALK (toll free, 24 hour hotline) if experiencing a Mental Health or [place]  schedule appointment with eye doctor check blood sugar at prescribed times: before meals and at bedtime, when you have symptoms of low or high blood sugar, and before and after exercise check feet daily for cuts, sores or redness enter blood sugar readings and medication or insulin into daily log take the blood sugar log to all doctor visits trim toenails straight across drink 6 to 8 glasses of water each day eat fish at least once per week fill half of plate with vegetables limit fast food meals to no more than 1 per week manage portion size prepare main meal at home 3 to 5 days each week read food labels for fat, fiber, carbohydrates and portion size keep feet up while sitting wash and dry feet carefully every day wear comfortable, cotton socks wear comfortable, well-fitting shoes check blood pressure weekly choose a place to take my blood pressure (home, clinic or office, retail store) write blood pressure results in a log or diary learn about high blood pressure keep a blood pressure log take blood pressure log to all doctor appointments call doctor for signs and symptoms of high blood pressure develop an action plan for high blood pressure keep all doctor appointments take medications for blood pressure exactly as  prescribed report new symptoms to your doctor eat more whole grains, fruits and vegetables, lean meats and healthy fats - call for medicine refill 2 or 3 days before it runs out - take all medications exactly as prescribed - call doctor with any symptoms you believe are related to your medicine - call doctor when you experience any new symptoms - go to all doctor appointments as scheduled - adhere to prescribed diet: heart healthy/ADA diet       Our next appointment is  by telephone on 05-16-2021  at 1 pm  Please call the care guide team at [phone] if you need to cancel or reschedule your appointment.   If you are experiencing a Mental Health or [place] or need someone to talk to, please call the Suicide and Crisis Lifeline: 988 call the [place] National Suicide Prevention Lifeline: (340)869-4919 or TTY: 850-244-9289 TTY (506)136-9108) to talk to a trained counselor call 1-800-273-TALK (toll free, 24 hour hotline)   Patient verbalizes understanding of instructions provided today and agrees to view in [place].   Noreene Larsson RN, MSN, [place] Mobile: [phone]

## 2021-03-21 NOTE — Chronic Care Management (AMB) (Signed)
Chronic Care Management   CCM RN Visit Note  03/21/2021 Name: Andrea Dodson MRN: [id number] DOB: Apr 25, 1950  Subjective: Andrea Dodson is a 70 y.o. year old female who is a primary care patient of Olin Hauser, DO. The care management team was consulted for assistance with disease management and care coordination needs.    Engaged with patient by telephone for follow up visit in response to provider referral for case management and/or care coordination services.   Consent to Services:  The patient was given information about Chronic Care Management services, agreed to services, and gave verbal consent prior to initiation of services.  Please see initial visit note for detailed documentation.   Patient agreed to services and verbal consent obtained.   Assessment: Review of patient past medical history, allergies, medications, health status, including review of consultants reports, laboratory and other test data, was performed as part of comprehensive evaluation and provision of chronic care management services.   SDOH (Social Determinants of Health) assessments and interventions performed:    CCM Care Plan  Allergies  Allergen Reactions   Codeine Nausea And Vomiting and Other (See Comments)    Nausea vomiting,  Muscle weakness   Lisinopril Cough    ACEi-cough    Outpatient Encounter Medications as of 03/21/2021  Medication Sig   aspirin EC 81 MG tablet Take 1 tablet (81 mg total) by mouth daily.   Blood Glucose Monitoring Suppl (ONE TOUCH ULTRA 2) w/Device KIT 1 device for checking blood sugar once daily   fluticasone (FLONASE) 50 MCG/ACT nasal spray Place 2 sprays into both nostrils daily.   glucose blood (ONE TOUCH ULTRA TEST) test strip CHECK BLOOD SUGAR ONCE DAILY   Lancets (ONETOUCH ULTRASOFT) lancets Use as instructed   loratadine (CLARITIN) 10 MG tablet Take 1 tablet (10 mg total) by mouth daily.   losartan (COZAAR) 50 MG tablet TAKE 1 TABLET BY MOUTH  DAILY    metFORMIN (GLUCOPHAGE) 500 MG tablet TAKE 1 TABLET BY MOUTH  TWICE DAILY WITH A MEAL   naproxen (NAPROSYN) 500 MG tablet Take 1 tablet (500 mg total) by mouth 2 (two) times daily with a meal.   rosuvastatin (CRESTOR) 10 MG tablet TAKE 1 TABLET BY MOUTH DAILY   traMADol (ULTRAM) 50 MG tablet Take 1 tablet (50 mg total) by mouth every 6 (six) hours as needed.   No facility-administered encounter medications on file as of 03/21/2021.    Patient Active Problem List   Diagnosis Date Noted   Hydronephrosis with obstructing calculus 09/22/2020   Acute lower UTI 09/22/2020   Salivary gland swelling 02/11/2019   Environmental and seasonal allergies 08/12/2018   Hyperlipidemia associated with type 2 diabetes mellitus ([place]) 01/20/2017   Overweight (BMI 25.0-29.9) 07/11/2016   Diabetes mellitus, type 2 ([place]) 07/11/2016   Essential hypertension 07/11/2016   Colon cancer screening 07/11/2016   H/O seasonal allergies 07/08/2016   Osteoarthritis of right knee 07/08/2016   Urinary incontinence 07/08/2016    Conditions to be addressed/monitored:HTN, HLD, and DMII  Care Plan : RNCM: HLD  Updates made by Vanita Ingles, RN since 03/21/2021 12:00 AM  Completed 03/21/2021   Problem: RNCM: [place] or Disease Self-Management (General Plan of Care) Resolved 03/21/2021  Priority: Medium     Long-Range Goal: RNCM: Management of HLD Completed 03/21/2021  Start Date: 06/11/2020  Expected End Date: 09/04/2021  Recent Progress: On track  Priority: Medium  Note:   Current Barriers: Resolving, duplicate goal Poorly controlled hyperlipidemia, complicated by eating  choices at times, elevated hemoglobin A1C of 7.6 on 02-01-2020.  Most recent 08-03-2020, 7.0 % Current antihyperlipidemic regimen: Rosuvastatin 10 mg QD Most recent lipid panel:     Component Value Date/Time   CHOL 123 01/24/2020 0951   CHOL 202 (H) 07/15/2016 1106   TRIG 165 (H) 01/24/2020 0951   HDL 51 01/24/2020 0951   HDL 50  07/15/2016 1106   CHOLHDL 2.4 01/24/2020 0951   LDLCALC 48 01/24/2020 0951   ASCVD risk enhancing conditions: age >15, DM, HTN Unable to independently manage HLD Does not contact provider office for questions/concerns  RN Care Manager Clinical Goal(s):   patient will work with Consulting civil engineer, providers, and care team towards execution of optimized self-health management plan patient will verbalize understanding of plan for effective management of HLD patient will work with [facility] and pcp  to address needs related to HLD  patient will attend all scheduled medical appointments:02-04-2021  Interventions: Collaboration with [place] Ranger Devonne Doughty, DO regarding development and update of comprehensive plan of care as evidenced by provider attestation and co-signature Inter-disciplinary care team collaboration (see longitudinal plan of care) Medication review performed; medication list updated in electronic medical record.  Inter-disciplinary care team collaboration (see longitudinal plan of care) Referred to pharmacy team for assistance with HLD medication management Evaluation of current treatment plan related to HLD and patient's adherence to plan as established by provider. 01-10-2021: The patient is doing well and denies any acute distress. The patient states she is eating well and taking medications as directed. Denies any changes in her HLD health and well being. Will continue to monitor.  Advised patient to call the office for changes in conditions or questions  Provided education to patient re: Heart Healthy/ADA diet, rest, and monitoring for changes in health. 11-08-2020: The patient states she is eating very well and is trying to be mindful of her dietary intake. Denies any issues with her dietary restrictions.  Reviewed scheduled/upcoming provider appointments including: 02-04-2021 at 2 pm Discussed plans with patient for ongoing care management follow up and provided patient with direct  contact information for care management team   Patient Goals/Self-Care Activities: patient will:   - call for medicine refill 2 or 3 days before it runs out - call if I am sick and can't take my medicine - keep a list of all the medicines I take; vitamins and herbals too - learn to read medicine labels - use a pillbox to sort medicine - use an alarm clock or phone to remind me to take my medicine - change to whole grain breads, cereal, pasta - drink 6 to 8 glasses of water each day - eat 5 or 6 small meals each day - fill half the plate with nonstarchy vegetables - limit fast food meals to no more than 1 per week - manage portion size - prepare main meal at home 3 to 5 days each week - read food labels for fat, fiber, carbohydrates and portion size - be open to making changes - I can manage, know and watch for signs of a heart attack - if I have chest pain, call for help - learn about small changes that will make a big difference - learn my personal risk factors  - barriers to meeting goals identified - change-talk evoked - choices provided - collaboration with team encouraged - decision-making supported - health risks reviewed - problem-solving facilitated - questions answered - readiness for change evaluated - reassurance provided - self-reflection promoted -  self-reliance encouraged  Follow Up Plan: Telephone follow up appointment with care management team member scheduled for: 03-21-2021 at 1 pm      Care Plan : RNCM: Hypertension (Adult)  Updates made by Vanita Ingles, RN since 03/21/2021 12:00 AM  Completed 03/21/2021   Problem: RNCM: Hypertension (Hypertension) Resolved 03/21/2021  Priority: Medium     Long-Range Goal: RNCM: Hypertension Monitored Completed 03/21/2021  Start Date: 06/11/2020  Expected End Date: 09/04/2021  Recent Progress: On track  Priority: Medium  Note:   Objective: Resolving, duplicate goal  Last practice recorded BP readings:  BP  Readings from Last 3 Encounters:  11/30/20 (!) 144/83  10/31/20 (!) 152/74  10/04/20 (!) 149/70   Most recent eGFR/CrCl: No results found for: EGFR  No components found for: CRCL Current Barriers:  Knowledge Deficits related to basic understanding of hypertension pathophysiology and self care management Knowledge Deficits related to understanding of medications prescribed for management of hypertension Unable to independently HTN Does not contact provider office for questions/concerns Case Manager Clinical Goal(s):  patient will verbalize understanding of plan for hypertension management patient will attend all scheduled medical appointments: 02-04-2021 at 2 pm patient will demonstrate improved adherence to prescribed treatment plan for hypertension as evidenced by taking all medications as prescribed, monitoring and recording blood pressure as directed, adhering to low sodium/DASH diet patient will demonstrate improved health management independence as evidenced by checking blood pressure as directed and notifying PCP if SBP>160 or DBP > 90, taking all medications as prescribe, and adhering to a low sodium diet as discussed. patient will verbalize basic understanding of hypertension disease process and self health management plan as evidenced by compliance with heart healthy/ADA diet, compliance with medications, and working with the CCM team to manage health and well being Interventions:  Collaboration with [place] Ranger, Devonne Doughty, DO regarding development and update of comprehensive plan of care as evidenced by provider attestation and co-signature Inter-disciplinary care team collaboration (see longitudinal plan of care) Evaluation of current treatment plan related to hypertension self management and patient's adherence to plan as established by provider. 01-10-2021: The patient states her blood pressures are doing well. Was a little  high when she was dealing with kidney stones and UTI's.  States she is glad she is not having any new issues with kidney stones and UTI's at this time. The patient is sleeping well and eating well. Will continue to monitor for new concerns.  Provided education to patient re: stroke prevention, s/s of heart attack and stroke, DASH diet, complications of uncontrolled blood pressure Reviewed medications with patient and discussed importance of compliance. 01-10-2021: The patient is compliant with her medications regimen. Denies any issues with medications.  Discussed plans with patient for ongoing care management follow up and provided patient with direct contact information for care management team Advised patient, providing education and rationale, to monitor blood pressure daily and record, calling PCP for findings outside established parameters.  Reviewed scheduled/upcoming provider appointments including: 02-04-2021 at 2 pm Self-Care Activities: - Self administers medications as prescribed Attends all scheduled provider appointments Calls provider office for new concerns, questions, or BP outside discussed parameters Checks BP and records as discussed Follows a low sodium diet/DASH diet Patient Goals: - check blood pressure 3 times per week - choose a place to take my blood pressure (home, clinic or office, retail store) - write blood pressure results in a log or diary - agree on reward when goals are met - agree to work together to make  changes - ask questions to understand - have a family meeting to talk about healthy habits - learn about high blood pressure  Follow Up Plan: Telephone follow up appointment with care management team member scheduled for: 03-21-2021 at 1 pm    Care Plan : RNCM: Diabetes Type 2 (Adult)  Updates made by Vanita Ingles, RN since 03/21/2021 12:00 AM  Completed 03/21/2021   Problem: RNCM: Glycemic Management (Diabetes, Type 2) Resolved 03/21/2021  Priority: Medium     Long-Range Goal: RNCM: Glycemic Management  Optimized Completed 03/21/2021  Start Date: 06/11/2020  Expected End Date: 09/04/2021  Recent Progress: On track  Priority: Medium  Note:   Objective: resolving, duplicate goal  Lab Results  Component Value Date   HGBA1C 7.0 (A) 08/03/2020    Lab Results  Component Value Date   CREATININE 0.83 01/24/2020   BUN 14 01/24/2020   NA 137 01/24/2020   K 4.3 01/24/2020   CL 103 01/24/2020   CO2 22 01/24/2020     No results found for: EGFR Current Barriers:  Knowledge Deficits related to basic Diabetes pathophysiology and self care/management Knowledge Deficits related to medications used for management of diabetes Does not contact provider office for questions/concerns Case Manager Clinical Goal(s):  patient will demonstrate improved adherence to prescribed treatment plan for diabetes self care/management as evidenced by: daily monitoring and recording of CBG  adherence to ADA/ carb modified diet exercise 4/5 days/week adherence to prescribed medication regimen contacting provider for new or worsened symptoms or questions Interventions:  Collaboration with Olin Hauser, DO regarding development and update of comprehensive plan of care as evidenced by provider attestation and co-signature Inter-disciplinary care team collaboration (see longitudinal plan of care) Provided education to patient about basic DM disease process Reviewed medications with patient and discussed importance of medication adherence. 11-08-2020: The patient is compliant with her medications, denies any changes or needs. Discussed plans with patient for ongoing care management follow up and provided patient with direct contact information for care management team Provided patient with written educational materials related to hypo and hyperglycemia and importance of correct treatment. 01-10-2021: The patient denies any lows at this time.  Reviewed scheduled/upcoming provider appointments including: 02-04-2021 at 2  pm Advised patient, providing education and rationale, to check cbg bid and record, calling pcp for findings outside established parameters.  The patient states her blood sugars have been around "150".  Education on fasting <130 and post prandial <180.  The patient states hers is sometimes greater than 150 but that is usually when she has eat "bad".  The patient states that she is trying to watch her diet better since the holidays are over. 08-23-2020: The patient states she is doing better with managing  her DM. She sometimes eats things that are not the best for her but she has a better hemoglobin A1C at last appointment in April. She is also more active now that the weather has changed. She enjoys the pretty weather and doing her craft shows once a month. The patient states that she is doing well. 11-08-2020: The patient states that her fasting blood sugars are around 140-150, review and education on normal parameters. Discussed A1C results and the patient will have new blood work in October. Reviewed of A1C goal of <7.0 to work toward. 01-10-2021: The patient says her blood sugars are consistently at 130 to 140. Denies any highs or lows. Review of blood sugar parameters. Will continue to monitor.  Review of patient status, including  review of consultants reports, relevant laboratory and other test results, and medications completed. Patient Goals/Self-Care Activities - Self administers oral medications as prescribed Self administers insulin as prescribed Attends all scheduled provider appointments Checks blood sugars as prescribed and utilize hyper and hypoglycemia protocol as needed Adheres to prescribed ADA/carb modified - barriers to adherence to treatment plan identified - blood glucose monitoring encouraged - blood glucose readings reviewed - individualized medical nutrition therapy provided - mutual A1C goal set or reviewed - self-awareness of signs/symptoms of hypo or hyperglycemia encouraged -  use of blood glucose monitoring log promoted Follow Up Plan: Telephone follow up appointment with care management team member scheduled for: 03-21-2021 at 1 pm    Care Plan : RNCM: General Plan of Care (Adult) for Chronic Disease Management and Care Coordination Needs  Updates made by Vanita Ingles, RN since 03/21/2021 12:00 AM     Problem: RNCM: Development of plan of care for Chronic Disease Management (HTN, HLD, DM)   Priority: High     Long-Range Goal: RNCM: Development of plan of care for Chronic Disease Management (HTN, HLD, DM)   Start Date: 03/21/2021  Expected End Date: 03/21/2022  Priority: High  Note:   Current Barriers:  Knowledge Deficits related to plan of care for management of HTN, HLD, and DMII  Chronic Disease Management support and education needs related to HTN, HLD, and DMII  RNCM Clinical Goal(s):  Patient will verbalize basic understanding of HTN, HLD, and DMII disease process and self health management plan as evidenced by verbalizing understanding, asking for clarification when not understanding the plan of care and working with the pcp and CCM team to optimize health and well being take all medications exactly as prescribed and will call provider for medication related questions as evidenced by compliance with medications and calling for refill needs before running out of medications     attend all scheduled medical appointments: 08-05-2021 at 1040 am as evidenced by keeping appointments and calling for schedule change needs         demonstrate improved and ongoing adherence to prescribed treatment plan for HTN, HLD, and DMII as evidenced by keeping appointments, following dietary restrictions, taking medications, and calling the office for changes  demonstrate ongoing self health care management ability for effective management of chronic conditions  as evidenced by  working with the CCM team through collaboration with Consulting civil engineer, provider, and care team.    Interventions: 1:1 collaboration with primary care provider regarding development and update of comprehensive plan of care as evidenced by provider attestation and co-signature Inter-disciplinary care team collaboration (see longitudinal plan of care) Evaluation of current treatment plan related to  self management and patient's adherence to plan as established by provider   Diabetes:  (Status: Goal on Track (progressing): YES.) Long Term Goal   Lab Results  Component Value Date   HGBA1C 6.7 (H) 01/28/2021  Assessed patient's understanding of A1c goal: <7% Provided education to patient about basic DM disease process; Reviewed medications with patient and discussed importance of medication adherence;        Reviewed prescribed diet with patient heart healthy/ADA; Counseled on importance of regular laboratory monitoring as prescribed;        Discussed plans with patient for ongoing care management follow up and provided patient with direct contact information for care management team;      Provided patient with written educational materials related to hypo and hyperglycemia and importance of correct treatment;  Reviewed scheduled/upcoming provider appointments including: 08-05-2021 at 1040 am;         Advised patient, providing education and rationale, to check cbg when you have symptoms of low or high blood sugar, before and after exercise, and as directed   and record        call provider for findings outside established parameters;       Review of patient status, including review of consultants reports, relevant laboratory and other test results, and medications completed;       Screening for signs and symptoms of depression related to chronic disease state;        Assessed social determinant of health barriers;         Hyperlipidemia:  (Status: Goal on Track (progressing): YES.) Long Term Goal  Lab Results  Component Value Date   CHOL 121 01/28/2021   HDL 45 (L) 01/28/2021    LDLCALC 51 01/28/2021   TRIG 171 (H) 01/28/2021   CHOLHDL 2.7 01/28/2021     Medication review performed; medication list updated in electronic medical record.  Provider established cholesterol goals reviewed; Counseled on importance of regular laboratory monitoring as prescribed; Provided HLD educational materials; Reviewed role and benefits of statin for ASCVD risk reduction; Discussed strategies to manage statin-induced myalgias; Reviewed importance of limiting foods high in cholesterol;  Hypertension: (Status: Goal on Track (progressing): YES.) Last practice recorded BP readings:  BP Readings from Last 3 Encounters:  02/04/21 (!) 141/64  11/30/20 (!) 144/83  10/31/20 (!) 152/74  Most recent eGFR/CrCl:  Lab Results  Component Value Date   EGFR 79 01/28/2021    No components found for: CRCL  Evaluation of current treatment plan related to hypertension self management and patient's adherence to plan as established by provider;   Provided education to patient re: stroke prevention, s/s of heart attack and stroke; Reviewed prescribed diet heart healthy/ADA diet  Reviewed medications with patient and discussed importance of compliance;  Discussed plans with patient for ongoing care management follow up and provided patient with direct contact information for care management team; Advised patient, providing education and rationale, to monitor blood pressure daily and record, calling PCP for findings outside established parameters;  Advised patient to discuss blood pressure trends  with provider; Provided education on prescribed diet heart healthy/ADA;  Discussed complications of poorly controlled blood pressure such as heart disease, stroke, circulatory complications, vision complications, kidney impairment, sexual dysfunction;   Patient Goals/Self-Care Activities: Take medications as prescribed   Attend all scheduled provider appointments Call pharmacy for medication refills 3-7  days in advance of running out of medications Attend church or other social activities Perform all self care activities independently  Perform IADL's (shopping, preparing meals, housekeeping, managing finances) independently Call provider office for new concerns or questions  Work with the social worker to address care coordination needs and will continue to work with the clinical team to address health care and disease management related needs call the Suicide and Crisis Lifeline: 988 call the [place] National Suicide Prevention Lifeline: 949-671-8805 or TTY: 410-590-2902 TTY 364-250-3319) to talk to a trained counselor call 1-800-273-TALK (toll free, 24 hour hotline) if experiencing a Mental Health or [place]  schedule appointment with eye doctor check blood sugar at prescribed times: before meals and at bedtime, when you have symptoms of low or high blood sugar, and before and after exercise check feet daily for cuts, sores or redness enter blood sugar readings and medication or insulin into daily log take the  blood sugar log to all doctor visits trim toenails straight across drink 6 to 8 glasses of water each day eat fish at least once per week fill half of plate with vegetables limit fast food meals to no more than 1 per week manage portion size prepare main meal at home 3 to 5 days each week read food labels for fat, fiber, carbohydrates and portion size keep feet up while sitting wash and dry feet carefully every day wear comfortable, cotton socks wear comfortable, well-fitting shoes check blood pressure weekly choose a place to take my blood pressure (home, clinic or office, retail store) write blood pressure results in a log or diary learn about high blood pressure keep a blood pressure log take blood pressure log to all doctor appointments call doctor for signs and symptoms of high blood pressure develop an action plan for high blood pressure keep all  doctor appointments take medications for blood pressure exactly as prescribed report new symptoms to your doctor eat more whole grains, fruits and vegetables, lean meats and healthy fats - call for medicine refill 2 or 3 days before it runs out - take all medications exactly as prescribed - call doctor with any symptoms you believe are related to your medicine - call doctor when you experience any new symptoms - go to all doctor appointments as scheduled - adhere to prescribed diet: heart healthy/ADA diet        Plan:Telephone follow up appointment with care management team member scheduled for:  05-16-2021 at 1 pm  [place], MSN, [facility] Mobile: [phone]

## 2021-04-20 DIAGNOSIS — E1169 Type 2 diabetes mellitus with other specified complication: Secondary | ICD-10-CM | POA: Diagnosis not present

## 2021-04-20 DIAGNOSIS — E785 Hyperlipidemia, unspecified: Secondary | ICD-10-CM | POA: Diagnosis not present

## 2021-04-20 DIAGNOSIS — I10 Essential (primary) hypertension: Secondary | ICD-10-CM

## 2021-05-16 ENCOUNTER — Telehealth: Payer: Medicare Other

## 2021-05-16 ENCOUNTER — Ambulatory Visit (INDEPENDENT_AMBULATORY_CARE_PROVIDER_SITE_OTHER): Payer: Medicare Other

## 2021-05-16 DIAGNOSIS — E1169 Type 2 diabetes mellitus with other specified complication: Secondary | ICD-10-CM

## 2021-05-16 DIAGNOSIS — I10 Essential (primary) hypertension: Secondary | ICD-10-CM

## 2021-05-16 NOTE — Patient Instructions (Signed)
Visit Information  Thank you for taking time to visit with me today. Please don't hesitate to contact me if I can be of assistance to you before our next scheduled telephone appointment.  Following are the goals we discussed today:  RNCM Clinical Goal(s):  Patient will verbalize basic understanding of HTN, HLD, and DMII disease process and self health management plan as evidenced by verbalizing understanding, asking for clarification when not understanding the plan of care and working with the pcp and CCM team to optimize health and well being take all medications exactly as prescribed and will call provider for medication related questions as evidenced by compliance with medications and calling for refill needs before running out of medications     attend all scheduled medical appointments: 08-05-2021 at 1040 am as evidenced by keeping appointments and calling for schedule change needs. 05-16-2021: Reminder today given for upcoming appointment         demonstrate improved and ongoing adherence to prescribed treatment plan for HTN, HLD, and DMII as evidenced by keeping appointments, following dietary restrictions, taking medications, and calling the office for changes  demonstrate ongoing self health care management ability for effective management of chronic conditions  as evidenced by  working with the CCM team through collaboration with Consulting civil engineer, provider, and care team.    Interventions: 1:1 collaboration with primary care provider regarding development and update of comprehensive plan of care as evidenced by provider attestation and co-signature Inter-disciplinary care team collaboration (see longitudinal plan of care) Evaluation of current treatment plan related to  self management and patient's adherence to plan as established by provider     Diabetes:  (Status: Goal on Track (progressing): YES.) Long Term Goal         Lab Results  Component Value Date    HGBA1C 6.7 (H) 01/28/2021   Assessed patient's understanding of A1c goal: <7% Provided education to patient about basic DM disease process; Reviewed medications with patient and discussed importance of medication adherence. 05-16-2021: The patient is adherent to medications        Reviewed prescribed diet with patient heart healthy/ADA. 05-16-2021: The patient is compliant with heart healthy diet, denies any acute changes with dietary habits. Tries to drink a lot of water and stay hydrated Counseled on importance of regular laboratory monitoring as prescribed. 05-16-2021: Has regular lab testing. Praised for good control of A1C;        Discussed plans with patient for ongoing care management follow up and provided patient with direct contact information for care management team;      Provided patient with written educational materials related to hypo and hyperglycemia and importance of correct treatment. 05-16-2021: The patient denies any lows. States her blood sugar is pretty consistent.        Reviewed scheduled/upcoming provider appointments including: 08-05-2021 at 1040 am;         Advised patient, providing education and rationale, to check cbg when you have symptoms of low or high blood sugar, before and after exercise, and as directed   and record. 05-16-2021: The patient checks her blood sugars in the am, fasting. States the range is 120-140 consistently. Review of goals of blood sugars: fasting <130 and post prandial of <180.    call provider for findings outside established parameters;       Review of patient status, including review of consultants reports, relevant laboratory and other test results, and medications completed;       Screening for signs and  symptoms of depression related to chronic disease state;        Assessed social determinant of health barriers;     Eye Exam: 05-16-2021: Last eye exam was July of 2022. The patient has eyes checked yearly. Denies any issues at this time.       Hyperlipidemia:  (Status:  Goal on Track (progressing): YES.) Long Term Goal       Lab Results  Component Value Date    CHOL 121 01/28/2021    HDL 45 (L) 01/28/2021    LDLCALC 51 01/28/2021    TRIG 171 (H) 01/28/2021    CHOLHDL 2.7 01/28/2021      Medication review performed; medication list updated in electronic medical record. 05-16-2021: The patient takes Crestor 10 mg daily. Is compliant with medications Provider established cholesterol goals reviewed; Counseled on importance of regular laboratory monitoring as prescribed. 05-16-2021: Has regular lab work.  Provided HLD educational materials; Reviewed role and benefits of statin for ASCVD risk reduction; Discussed strategies to manage statin-induced myalgias; Reviewed importance of limiting foods high in cholesterol. 05-16-2021:Education and support given of avoiding foods high in cholesterol;   Hypertension: (Status: Goal on Track (progressing): YES.) Last practice recorded BP readings:     BP Readings from Last 3 Encounters:  02/04/21 (!) 141/64  11/30/20 (!) 144/83  10/31/20 (!) 152/74  Most recent eGFR/CrCl:       Lab Results  Component Value Date    EGFR 79 01/28/2021    No components found for: CRCL   Evaluation of current treatment plan related to hypertension self management and patient's adherence to plan as established by provider. 05-16-2021: The patient is having stable blood pressures. Denies any issues with HTN or heart health;   Provided education to patient re: stroke prevention, s/s of heart attack and stroke; Reviewed prescribed diet heart healthy/ADA diet  Reviewed medications with patient and discussed importance of compliance. 05-16-2021: The patient is compliant with medications.   Discussed plans with patient for ongoing care management follow up and provided patient with direct contact information for care management team; Advised patient, providing education and rationale, to monitor blood pressure daily and record, calling PCP for  findings outside established parameters;  Advised patient to discuss blood pressure trends  with provider; Provided education on prescribed diet heart healthy/ADA;  Discussed complications of poorly controlled blood pressure such as heart disease, stroke, circulatory complications, vision complications, kidney impairment, sexual dysfunction;    Patient Goals/Self-Care Activities: Take medications as prescribed   Attend all scheduled provider appointments Call pharmacy for medication refills 3-7 days in advance of running out of medications Attend church or other social activities Perform all self care activities independently  Perform IADL's (shopping, preparing meals, housekeeping, managing finances) independently Call provider office for new concerns or questions  Work with the social worker to address care coordination needs and will continue to work with the clinical team to address health care and disease management related needs call the Suicide and Crisis Lifeline: 988 call the [place] National Suicide Prevention Lifeline: 985-330-2223 or TTY: 6077133045 TTY 985 336 6961) to talk to a trained counselor call 1-800-273-TALK (toll free, 24 hour hotline) if experiencing a Mental Health or [place]  schedule appointment with eye doctor- Has an appointment for eye doctor in July of 2023 check blood sugar at prescribed times: before meals and at bedtime, when you have symptoms of low or high blood sugar, and before and after exercise check feet daily for cuts, sores or redness  enter blood sugar readings and medication or insulin into daily log take the blood sugar log to all doctor visits trim toenails straight across drink 6 to 8 glasses of water each day eat fish at least once per week fill half of plate with vegetables limit fast food meals to no more than 1 per week manage portion size prepare main meal at home 3 to 5 days each week read food labels for fat, fiber,  carbohydrates and portion size keep feet up while sitting wash and dry feet carefully every day wear comfortable, cotton socks wear comfortable, well-fitting shoes check blood pressure weekly choose a place to take my blood pressure (home, clinic or office, retail store) write blood pressure results in a log or diary learn about high blood pressure keep a blood pressure log take blood pressure log to all doctor appointments call doctor for signs and symptoms of high blood pressure develop an action plan for high blood pressure keep all doctor appointments take medications for blood pressure exactly as prescribed report new symptoms to your doctor eat more whole grains, fruits and vegetables, lean meats and healthy fats - call for medicine refill 2 or 3 days before it runs out - take all medications exactly as prescribed - call doctor with any symptoms you believe are related to your medicine - call doctor when you experience any new symptoms - go to all doctor appointments as scheduled - adhere to prescribed diet: heart healthy/ADA diet   Our next appointment is by telephone on 07-18-2021 at 1 pm  Please call the care guide team at [phone] if you need to cancel or reschedule your appointment.   If you are experiencing a Mental Health or [place] or need someone to talk to, please call the Suicide and Crisis Lifeline: 988 call the [place] National Suicide Prevention Lifeline: 574-002-4983 or TTY: 201 431 9356 TTY [phone]) to talk to a trained counselor call 1-800-273-TALK (toll free, 24 hour hotline)   Patient verbalizes understanding of instructions and care plan provided today and agrees to view in [place]. Active MyChart status confirmed with patient.    Noreene Larsson RN, MSN, [place] Mobile: [phone]

## 2021-05-16 NOTE — Chronic Care Management (AMB) (Signed)
Chronic Care Management   CCM RN Visit Note  05/16/2021 Name: Andrea Dodson MRN: [id number] DOB: Dec 27, 1950  Subjective: Andrea Dodson is a 71 y.o. year old female who is a primary care patient of Olin Hauser, DO. The care management team was consulted for assistance with disease management and care coordination needs.    Engaged with patient by telephone for follow up visit in response to provider referral for case management and/or care coordination services.   Consent to Services:  The patient was given information about Chronic Care Management services, agreed to services, and gave verbal consent prior to initiation of services.  Please see initial visit note for detailed documentation.   Patient agreed to services and verbal consent obtained.   Assessment: Review of patient past medical history, allergies, medications, health status, including review of consultants reports, laboratory and other test data, was performed as part of comprehensive evaluation and provision of chronic care management services.   SDOH (Social Determinants of Health) assessments and interventions performed:    CCM Care Plan  Allergies  Allergen Reactions   Codeine Nausea And Vomiting and Other (See Comments)    Nausea vomiting,  Muscle weakness   Lisinopril Cough    ACEi-cough    Outpatient Encounter Medications as of 05/16/2021  Medication Sig   aspirin EC 81 MG tablet Take 1 tablet (81 mg total) by mouth daily.   Blood Glucose Monitoring Suppl (ONE TOUCH ULTRA 2) w/Device KIT 1 device for checking blood sugar once daily   fluticasone (FLONASE) 50 MCG/ACT nasal spray Place 2 sprays into both nostrils daily.   glucose blood (ONE TOUCH ULTRA TEST) test strip CHECK BLOOD SUGAR ONCE DAILY   Lancets (ONETOUCH ULTRASOFT) lancets Use as instructed   loratadine (CLARITIN) 10 MG tablet Take 1 tablet (10 mg total) by mouth daily.   losartan (COZAAR) 50 MG tablet TAKE 1 TABLET BY MOUTH  DAILY    metFORMIN (GLUCOPHAGE) 500 MG tablet TAKE 1 TABLET BY MOUTH  TWICE DAILY WITH A MEAL   naproxen (NAPROSYN) 500 MG tablet Take 1 tablet (500 mg total) by mouth 2 (two) times daily with a meal.   rosuvastatin (CRESTOR) 10 MG tablet TAKE 1 TABLET BY MOUTH DAILY   traMADol (ULTRAM) 50 MG tablet Take 1 tablet (50 mg total) by mouth every 6 (six) hours as needed.   No facility-administered encounter medications on file as of 05/16/2021.    Patient Active Problem List   Diagnosis Date Noted   Hydronephrosis with obstructing calculus 09/22/2020   Acute lower UTI 09/22/2020   Salivary gland swelling 02/11/2019   Environmental and seasonal allergies 08/12/2018   Hyperlipidemia associated with type 2 diabetes mellitus ([place]) 01/20/2017   Overweight (BMI 25.0-29.9) 07/11/2016   Diabetes mellitus, type 2 ([place]) 07/11/2016   Essential hypertension 07/11/2016   Colon cancer screening 07/11/2016   H/O seasonal allergies 07/08/2016   Osteoarthritis of right knee 07/08/2016   Urinary incontinence 07/08/2016    Conditions to be addressed/monitored:HTN, HLD, and DMII  Care Plan : RNCM: General Plan of Care (Adult) for Chronic Disease Management and Care Coordination Needs  Updates made by Vanita Ingles, RN since 05/16/2021 12:00 AM     Problem: RNCM: Development of plan of care for Chronic Disease Management (HTN, HLD, DM)   Priority: High     Long-Range Goal: RNCM: Development of plan of care for Chronic Disease Management (HTN, HLD, DM)   Start Date: 03/21/2021  Expected End Date: 03/21/2022  Priority: High  Note:   Current Barriers:  Knowledge Deficits related to plan of care for management of HTN, HLD, and DMII  Chronic Disease Management support and education needs related to HTN, HLD, and DMII  RNCM Clinical Goal(s):  Patient will verbalize basic understanding of HTN, HLD, and DMII disease process and self health management plan as evidenced by verbalizing understanding, asking for  clarification when not understanding the plan of care and working with the pcp and CCM team to optimize health and well being take all medications exactly as prescribed and will call provider for medication related questions as evidenced by compliance with medications and calling for refill needs before running out of medications     attend all scheduled medical appointments: 08-05-2021 at 1040 am as evidenced by keeping appointments and calling for schedule change needs. 05-16-2021: Reminder today given for upcoming appointment         demonstrate improved and ongoing adherence to prescribed treatment plan for HTN, HLD, and DMII as evidenced by keeping appointments, following dietary restrictions, taking medications, and calling the office for changes  demonstrate ongoing self health care management ability for effective management of chronic conditions  as evidenced by  working with the CCM team through collaboration with Consulting civil engineer, provider, and care team.   Interventions: 1:1 collaboration with primary care provider regarding development and update of comprehensive plan of care as evidenced by provider attestation and co-signature Inter-disciplinary care team collaboration (see longitudinal plan of care) Evaluation of current treatment plan related to  self management and patient's adherence to plan as established by provider   Diabetes:  (Status: Goal on Track (progressing): YES.) Long Term Goal   Lab Results  Component Value Date   HGBA1C 6.7 (H) 01/28/2021  Assessed patient's understanding of A1c goal: <7% Provided education to patient about basic DM disease process; Reviewed medications with patient and discussed importance of medication adherence. 05-16-2021: The patient is adherent to medications        Reviewed prescribed diet with patient heart healthy/ADA. 05-16-2021: The patient is compliant with heart healthy diet, denies any acute changes with dietary habits. Tries to drink a  lot of water and stay hydrated Counseled on importance of regular laboratory monitoring as prescribed. 05-16-2021: Has regular lab testing. Praised for good control of A1C;        Discussed plans with patient for ongoing care management follow up and provided patient with direct contact information for care management team;      Provided patient with written educational materials related to hypo and hyperglycemia and importance of correct treatment. 05-16-2021: The patient denies any lows. States her blood sugar is pretty consistent.        Reviewed scheduled/upcoming provider appointments including: 08-05-2021 at 1040 am;         Advised patient, providing education and rationale, to check cbg when you have symptoms of low or high blood sugar, before and after exercise, and as directed   and record. 05-16-2021: The patient checks her blood sugars in the am, fasting. States the range is 120-140 consistently. Review of goals of blood sugars: fasting <130 and post prandial of <180.    call provider for findings outside established parameters;       Review of patient status, including review of consultants reports, relevant laboratory and other test results, and medications completed;       Screening for signs and symptoms of depression related to chronic disease state;        Assessed  social determinant of health barriers;     Eye Exam: 05-16-2021: Last eye exam was July of 2022. The patient has eyes checked yearly. Denies any issues at this time.      Hyperlipidemia:  (Status: Goal on Track (progressing): YES.) Long Term Goal  Lab Results  Component Value Date   CHOL 121 01/28/2021   HDL 45 (L) 01/28/2021   LDLCALC 51 01/28/2021   TRIG 171 (H) 01/28/2021   CHOLHDL 2.7 01/28/2021     Medication review performed; medication list updated in electronic medical record. 05-16-2021: The patient takes Crestor 10 mg daily. Is compliant with medications Provider established cholesterol goals  reviewed; Counseled on importance of regular laboratory monitoring as prescribed. 05-16-2021: Has regular lab work.  Provided HLD educational materials; Reviewed role and benefits of statin for ASCVD risk reduction; Discussed strategies to manage statin-induced myalgias; Reviewed importance of limiting foods high in cholesterol. 05-16-2021:Education and support given of avoiding foods high in cholesterol;  Hypertension: (Status: Goal on Track (progressing): YES.) Last practice recorded BP readings:  BP Readings from Last 3 Encounters:  02/04/21 (!) 141/64  11/30/20 (!) 144/83  10/31/20 (!) 152/74  Most recent eGFR/CrCl:  Lab Results  Component Value Date   EGFR 79 01/28/2021    No components found for: CRCL  Evaluation of current treatment plan related to hypertension self management and patient's adherence to plan as established by provider. 05-16-2021: The patient is having stable blood pressures. Denies any issues with HTN or heart health;   Provided education to patient re: stroke prevention, s/s of heart attack and stroke; Reviewed prescribed diet heart healthy/ADA diet  Reviewed medications with patient and discussed importance of compliance. 05-16-2021: The patient is compliant with medications.   Discussed plans with patient for ongoing care management follow up and provided patient with direct contact information for care management team; Advised patient, providing education and rationale, to monitor blood pressure daily and record, calling PCP for findings outside established parameters;  Advised patient to discuss blood pressure trends  with provider; Provided education on prescribed diet heart healthy/ADA;  Discussed complications of poorly controlled blood pressure such as heart disease, stroke, circulatory complications, vision complications, kidney impairment, sexual dysfunction;   Patient Goals/Self-Care Activities: Take medications as prescribed   Attend all scheduled  provider appointments Call pharmacy for medication refills 3-7 days in advance of running out of medications Attend church or other social activities Perform all self care activities independently  Perform IADL's (shopping, preparing meals, housekeeping, managing finances) independently Call provider office for new concerns or questions  Work with the social worker to address care coordination needs and will continue to work with the clinical team to address health care and disease management related needs call the Suicide and Crisis Lifeline: 988 call the [place] National Suicide Prevention Lifeline: 616-499-1832 or TTY: 807-348-2825 TTY 352-687-8004) to talk to a trained counselor call 1-800-273-TALK (toll free, 24 hour hotline) if experiencing a Mental Health or [place]  schedule appointment with eye doctor- Has an appointment for eye doctor in July of 2023 check blood sugar at prescribed times: before meals and at bedtime, when you have symptoms of low or high blood sugar, and before and after exercise check feet daily for cuts, sores or redness enter blood sugar readings and medication or insulin into daily log take the blood sugar log to all doctor visits trim toenails straight across drink 6 to 8 glasses of water each day eat fish at least once per week  fill half of plate with vegetables limit fast food meals to no more than 1 per week manage portion size prepare main meal at home 3 to 5 days each week read food labels for fat, fiber, carbohydrates and portion size keep feet up while sitting wash and dry feet carefully every day wear comfortable, cotton socks wear comfortable, well-fitting shoes check blood pressure weekly choose a place to take my blood pressure (home, clinic or office, retail store) write blood pressure results in a log or diary learn about high blood pressure keep a blood pressure log take blood pressure log to all doctor appointments call  doctor for signs and symptoms of high blood pressure develop an action plan for high blood pressure keep all doctor appointments take medications for blood pressure exactly as prescribed report new symptoms to your doctor eat more whole grains, fruits and vegetables, lean meats and healthy fats - call for medicine refill 2 or 3 days before it runs out - take all medications exactly as prescribed - call doctor with any symptoms you believe are related to your medicine - call doctor when you experience any new symptoms - go to all doctor appointments as scheduled - adhere to prescribed diet: heart healthy/ADA diet        Plan:Telephone follow up appointment with care management team member scheduled for:  07-18-2021 at 1 pm  [place], MSN, [place] Mobile: [phone]

## 2021-05-17 ENCOUNTER — Other Ambulatory Visit: Payer: Self-pay | Admitting: Family Medicine

## 2021-05-17 NOTE — Telephone Encounter (Signed)
Requested Prescriptions  Pending Prescriptions Disp Refills   metFORMIN (GLUCOPHAGE) 500 MG tablet [Pharmacy Med Name: metFORMIN HCl 500 MG Oral Tablet] 180 tablet 3    Sig: TAKE 1 TABLET BY MOUTH  TWICE DAILY WITH A MEAL     Endocrinology:  Diabetes - Biguanides Passed - 05/17/2021 10:13 PM      Passed - Cr in normal range and within 360 days    Creat  Date Value Ref Range Status  01/28/2021 0.81 0.50 - 1.05 mg/dL Final         Passed - HBA1C is between 0 and 7.9 and within 180 days    Hemoglobin A1C  Date Value Ref Range Status  05/31/2019 6.7  Final    Comment:    [facility] Visit   Hgb A1c MFr Bld  Date Value Ref Range Status  01/28/2021 6.7 (H) <5.7 % of total Hgb Final    Comment:    For someone without known diabetes, a hemoglobin A1c value of 6.5% or greater indicates that they may have  diabetes and this should be confirmed with a follow-up  test. . For someone with known diabetes, a value <7% indicates  that their diabetes is well controlled and a value  greater than or equal to 7% indicates suboptimal  control. A1c targets should be individualized based on  duration of diabetes, age, comorbid conditions, and  other considerations. . Currently, no consensus exists regarding use of hemoglobin A1c for diagnosis of diabetes for children. .          Passed - eGFR in normal range and within 360 days    GFR, Est African American  Date Value Ref Range Status  01/24/2020 84 > OR = 60 mL/min/1.52m Final   GFR, Est Non African American  Date Value Ref Range Status  01/24/2020 72 > OR = 60 mL/min/1.721mFinal   GFR, Estimated  Date Value Ref Range Status  09/23/2020 >60 >60 mL/min Final    Comment:    (NOTE) Calculated using the CKD-EPI Creatinine Equation (2021)    eGFR  Date Value Ref Range Status  01/28/2021 79 > OR = 60 mL/min/1.7386minal    Comment:    The eGFR is based on the CKD-EPI 2021 equation. To calculate  the new eGFR from a previous  Creatinine or Cystatin C result, go to https://www.kidney.org/professionals/ kdoqi/gfr%5Fcalculator          Passed - Valid encounter within last 6 months    Recent Outpatient Visits          2 months ago Encounter for [employer] wellness exam   [facility]   3 months ago [facility]   7 months ago Left nephrolithiasis   [facility] [place]   9 months ago Type 2 diabetes mellitus with other specified complication, without long-term current use of insulin [facility]   1 year ago Annual physical exam   [facility]      Future Appointments            In 2 months [facility]

## 2021-05-21 DIAGNOSIS — E1169 Type 2 diabetes mellitus with other specified complication: Secondary | ICD-10-CM

## 2021-05-21 DIAGNOSIS — E785 Hyperlipidemia, unspecified: Secondary | ICD-10-CM

## 2021-05-21 DIAGNOSIS — I10 Essential (primary) hypertension: Secondary | ICD-10-CM

## 2021-05-25 ENCOUNTER — Other Ambulatory Visit: Payer: Self-pay | Admitting: Family Medicine

## 2021-05-25 DIAGNOSIS — I10 Essential (primary) hypertension: Secondary | ICD-10-CM

## 2021-05-25 DIAGNOSIS — E119 Type 2 diabetes mellitus without complications: Secondary | ICD-10-CM

## 2021-05-27 NOTE — Telephone Encounter (Signed)
Requested Prescriptions  Pending Prescriptions Disp Refills   losartan (COZAAR) 50 MG tablet [Pharmacy Med Name: Losartan Potassium 50 MG Oral Tablet] 90 tablet 0    Sig: TAKE 1 TABLET BY MOUTH  DAILY     Cardiovascular:  Angiotensin Receptor Blockers Failed - 05/25/2021 11:58 PM      Failed - Last BP in normal range    BP Readings from Last 1 Encounters:  02/04/21 (!) 141/64         Passed - Cr in normal range and within 180 days    Creat  Date Value Ref Range Status  01/28/2021 0.81 0.50 - 1.05 mg/dL Final         Passed - K in normal range and within 180 days    Potassium  Date Value Ref Range Status  01/28/2021 4.6 3.5 - 5.3 mmol/L Final         Passed - Patient is not pregnant      Passed - Valid encounter within last 6 months    Recent Outpatient Visits          3 months ago Encounter for [employer] annual wellness exam   [place], DO   3 months ago Annual physical exam   [facility] Olin Hauser, DO   7 months ago Left nephrolithiasis   [place], DO   9 months ago Type 2 diabetes mellitus with other specified complication, without long-term current use of insulin ([place])   [facility] Olin Hauser, DO   1 year ago Annual physical exam   [place], DO      Future Appointments            In 2 months [place] Ranger, Devonne Doughty, [facility], [facility]

## 2021-06-15 IMAGING — CT CT RENAL STONE PROTOCOL
2 of 4 series · 16 of 46 positions shown, 18 images · non-contrast
Comparison: CT 03/30/2019

CLINICAL DATA: Flank pain.  Kidney stone suspected

EXAM:
CT ABDOMEN AND PELVIS WITHOUT CONTRAST
TECHNIQUE: Multidetector CT imaging of the abdomen and pelvis was performed
following the standard protocol without IV contrast.

[Series 2: stone full standard · axial · 0.72mm/px · z∈[-890,-460]mm · 13 of 94 slices shown, 15 images]
[im 4/94  soft-tissue]
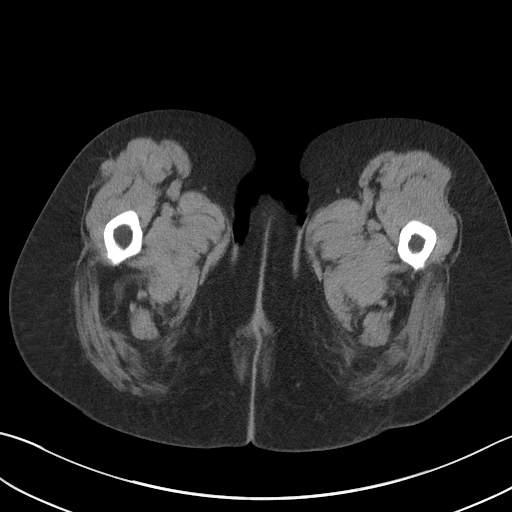
[im 4/94  bone]
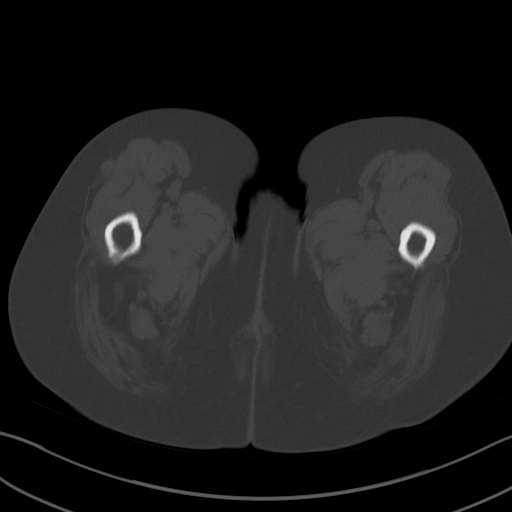
[im 12/94  soft-tissue]
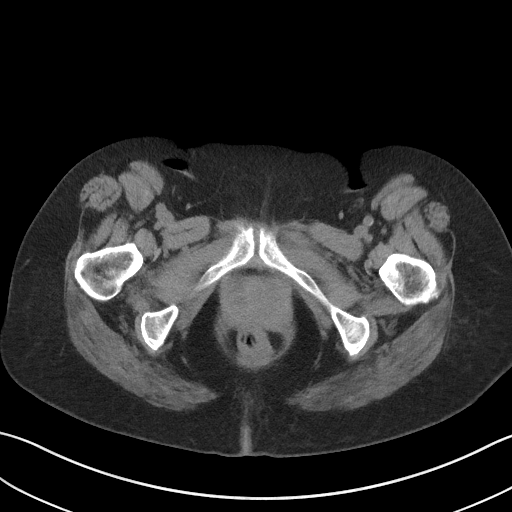
[im 20/94  soft-tissue]
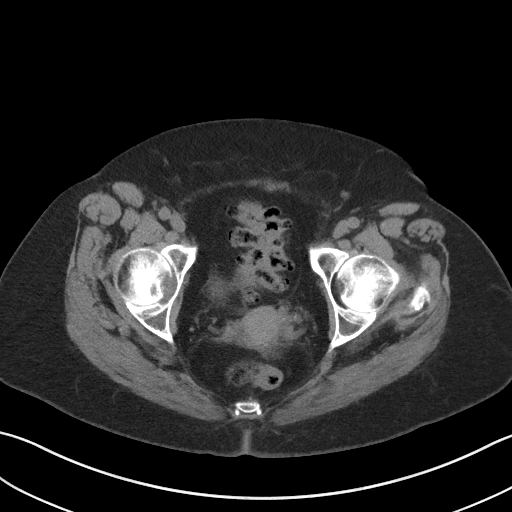
[im 28/94  soft-tissue]
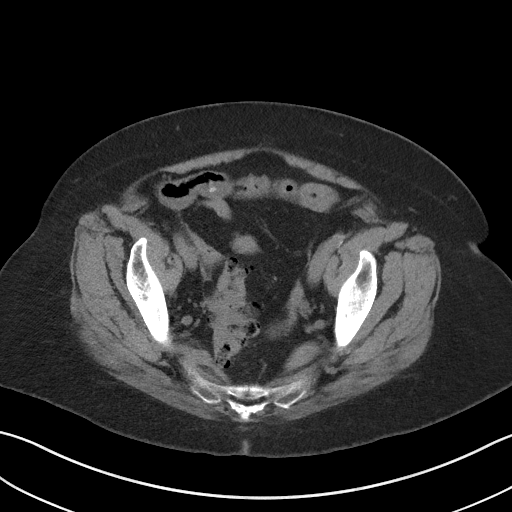
[im 32/94  soft-tissue]
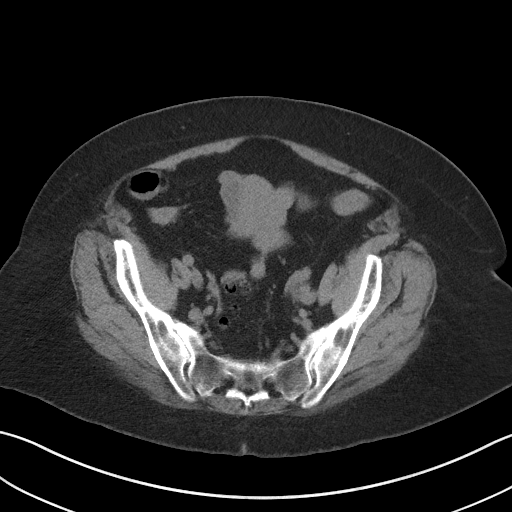
[im 39/94  soft-tissue]
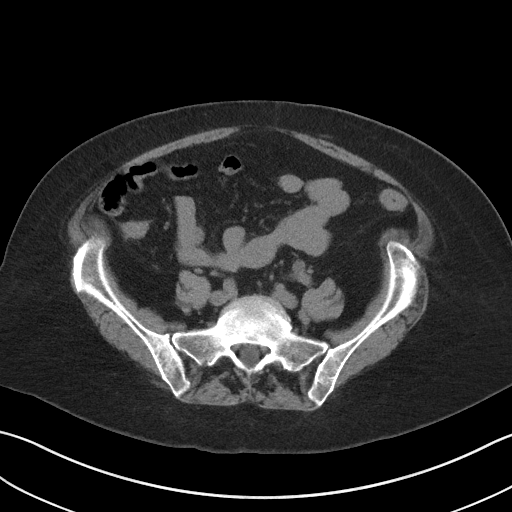
[im 47/94  soft-tissue]
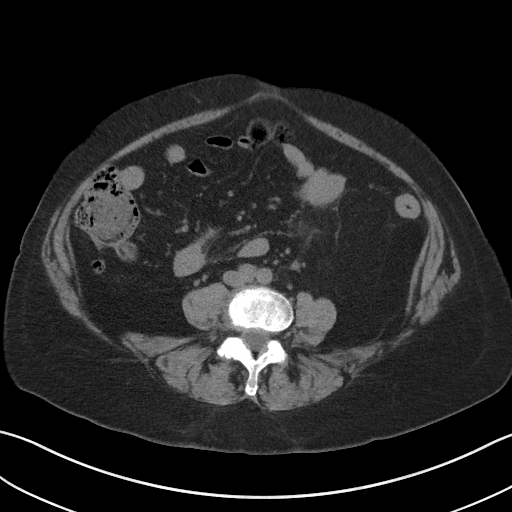
[im 55/94  soft-tissue]
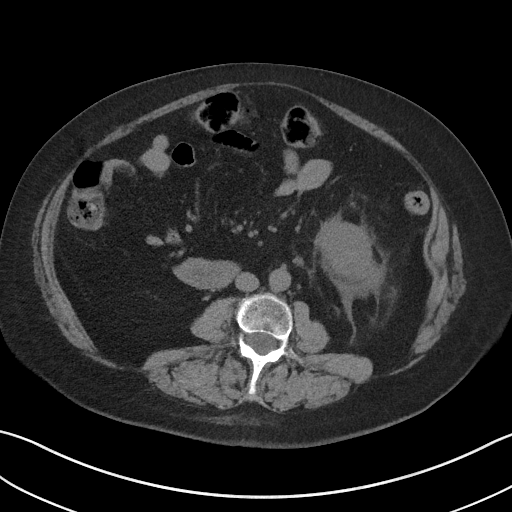
[im 63/94  soft-tissue]
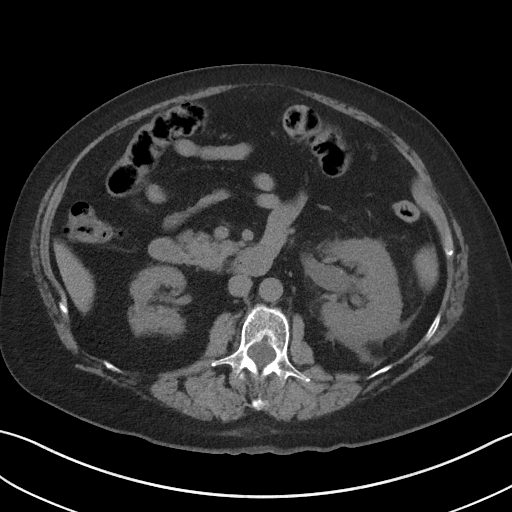
[im 63/94  bone]
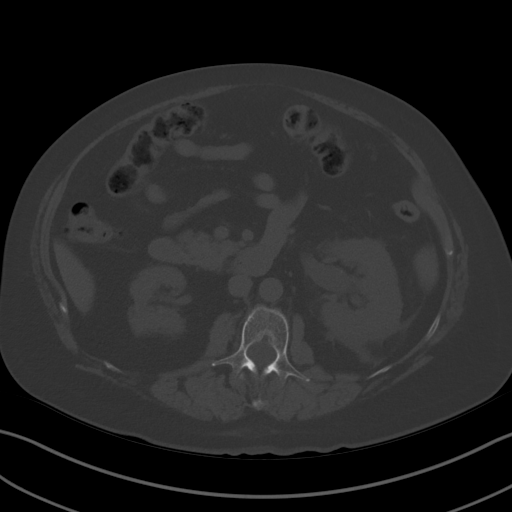
[im 66/94  soft-tissue]
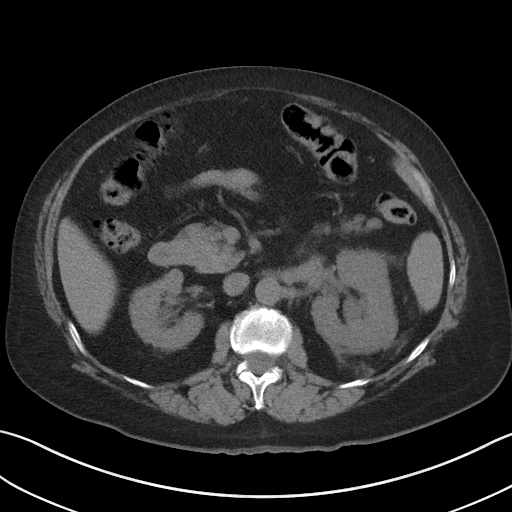
[im 74/94  soft-tissue]
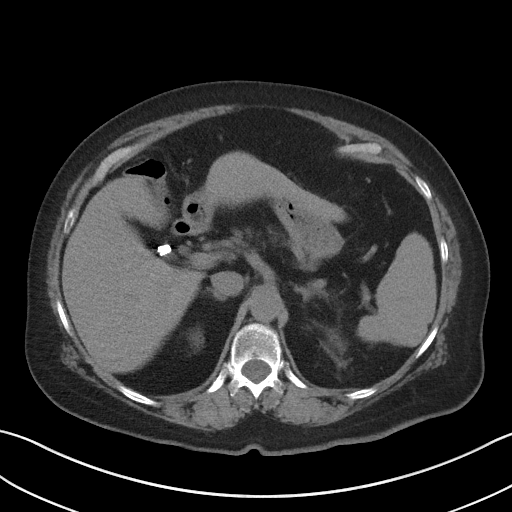
[im 82/94  soft-tissue]
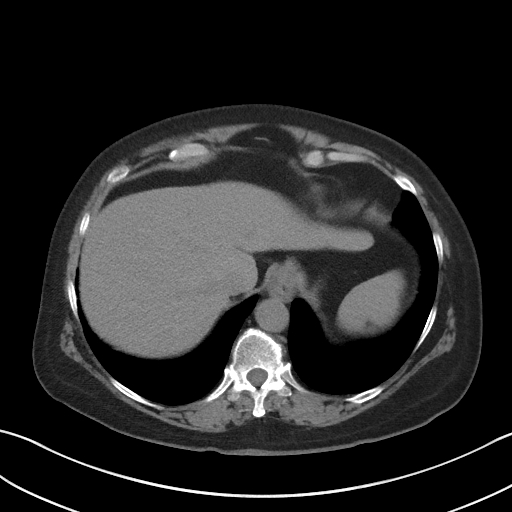
[im 90/94  soft-tissue]
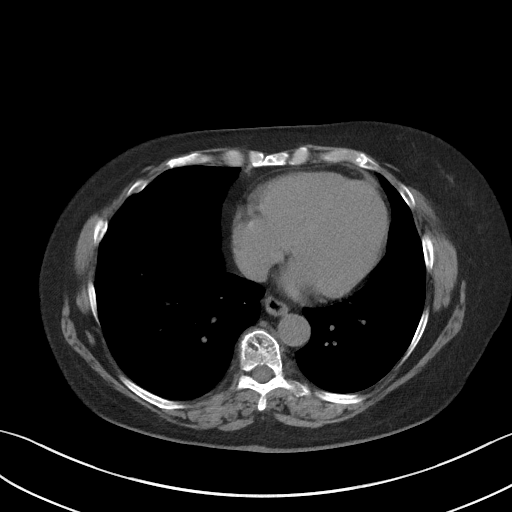

[Series 5: coronal · coronal · 0.80mm/px · 3 of 145 slices shown]
[im 49/145  soft-tissue]
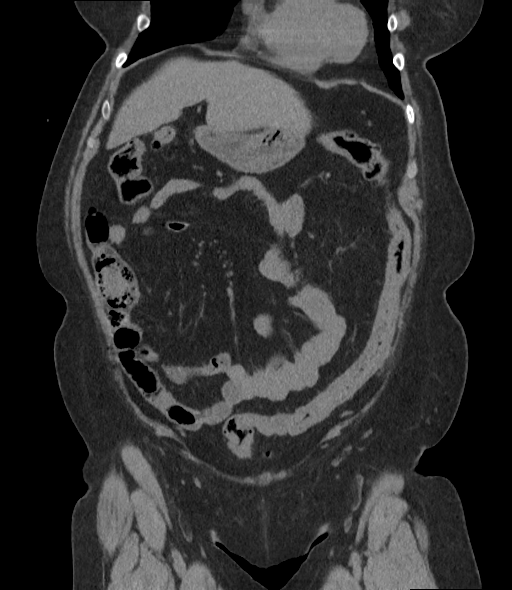
[im 65/145  soft-tissue]
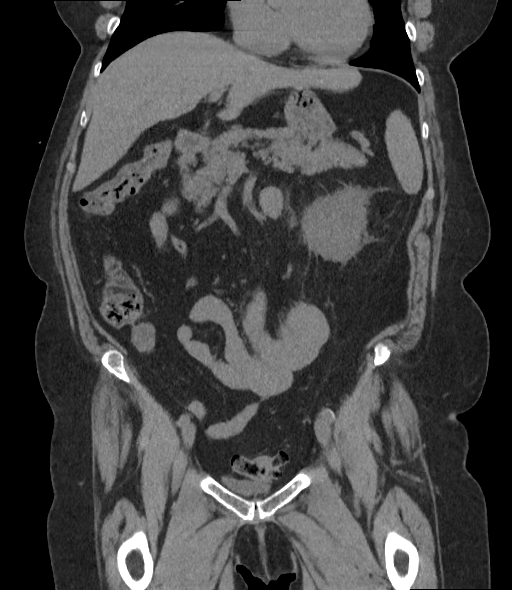
[im 81/145  soft-tissue]
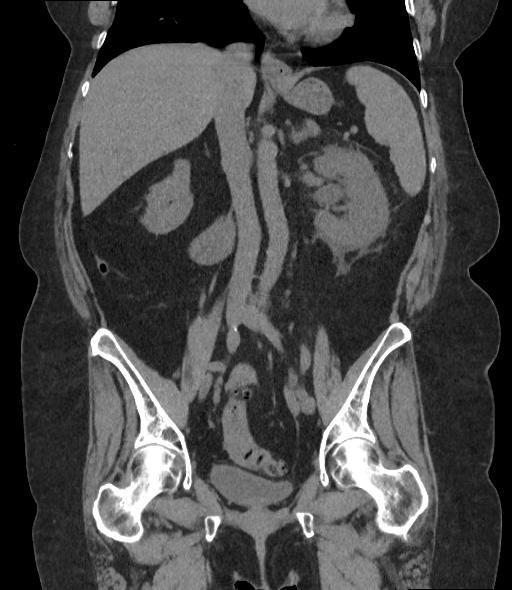

[16 of 46 positions shown; findings below may reference images not displayed]

FINDINGS: Lower chest: Lung bases are clear.

Hepatobiliary: No focal hepatic lesion. Postcholecystectomy. No
biliary dilatation.

Pancreas: No pancreatic inflammation

Spleen: Normal spleen

Adrenals/urinary tract: Adrenal glands are normal.

There is LEFT renal edema and perinephric stranding. Mild
hydronephrosis and hydroureter of the LEFT renal collecting system.
There is an obstructing calculus in the distal LEFT ureter measuring
4 mm on image 76/2. This calculus is approximately 2 cm from the
LEFT vesicoureteral junction.

3 mm nonobstructing calculus in lower pole of the LEFT kidney.

Small nonobstructing 1 mm calculus in the RIGHT kidney. RIGHT renal
cortical scarring. No RIGHT ureterolithiasis. No bladder calculi.

Stomach/Bowel: Stomach, small bowel, appendix, and cecum are normal.
The colon and rectosigmoid colon are normal.

Vascular/Lymphatic: Abdominal aorta is normal caliber. No periportal
or retroperitoneal adenopathy. No pelvic adenopathy.

Reproductive: Uterus and adnexa unremarkable.

Other: No free fluid.

Musculoskeletal: No acute osseous abnormality.
IMPRESSION: 1. Obstructing calculus in the distal LEFT ureter. LEFT
hydronephrosis, renal edema and perinephric stranding.
2. Several additional nonobstructing renal calculi.

## 2021-07-14 ENCOUNTER — Ambulatory Visit
Admission: EM | Admit: 2021-07-14 | Discharge: 2021-07-14 | Disposition: A | Discharge status: Home / Self Care | Payer: Medicare Other | Attending: Emergency Medicine | Admitting: Emergency Medicine

## 2021-07-14 ENCOUNTER — Encounter
Admission: EM | Disposition: A | Discharge status: Home / Self Care | Payer: Self-pay | Source: Home / Self Care | Attending: Emergency Medicine

## 2021-07-14 ENCOUNTER — Emergency Department: Payer: Medicare Other

## 2021-07-14 ENCOUNTER — Emergency Department: Payer: Medicare Other | Admitting: Certified Registered"

## 2021-07-14 ENCOUNTER — Other Ambulatory Visit: Payer: Self-pay

## 2021-07-14 ENCOUNTER — Other Ambulatory Visit: Payer: Self-pay | Admitting: Urology

## 2021-07-14 DIAGNOSIS — Z87440 Personal history of urinary (tract) infections: Secondary | ICD-10-CM | POA: Insufficient documentation

## 2021-07-14 DIAGNOSIS — I10 Essential (primary) hypertension: Secondary | ICD-10-CM | POA: Diagnosis not present

## 2021-07-14 DIAGNOSIS — M419 Scoliosis, unspecified: Secondary | ICD-10-CM | POA: Diagnosis not present

## 2021-07-14 DIAGNOSIS — Z87442 Personal history of urinary calculi: Secondary | ICD-10-CM | POA: Insufficient documentation

## 2021-07-14 DIAGNOSIS — E119 Type 2 diabetes mellitus without complications: Secondary | ICD-10-CM | POA: Insufficient documentation

## 2021-07-14 DIAGNOSIS — N201 Calculus of ureter: Secondary | ICD-10-CM

## 2021-07-14 DIAGNOSIS — K5730 Diverticulosis of large intestine without perforation or abscess without bleeding: Secondary | ICD-10-CM | POA: Diagnosis not present

## 2021-07-14 DIAGNOSIS — I700 Atherosclerosis of aorta: Secondary | ICD-10-CM | POA: Diagnosis not present

## 2021-07-14 DIAGNOSIS — N132 Hydronephrosis with renal and ureteral calculous obstruction: Secondary | ICD-10-CM | POA: Insufficient documentation

## 2021-07-14 DIAGNOSIS — R109 Unspecified abdominal pain: Secondary | ICD-10-CM | POA: Diagnosis not present

## 2021-07-14 DIAGNOSIS — M1990 Unspecified osteoarthritis, unspecified site: Secondary | ICD-10-CM | POA: Diagnosis not present

## 2021-07-14 HISTORY — PX: CYSTOSCOPY W/ RETROGRADES: SHX1426

## 2021-07-14 HISTORY — PX: URETEROSCOPY WITH HOLMIUM LASER LITHOTRIPSY: SHX6645

## 2021-07-14 LAB — CBC WITH DIFFERENTIAL/PLATELET
Abs Immature Granulocytes: 0.06 10*3/uL (ref 0.00–0.07)
Basophils Absolute: 0.1 10*3/uL (ref 0.0–0.1)
Basophils Relative: 0 %
Eosinophils Absolute: 0.1 10*3/uL (ref 0.0–0.5)
Eosinophils Relative: 1 %
HCT: 36.5 % (ref 36.0–46.0)
Hemoglobin: 12.5 g/dL (ref 12.0–15.0)
Immature Granulocytes: 1 %
Lymphocytes Relative: 7 %
Lymphs Abs: 0.9 10*3/uL (ref 0.7–4.0)
MCH: 29.5 pg (ref 26.0–34.0)
MCHC: 34.2 g/dL (ref 30.0–36.0)
MCV: 86.1 fL (ref 80.0–100.0)
Monocytes Absolute: 0.5 10*3/uL (ref 0.1–1.0)
Monocytes Relative: 4 %
Neutro Abs: 11.1 10*3/uL — ABNORMAL HIGH (ref 1.7–7.7)
Neutrophils Relative %: 87 %
Platelets: 274 10*3/uL (ref 150–400)
RBC: 4.24 MIL/uL (ref 3.87–5.11)
RDW: 12.5 % (ref 11.5–15.5)
WBC: 12.6 10*3/uL — ABNORMAL HIGH (ref 4.0–10.5)
nRBC: 0 % (ref 0.0–0.2)

## 2021-07-14 LAB — BASIC METABOLIC PANEL
Anion gap: 10 (ref 5–15)
BUN: 21 mg/dL (ref 8–23)
CO2: 22 mmol/L (ref 22–32)
Calcium: 9.3 mg/dL (ref 8.9–10.3)
Chloride: 100 mmol/L (ref 98–111)
Creatinine, Ser: 0.98 mg/dL (ref 0.44–1.00)
GFR, Estimated: >60 mL/min (ref >60)
Glucose, Bld: 203 mg/dL — ABNORMAL HIGH (ref 70–99)
Potassium: 4.4 mmol/L (ref 3.5–5.1)
Sodium: 132 mmol/L — ABNORMAL LOW (ref 135–145)

## 2021-07-14 LAB — URINALYSIS, ROUTINE W REFLEX MICROSCOPIC
Bilirubin Urine: NEGATIVE
Glucose, UA: 50 mg/dL — AB
Ketones, ur: NEGATIVE mg/dL
Nitrite: NEGATIVE
Protein, ur: NEGATIVE mg/dL
Specific Gravity, Urine: 1.019 (ref 1.005–1.030)
WBC, UA: >50 WBC/hpf — ABNORMAL HIGH (ref 0–5)
pH: 5 (ref 5.0–8.0)

## 2021-07-14 LAB — CBG MONITORING, ED: Glucose-Capillary: 160 mg/dL — ABNORMAL HIGH (ref 70–99)

## 2021-07-14 SURGERY — URETEROSCOPY, WITH LITHOTRIPSY USING HOLMIUM LASER
Anesthesia: General | Site: Ureter | Laterality: Left

## 2021-07-14 MED ORDER — FENTANYL CITRATE (PF) 100 MCG/2ML IJ SOLN
INTRAMUSCULAR | Status: DC | PRN
  Administered 2021-07-14: 100 ug via INTRAVENOUS

## 2021-07-14 MED ORDER — IOHEXOL 180 MG/ML  SOLN
INTRAMUSCULAR | Status: DC | PRN
  Administered 2021-07-14: 20 mL

## 2021-07-14 MED ORDER — DEXAMETHASONE SODIUM PHOSPHATE 10 MG/ML IJ SOLN
INTRAMUSCULAR | Status: DC | PRN
  Administered 2021-07-14: 10 mg via INTRAVENOUS

## 2021-07-14 MED ORDER — LACTATED RINGERS IV SOLN: INTRAVENOUS | Status: DC

## 2021-07-14 MED ORDER — KETOROLAC TROMETHAMINE 30 MG/ML IJ SOLN
INTRAMUSCULAR | Status: DC | PRN
  Administered 2021-07-14: 30 mg via INTRAVENOUS

## 2021-07-14 MED ORDER — SULFAMETHOXAZOLE-TRIMETHOPRIM 800-160 MG PO TABS: 1.0000 | ORAL_TABLET | Freq: Two times a day (BID) | ORAL | 0 refills | Status: DC

## 2021-07-14 MED ORDER — PROPOFOL 10 MG/ML IV BOLUS
INTRAVENOUS | Status: DC | PRN
  Administered 2021-07-14: 70 mg via INTRAVENOUS
  Administered 2021-07-14: 130 mg via INTRAVENOUS

## 2021-07-14 MED ORDER — FENTANYL CITRATE (PF) 100 MCG/2ML IJ SOLN: 25.0000 ug | INTRAMUSCULAR | Status: DC | PRN

## 2021-07-14 MED ORDER — PROPOFOL 10 MG/ML IV BOLUS
INTRAVENOUS | Status: AC
  Filled 2021-07-14: qty 20

## 2021-07-14 MED ORDER — EPHEDRINE 5 MG/ML INJ
INTRAVENOUS | Status: AC
  Filled 2021-07-14: qty 10

## 2021-07-14 MED ORDER — PHENYLEPHRINE 40 MCG/ML (10ML) SYRINGE FOR IV PUSH (FOR BLOOD PRESSURE SUPPORT)
PREFILLED_SYRINGE | INTRAVENOUS | Status: DC | PRN
  Administered 2021-07-14 (×2): 80 ug via INTRAVENOUS

## 2021-07-14 MED ORDER — KETOROLAC TROMETHAMINE 30 MG/ML IJ SOLN
INTRAMUSCULAR | Status: AC
  Filled 2021-07-14: qty 1

## 2021-07-14 MED ORDER — MIDAZOLAM HCL 2 MG/2ML IJ SOLN
INTRAMUSCULAR | Status: AC
  Filled 2021-07-14: qty 2

## 2021-07-14 MED ORDER — EPHEDRINE SULFATE (PRESSORS) 50 MG/ML IJ SOLN
INTRAMUSCULAR | Status: DC | PRN
  Administered 2021-07-14: 10 mg via INTRAVENOUS

## 2021-07-14 MED ORDER — ONDANSETRON HCL 4 MG/2ML IJ SOLN
4.0000 mg | Freq: Once | INTRAMUSCULAR | Status: AC | PRN
  Administered 2021-07-14: 4 mg via INTRAVENOUS

## 2021-07-14 MED ORDER — MIDAZOLAM HCL 2 MG/2ML IJ SOLN
INTRAMUSCULAR | Status: DC | PRN
  Administered 2021-07-14: 2 mg via INTRAVENOUS

## 2021-07-14 MED ORDER — HYDROCODONE-ACETAMINOPHEN 7.5-325 MG PO TABS
1.0000 | ORAL_TABLET | Freq: Once | ORAL | Status: DC | PRN
  Filled 2021-07-14: qty 1

## 2021-07-14 MED ORDER — LACTATED RINGERS IV BOLUS
1000.0000 mL | Freq: Once | INTRAVENOUS | Status: AC
  Administered 2021-07-14: 1000 mL via INTRAVENOUS

## 2021-07-14 MED ORDER — LIDOCAINE HCL URETHRAL/MUCOSAL 2 % EX GEL
CUTANEOUS | Status: AC
  Filled 2021-07-14: qty 10

## 2021-07-14 MED ORDER — LACTATED RINGERS IV SOLN: INTRAVENOUS | Status: DC | PRN

## 2021-07-14 MED ORDER — DEXAMETHASONE SODIUM PHOSPHATE 10 MG/ML IJ SOLN
INTRAMUSCULAR | Status: AC
  Filled 2021-07-14: qty 1

## 2021-07-14 MED ORDER — HYDROCODONE-ACETAMINOPHEN 5-325 MG PO TABS: 1.0000 | ORAL_TABLET | Freq: Four times a day (QID) | ORAL | 0 refills | Status: DC | PRN

## 2021-07-14 MED ORDER — MEPERIDINE HCL 25 MG/ML IJ SOLN: 6.2500 mg | INTRAMUSCULAR | Status: DC | PRN

## 2021-07-14 MED ORDER — SUCCINYLCHOLINE CHLORIDE 200 MG/10ML IV SOSY
PREFILLED_SYRINGE | INTRAVENOUS | Status: DC | PRN
  Administered 2021-07-14: 100 mg via INTRAVENOUS

## 2021-07-14 MED ORDER — ONDANSETRON HCL 4 MG/2ML IJ SOLN
INTRAMUSCULAR | Status: AC
  Filled 2021-07-14: qty 2

## 2021-07-14 MED ORDER — DROPERIDOL 2.5 MG/ML IJ SOLN
0.6250 mg | Freq: Once | INTRAMUSCULAR | Status: DC | PRN
  Filled 2021-07-14: qty 2

## 2021-07-14 MED ORDER — SODIUM CHLORIDE 0.9 % IV SOLN
1.0000 g | Freq: Once | INTRAVENOUS | Status: AC
  Administered 2021-07-14: 1 g via INTRAVENOUS
  Filled 2021-07-14: qty 10

## 2021-07-14 MED ORDER — FENTANYL CITRATE PF 50 MCG/ML IJ SOSY
50.0000 ug | PREFILLED_SYRINGE | Freq: Once | INTRAMUSCULAR | Status: AC
  Administered 2021-07-14: 50 ug via INTRAVENOUS
  Filled 2021-07-14: qty 1

## 2021-07-14 MED ORDER — FENTANYL CITRATE (PF) 100 MCG/2ML IJ SOLN
INTRAMUSCULAR | Status: AC
  Filled 2021-07-14: qty 2

## 2021-07-14 MED ORDER — LIDOCAINE HCL (CARDIAC) PF 100 MG/5ML IV SOSY
PREFILLED_SYRINGE | INTRAVENOUS | Status: DC | PRN
  Administered 2021-07-14: 80 mg via INTRAVENOUS

## 2021-07-14 MED ORDER — LIDOCAINE HCL URETHRAL/MUCOSAL 2 % EX GEL
CUTANEOUS | Status: DC | PRN
  Administered 2021-07-14: 1 via URETHRAL

## 2021-07-14 MED ORDER — LIDOCAINE HCL (PF) 2 % IJ SOLN
INTRAMUSCULAR | Status: AC
  Filled 2021-07-14: qty 5

## 2021-07-14 MED ORDER — ONDANSETRON HCL 4 MG/2ML IJ SOLN
4.0000 mg | Freq: Once | INTRAMUSCULAR | Status: AC
  Administered 2021-07-14: 4 mg via INTRAVENOUS
  Filled 2021-07-14: qty 2

## 2021-07-14 MED ORDER — HYDROMORPHONE HCL 1 MG/ML IJ SOLN
0.5000 mg | Freq: Once | INTRAMUSCULAR | Status: AC
  Administered 2021-07-14: 0.5 mg via INTRAVENOUS
  Filled 2021-07-14: qty 1

## 2021-07-14 MED ORDER — PHENAZOPYRIDINE HCL 200 MG PO TABS: 200.0000 mg | ORAL_TABLET | Freq: Three times a day (TID) | ORAL | 0 refills | Status: DC | PRN

## 2021-07-14 SURGICAL SUPPLY — 24 items
BAG DRAIN CYSTO-URO LG1000N (MISCELLANEOUS) ×2 IMPLANT
BASKET ZERO TIP 1.9FR (BASKET) ×2 IMPLANT
CATH URET FLEX-TIP 2 LUMEN 10F (CATHETERS) ×2 IMPLANT
CATH URETL OPEN 5X70 (CATHETERS) ×2 IMPLANT
CNTNR SPEC 2.5X3XGRAD LEK (MISCELLANEOUS) ×3
CONT SPEC 4OZ STER OR WHT (MISCELLANEOUS) ×1
CONTAINER SPEC 2.5X3XGRAD LEK (MISCELLANEOUS) ×1 IMPLANT
DRAPE UTILITY 15X26 TOWEL STRL (DRAPES) ×2 IMPLANT
GLOVE SURG SYN 8.0 (GLOVE) ×4 IMPLANT
GLOVE SURG SYN 8.0 PF PI (GLOVE) IMPLANT
GLOVE SURG UNDER POLY LF SZ7.5 (GLOVE) ×2 IMPLANT
GOWN STRL REUS W/ TWL XL LVL3 (GOWN DISPOSABLE) ×1 IMPLANT
GOWN STRL REUS W/TWL XL LVL3 (GOWN DISPOSABLE) ×1
GUIDEWIRE GREEN .038 145CM (MISCELLANEOUS) ×2 IMPLANT
GUIDEWIRE STR DUAL SENSOR (WIRE) ×2 IMPLANT
IV NS IRRIG 3000ML ARTHROMATIC (IV SOLUTION) ×2 IMPLANT
KIT TURNOVER CYSTO (KITS) ×2 IMPLANT
MANIFOLD NEPTUNE II (INSTRUMENTS) ×2 IMPLANT
PACK CYSTO AR (MISCELLANEOUS) ×2 IMPLANT
SET CYSTO W/LG BORE CLAMP LF (SET/KITS/TRAYS/PACK) ×2 IMPLANT
SHEATH URETERAL 12FRX35CM (MISCELLANEOUS) ×2 IMPLANT
STENT URET 6FRX24 CONTOUR (STENTS) ×2 IMPLANT
SURGILUBE 2OZ TUBE FLIPTOP (MISCELLANEOUS) ×2 IMPLANT
WATER STERILE IRR 1000ML POUR (IV SOLUTION) ×2 IMPLANT

## 2021-07-14 NOTE — ED Triage Notes (Signed)
Pt in with co acute onset of left flank pain this am, states hx of kidney stones.  ?

## 2021-07-14 NOTE — Anesthesia Preprocedure Evaluation (Signed)
Anesthesia Evaluation  ?Patient identified by MRN, date of birth, ID band ?Patient awake ? ? ? ?Reviewed: ?Allergy & Precautions, NPO status , Patient's Chart, lab work & pertinent test results ? ?History of Anesthesia Complications ?(+) DIFFICULT AIRWAY and history of anesthetic complications (Reported as anterior and large tongue- able to initubate with video laryngoscope) ? ?Airway ?Mallampati: II ? ?TM Distance: >3 FB ?Neck ROM: Full ? ? ? Dental ?no notable dental hx. ? ?  ?Pulmonary ?neg pulmonary ROS,  ?  ?Pulmonary exam normal ?breath sounds clear to auscultation ? ? ? ? ? ? Cardiovascular ?Exercise Tolerance: Good ?METS: 3 - Mets hypertension (Elevated BP), Pt. on medications ?Normal cardiovascular exam ?Rhythm:Regular Rate:Normal ? ? ?  ?Neuro/Psych ?negative neurological ROS ? negative psych ROS  ? GI/Hepatic ?negative GI ROS, Neg liver ROS,   ?Endo/Other  ?negative endocrine ROSdiabetes, Well Controlled, Type 2 ? Renal/GU ?Renal disease (Nephrolithiasis)  ?negative genitourinary ?  ?Musculoskeletal ? ?(+) Arthritis , Osteoarthritis,   ? Abdominal ?  ?Peds ? Hematology ?negative hematology ROS ?(+)   ?Anesthesia Other Findings ?NP since yesterday ? Reproductive/Obstetrics ?negative OB ROS ? ?  ? ? ? ? ? ? ? ? ? ? ? ? ? ?  ?  ? ? ? ? ? ? ? ? ?Anesthesia Physical ?Anesthesia Plan ? ?ASA: 3 and emergent ? ?Anesthesia Plan: General  ? ?Post-op Pain Management:   ? ?Induction: Intravenous ? ?PONV Risk Score and Plan: 3 and Ondansetron and Dexamethasone ? ?Airway Management Planned: LMA ? ?Additional Equipment:  ? ?Intra-op Plan:  ? ?Post-operative Plan: Extubation in OR ? ?Informed Consent: I have reviewed the patients History and Physical, chart, labs and discussed the procedure including the risks, benefits and alternatives for the proposed anesthesia with the patient or authorized representative who has indicated his/her understanding and acceptance.  ? ? ? ?Dental Advisory  Given ? ?Plan Discussed with: Anesthesiologist, CRNA and Surgeon ? ?Anesthesia Plan Comments: (Patient consented for risks of anesthesia including but not limited to:  ?- adverse reactions to medications ?- damage to eyes, teeth, lips or other oral mucosa ?- nerve damage due to positioning  ?- sore throat or hoarseness ?- Damage to heart, brain, nerves, lungs, other parts of body or loss of life ? ?Patient voiced understanding.)  ? ? ? ? ? ? ?Anesthesia Quick Evaluation ? ?

## 2021-07-14 NOTE — ED Provider Notes (Signed)
? ?[facility] ?Provider Note ? ? ? Event Date/Time  ? First MD Initiated Contact with Patient 07/14/21 1102   ?  (approximate) ? ? ?History  ? ?Flank Pain ? ? ?HPI ? ?Andrea Dodson is a 71 y.o. female who presents to the ED for evaluation of Flank Pain ?  ?I review outpatient PCP visit from 10/17.  History of HTN, DM. ?Outpatient urology visit from August.  History of nephrolithiasisS/p ureteroscopy in June 2022.  Recurrent UTIs. ? ?Patient presents to the ED, accompanied by her daughter-in-law, for evaluation of left groin and flank pain.  She reports pain starting this morning in the past 2 or 3 hours, severe with associated nausea and emesis.  Denies stool changes. ? ?Reports yesterday she had some foul-smelling urine and dysuria without flank pain, but reports this cleared up by last night and has not had any of that today. ? ? ?Physical Exam  ? ?Triage Vital Signs: ?ED Triage Vitals  ?Enc Vitals Group  ?   BP 07/14/21 1055 (!) 183/86  ?   Pulse Rate 07/14/21 1055 87  ?   Resp 07/14/21 1055 20  ?   Temp 07/14/21 1055 98.4 ?F (36.9 ?C)  ?   Temp Source 07/14/21 1055 Oral  ?   SpO2 07/14/21 1055 92 %  ?   Weight 07/14/21 1052 154 lb (69.9 kg)  ?   Height 07/14/21 1052 '5\' 2"'$  (1.575 m)  ?   Head Circumference --   ?   Peak Flow --   ?   Pain Score 07/14/21 1052 10  ?   Pain Loc --   ?   Pain Edu? --   ?   Excl. in [place]? --   ? ? ?Most recent vital signs: ?Vitals:  ? 07/14/21 1315 07/14/21 1330  ?BP:  (!) 150/67  ?Pulse: 70 77  ?Resp:  16  ?Temp:    ?SpO2: 91% 98%  ? ? ?General: Awake, no distress.  Obviously uncomfortable, clutching her left flank. ?CV:  Good peripheral perfusion.  ?Resp:  Normal effort.  ?Abd:  No distention.  Left-sided CVA tenderness and left-sided abdominal tenderness is present without peritoneal features. ?MSK:  No deformity noted.  ?Neuro:  No focal deficits appreciated. ?Other:   ? ? ?ED Results / Procedures / Treatments  ? ?Labs ?(all labs ordered are listed, but only  abnormal results are displayed) ?Labs Reviewed  ?BASIC METABOLIC PANEL - Abnormal; Notable for the following components:  ?    Result Value  ? Sodium 132 (*)   ? Glucose, Bld 203 (*)   ? All other components within normal limits  ?URINALYSIS, ROUTINE W REFLEX MICROSCOPIC - Abnormal; Notable for the following components:  ? Color, Urine AMBER (*)   ? APPearance TURBID (*)   ? Glucose, UA 50 (*)   ? Hgb urine dipstick SMALL (*)   ? Leukocytes,Ua LARGE (*)   ? WBC, UA >50 (*)   ? Bacteria, UA RARE (*)   ? All other components within normal limits  ?CBC WITH DIFFERENTIAL/PLATELET - Abnormal; Notable for the following components:  ? WBC 12.6 (*)   ? Neutro Abs 11.1 (*)   ? All other components within normal limits  ?URINE CULTURE  ? ? ?EKG ? ? ?RADIOLOGY ?CT renal study reviewed by me with obstructing left-sided ureterolithiasis as well as left-sided perinephric stranding. ? ?Official radiology report(s): ?CT Renal Stone Study ? ?Result Date: 07/14/2021 ?CLINICAL DATA:  Left flank pain  EXAM: CT ABDOMEN AND PELVIS WITHOUT CONTRAST TECHNIQUE: Multidetector CT imaging of the abdomen and pelvis was performed following the standard protocol without IV contrast. RADIATION DOSE REDUCTION: This exam was performed according to the departmental dose-optimization program which includes automated exposure control, adjustment of the mA and/or kV according to patient size and/or use of iterative reconstruction technique. COMPARISON:  CT abdomen/pelvis 09/22/2020 FINDINGS: Lower chest: A sub 6 mm nodule in the lateral left base is unchanged since 2020, benign. The lung bases are otherwise clear. Imaged heart is unremarkable. Hepatobiliary: The liver is unremarkable. The gallbladder is surgically absent. There is no biliary ductal dilatation. Pancreas: Unremarkable. Spleen: Unremarkable. Adrenals/Urinary Tract: The adrenals are unremarkable. There is moderate left hydroureteronephrosis due to a 4 mm stone in the distal left ureter just  proximal to the UVJ. The left kidney is swollen compared to the right with perinephric stranding. Two punctate additional nonobstructing left lower pole stones are seen. There is a lobular appearance of the right kidney with a probable subcentimeter hemorrhagic/proteinaceous cyst in the upper pole, unchanged compared to the prior study from 2022 (2-24). No other parenchymal lesions are seen in either kidney, within the confines of noncontrast technique. There is a 3 mm nonobstructing right lower pole intrarenal stone. The bladder is decompressed but grossly unremarkable. Stomach/Bowel: The stomach is unremarkable. There is no evidence of bowel obstruction. There is no abnormal bowel wall thickening or inflammatory change. There are sigmoid colonic diverticuli without evidence of acute diverticulitis. The appendix is normal. Vascular/Lymphatic: Is scattered calcified atherosclerotic plaque in the nonaneurysmal abdominal aorta. There is no abdominal or pelvic lymphadenopathy. Reproductive: The uterus and adnexa are unremarkable. Other: There is no ascites or free air. Musculoskeletal: There is no acute osseous abnormality or aggressive osseous lesion. There is mild scoliotic curvature of the imaged spine. IMPRESSION: 1. 4 mm stone in the distal left ureter just proximal to the UVJ with moderate upstream hydroureteronephrosis and perinephric stranding. 2. Additional punctate bilateral nonobstructing intrarenal calculi. 3. Colonic diverticulosis without evidence of acute diverticulitis. 4.  Aortic Atherosclerosis (ICD10-I70.0). Electronically Signed   By: Valetta Mole M.D.   On: 07/14/2021 12:32   ? ?PROCEDURES and INTERVENTIONS: ? ?.1-3 Lead EKG Interpretation ?Performed by: Vladimir Crofts, MD ?Authorized by: Vladimir Crofts, MD  ? ?  Interpretation: normal   ?  ECG rate:  80 ?  ECG rate assessment: normal   ?  Rhythm: sinus rhythm   ?  Ectopy: none   ?  Conduction: normal   ?.Critical Care ?Performed by: Vladimir Crofts,  MD ?Authorized by: Vladimir Crofts, MD  ? ?Critical care provider statement:  ?  Critical care time (minutes):  30 ?  Critical care time was exclusive of:  Separately billable procedures and treating other patients ?  Critical care was time spent personally by me on the following activities:  Development of treatment plan with patient or surrogate, discussions with consultants, evaluation of patient's response to treatment, examination of patient, ordering and review of laboratory studies, ordering and review of radiographic studies, ordering and performing treatments and interventions, pulse oximetry, re-evaluation of patient's condition and review of old charts ? ?Medications  ?fentaNYL (SUBLIMAZE) injection 50 mcg (50 mcg Intravenous Given 07/14/21 1137)  ?ondansetron [facility]) injection 4 mg (4 mg Intravenous Given 07/14/21 1137)  ?lactated ringers bolus 1,000 mL (0 mLs Intravenous Stopped 07/14/21 1246)  ?HYDROmorphone (DILAUDID) injection 0.5 mg (0.5 mg Intravenous Given 07/14/21 1241)  ?cefTRIAXone (ROCEPHIN) 1 g in sodium chloride 0.9 % 100 mL  IVPB (0 g Intravenous Stopped 07/14/21 1317)  ? ? ? ?IMPRESSION / MDM / ASSESSMENT AND PLAN / ED COURSE  ?I reviewed the triage vital signs and the nursing notes. ? ?71 year old female presents to the ED with left-sided flank pain with evidence of obstructing ureterolithiasis with possibly obstructed urine as well, requiring urologic consultation and plan to go to the OR today.  Her vitals are reassuring without fever or tachycardia to suggest sepsis.  Blood work with leukocytosis, but no other SIRS criteria.  Blood work with intact renal function.  Urine with infectious features.  CT obtained with distal ureteral stone that is obstructing with proximal hydronephrosis and associated stranding further concerning for pyelonephritis.  Ultimately concerned for a obstructed and infected stone.  I consult with urology who evaluated the patient and ultimately they decided on going to  the OR for intervention.  Admitted to their service. ? ?Clinical Course as of 07/14/21 1450  ?Sun Jul 14, 2021  ?1314 I called lab for a second time asking about her urinalysis which has been "collected" b

## 2021-07-14 NOTE — Transfer of Care (Signed)
Immediate Anesthesia Transfer of Care Note ? ?Patient: Andrea Dodson ? ?Procedure(s) Performed: URETEROSCOPY WITH LEFT STENT PLACEMENT (Left: Ureter) ?CYSTOSCOPY WITH RETROGRADE PYELOGRAM (Bilateral: Ureter) ? ?Patient Location: PACU ? ?Anesthesia Type:General ? ?Level of Consciousness: awake, alert  and oriented ? ?Airway & Oxygen Therapy: Patient Spontanous Breathing and Patient connected to nasal cannula oxygen ? ?Post-op Assessment: Report given to RN, Post -op Vital signs reviewed and stable and Patient moving all extremities ? ?Post vital signs: Reviewed and stable ? ?Last Vitals:  ?Vitals Value Taken Time  ?BP 129/62 07/14/21 1645  ?Temp 36.8 ?C 07/14/21 1641  ?Pulse 96 07/14/21 1645  ?Resp 19 07/14/21 1645  ?SpO2 99 % 07/14/21 1645  ?Vitals shown include unvalidated device data. ? ?Last Pain:  ?Vitals:  ? 07/14/21 1641  ?TempSrc:   ?PainSc: Asleep  ?   ? ?  ? ?Complications: No notable events documented. ?

## 2021-07-14 NOTE — Op Note (Signed)
OPERATIVE NOTE ? ? ?Patient Name: Andrea Dodson ? ?MRN: [id number] ? ?Date of Procedure: 07/14/21 ? ?Preoperative diagnosis:  ?Left ureteral calculus ?Left flank pain ? ?Postoperative diagnosis:  ?Left ureteral calculus, status post spontaneous passage ?Left flank pain ? ?Procedure:  ?Cystoscopy ?Bilateral retrograde pyelograms ?Left ureteroscopy ?Insertion of left ureteral stent (42F x 24 cm, with tether) ? ?Attending: Primus Bravo, MD ? ?Anesthesia: General ? ?Estimated blood loss: <5 mL ? ?Fluids: Per anesthesia record ? ?Drains: 42F x 24 centimeter left ureteral stent with tether ? ?Specimens: None ? ?Antibiotics: Rocephin 1 g IV ? ?Findings: Normal urethra and bladder; stone seen within the bladder; blood clot noted at left UO; no stone seen within the left ureter; significant mucosal edema of left distal ureter ? ?Indications:  ?71 year old female with a history of nephrolithiasis presented to the emergency room earlier today with acute onset of left-sided flank pain with associated nausea and vomiting.  No fevers or chills.  CT imaging showed a 3 x 4 mm calculus in the left distal ureter with associated obstruction and small bilateral nonobstructing renal calculi.  Treatment options were discussed with the patient.  She elected to proceed with surgical management with cystoscopy, bilateral retrograde pyelograms, left ureteroscopic stone manipulation with possible laser lithotripsy, and insertion of left ureteral stent.  Risk and benefits of the procedure were discussed with the patient in detail.  She understands and wishes to proceed as described. ? ?Description of Procedure:  ?A timeout was performed at the beginning of the procedure.  The patient received IV Rocephin in the emergency department.  After successful induction of a general anesthetic, the patient was placed in the lithotomy position.  The patient's genital area was prepped and draped in sterile fashion.  Under direct visualization, a 21  French rigid scope was passed through the urethra into the bladder.  No urethral abnormalities were appreciated.  Examination of the bladder demonstrated no stones or mucosal abnormalities.  A small blood clot was noted at the left UO. ? ?Bilateral retrograde pyelograms were performed for evaluation of the patient's upper tracts given her history of nephrolithiasis.  Using a 5 [place] open-ended catheter, contrast was injected to the right ureter.  A normal collecting system was appreciated.  No filling defect or obstruction was seen.  In a similar fashion, contrast was injected into the left ureter.  The ureter was dilated above the distal ureter extending to the left renal pelvis. ? ?A sensor guidewire was placed into the left distal ureter and passed to the left renal pelvis under fluoroscopic guidance.  Ureteroscopy was performed alongside the guidewire.  Upon entering the left distal ureter, the patient was noted to have significant mucosal edema consistent with a prior stone position.  No stone was seen however in the distal ureter.  Ureteroscopy was performed up to the renal pelvis and no stone or other ureteral abnormality was seen.  The ureteroscope was removed with reinspection of the ureter.  Due to the significant edema of the distal ureter I elected to place a left ureteral stent.  A 6 French by 24 cm double-J stent was passed over the guidewire and into the left renal pelvis.  Position was confirmed with fluoroscopy.  The tether was left attached and brought through the meatus.  The bladder was again inspected and no stone was identified.  Bladder was drained and the cystoscope was removed.  The patient was given intraurethral lidocaine jelly.  She was then extubated and taken to the  post anesthesia care unit in stable condition. ? ?Complications: None ? ?Condition: Stable, extubated, transferred to PACU ? ?Plan:  ?Discharge home later today ?Continue stent for 3-5 days ?Follow-up in office for stent  removal. ?

## 2021-07-14 NOTE — Anesthesia Postprocedure Evaluation (Signed)
Anesthesia Post Note ? ?Patient: Andrea Dodson ? ?Procedure(s) Performed: URETEROSCOPY WITH LEFT STENT PLACEMENT (Left: Ureter) ?CYSTOSCOPY WITH RETROGRADE PYELOGRAM (Bilateral: Ureter) ? ?Patient location during evaluation: PACU ?Anesthesia Type: General ?Level of consciousness: awake and alert ?Pain management: pain level controlled ?Vital Signs Assessment: post-procedure vital signs reviewed and stable ?Respiratory status: spontaneous breathing, nonlabored ventilation, respiratory function stable and patient connected to nasal cannula oxygen ?Cardiovascular status: blood pressure returned to baseline and stable ?Postop Assessment: no apparent nausea or vomiting ?Anesthetic complications: no ? ? ?No notable events documented. ? ? ?Last Vitals:  ?Vitals:  ? 07/14/21 1700 07/14/21 1715  ?BP: 140/70 (!) 146/66  ?Pulse: 94 85  ?Resp: 16 14  ?Temp: 36.6 ?C (!) 36.3 ?C  ?SpO2: 95% 95%  ?  ?Last Pain:  ?Vitals:  ? 07/14/21 1715  ?TempSrc: Temporal  ?PainSc: 0-No pain  ? ? ?  ?  ?  ?  ?  ?  ? ?Andrea Dodson ? ? ? ? ?

## 2021-07-14 NOTE — Anesthesia Procedure Notes (Signed)
Procedure Name: Intubation ?Date/Time: 07/14/2021 3:55 PM ?Performed by: Esaw Grandchild, CRNA ?Pre-anesthesia Checklist: Patient identified, Emergency Drugs available, Suction available and Patient being monitored ?Patient Re-evaluated:Patient Re-evaluated prior to induction ?Oxygen Delivery Method: Circle system utilized ?Preoxygenation: Pre-oxygenation with 100% oxygen ?Induction Type: IV induction, Rapid sequence and Cricoid Pressure applied ?Ventilation: Mask ventilation without difficulty ?Laryngoscope Size: Sabra Heck and 2 ?Grade View: Grade I ?Tube type: Oral ?Tube size: 7.0 mm ?Number of attempts: 1 ?Airway Equipment and Method: Stylet, Oral airway and Bite block ?Placement Confirmation: ETT inserted through vocal cords under direct vision, positive ETCO2 and breath sounds checked- equal and bilateral ?Secured at: 20 cm ?Tube secured with: Tape ?Dental Injury: Teeth and Oropharynx as per pre-operative assessment  ? ? ? ? ?

## 2021-07-14 NOTE — H&P (Signed)
Urology Consult  ? ?Physician requesting consult: Vladimir Crofts, MD ? ?Reason for consult: Left ureteral calculus, left flank pain ? ?History of Present Illness: Andrea Dodson is a 71 y.o. female with a history of nephrolithiasis who presented to the emergency room earlier today with acute onset of left-sided flank pain.  She had associated nausea and vomiting.  No fevers or chills.  No dysuria or gross hematuria.  CT imaging demonstrated a 3 x 4 mm calculus in the left distal ureter at the UVJ with associated obstruction.  Small nonobstructing renal calculi were seen bilaterally.  WBC 12.6 K.  Creatinine 0.98.  Urinalysis with >50 WBCs, rare bacteria, 6-10 RBCs, nitrite negative.  She has been managed with IV pain medicine in the ER with some improvement in her flank pain.  She continues to have nausea.  She also received IV Rocephin.  Consult Hayden Pedro was requested. ? ? ?Past Medical History:  ?Diagnosis Date  ? Allergy   ? Arthritis   ? right knee  ? Diabetes mellitus, type 2 ([place])   ? Hypertension   ? Urinary incontinence   ? Wears dentures   ? full upper and lower  ? ? ?Past Surgical History:  ?Procedure Laterality Date  ? APPENDECTOMY    ? CATARACT EXTRACTION W/PHACO Left 08/08/2019  ? Procedure: CATARACT EXTRACTION PHACO AND INTRAOCULAR LENS PLACEMENT (IOC) LEFT 4.59  00:32.3;  Surgeon: Eulogio Bear, MD;  Location: [facility];  Service: Ophthalmology;  Laterality: Left;  Diabetic - oral meds  ? CATARACT EXTRACTION W/PHACO Right 09/05/2019  ? Procedure: CATARACT EXTRACTION PHACO AND INTRAOCULAR LENS PLACEMENT (IOC) RIGHT DIABETIC 4.65  00:34.2;  Surgeon: Eulogio Bear, MD;  Location: [facility];  Service: Ophthalmology;  Laterality: Right;  Diabetic - oral meds  ? CHOLECYSTECTOMY  1993  ? CYSTOSCOPY/URETEROSCOPY/HOLMIUM LASER/STENT PLACEMENT Left 09/22/2020  ? Procedure: CYSTOSCOPY/URETEROSCOPY/HOLMIUM LASER/STENT PLACEMENT;  Surgeon: Festus Aloe, MD;  Location: [facility];  Service:  Urology;  Laterality: Left;  ? TONSILLECTOMY    ? ? ?Medications: ? ?Home meds:  ?No current facility-administered medications on file prior to encounter.  ? ?Current Outpatient Medications on File Prior to Encounter  ?Medication Sig Dispense Refill  ? aspirin EC 81 MG tablet Take 1 tablet (81 mg total) by mouth daily.    ? Blood Glucose Monitoring Suppl (ONE TOUCH ULTRA 2) w/Device KIT 1 device for checking blood sugar once daily 1 each 0  ? fluticasone (FLONASE) 50 MCG/ACT nasal spray Place 2 sprays into both nostrils daily. 16 g 6  ? glucose blood (ONE TOUCH ULTRA TEST) test strip CHECK BLOOD SUGAR ONCE DAILY 100 each 3  ? Lancets (ONETOUCH ULTRASOFT) lancets Use as instructed 100 each 12  ? loratadine (CLARITIN) 10 MG tablet Take 1 tablet (10 mg total) by mouth daily. 30 tablet 11  ? losartan (COZAAR) 50 MG tablet TAKE 1 TABLET BY MOUTH  DAILY 90 tablet 0  ? metFORMIN (GLUCOPHAGE) 500 MG tablet TAKE 1 TABLET BY MOUTH  TWICE DAILY WITH A MEAL 180 tablet 0  ? naproxen (NAPROSYN) 500 MG tablet Take 1 tablet (500 mg total) by mouth 2 (two) times daily with a meal. 20 tablet 2  ? rosuvastatin (CRESTOR) 10 MG tablet TAKE 1 TABLET BY MOUTH DAILY 90 tablet 3  ? traMADol (ULTRAM) 50 MG tablet Take 1 tablet (50 mg total) by mouth every 6 (six) hours as needed. 20 tablet 0  ? ? ? ?Scheduled Meds: ?Continuous Infusions: ?PRN Meds:. ? ?Allergies:  ?  Allergies  ?Allergen Reactions  ? Codeine Nausea And Vomiting and Other (See Comments)  ?  Nausea vomiting,  Muscle weakness  ? Lisinopril Cough  ?  ACEi-cough  ? ? ?Family History  ?Problem Relation Age of Onset  ? Heart failure Mother   ? Heart disease Mother   ? Diabetes Mother   ? Heart attack Mother   ? Cancer Father   ?     lung  ? Heart attack Sister   ? Diabetes Sister   ? Breast cancer Half-Sister   ? ? ?Social History:  reports that she has never smoked. She has never used smokeless tobacco. She reports that she does not drink alcohol and does not use drugs. ? ?ROS: ?A  complete review of systems was performed.  All systems are negative except for pertinent findings as noted. ? ?Physical Exam:  ?Vital signs in last 24 hours: ?Temp:  [98.4 ?F (36.9 ?C)] 98.4 ?F (36.9 ?C) (03/26 1055) ?Pulse Rate:  [70-87] 77 (03/26 1330) ?Resp:  [16-20] 16 (03/26 1330) ?BP: (136-183)/(62-86) 150/67 (03/26 1330) ?SpO2:  [89 %-98 %] 98 % (03/26 1330) ?Weight:  [69.9 kg] 69.9 kg (03/26 1052) ?GENERAL APPEARANCE:  Well appearing, well developed, well nourished, NAD ?HEENT: Atraumatic, Normocephalic, oropharynx clear. ?NECK: Supple without lymphadenopathy or thyromegaly. ?LUNGS: Clear to auscultation bilaterally. ?HEART: Regular Rate and Rhythm without murmurs, gallops, or rubs. ?ABDOMEN: Soft, non-tender, No Masses. ?EXTREMITIES: Moves all extremities well.  Without clubbing, cyanosis, or edema. ?NEUROLOGIC:  Alert and oriented x 3, normal gait, CN II-XII grossly intact.  ?MENTAL STATUS:  Appropriate. ?BACK:  Non-tender to palpation.  No CVAT ?SKIN:  Warm, dry and intact.   ? ?Laboratory Data:  ?Recent Labs  ?  07/14/21 ?1110  ?WBC 12.6*  ?HGB 12.5  ?HCT 36.5  ?PLT 274  ? ? ?Recent Labs  ?  07/14/21 ?1110  ?NA 132*  ?K 4.4  ?CL 100  ?GLUCOSE 203*  ?BUN 21  ?CALCIUM 9.3  ?CREATININE 0.98  ? ? ? ?Results for orders placed or performed during the hospital encounter of 07/14/21 (from the past 24 hour(s))  ?Basic metabolic panel     Status: Abnormal  ? Collection Time: 07/14/21 11:10 AM  ?Result Value Ref Range  ? Sodium 132 (L) 135 - 145 mmol/L  ? Potassium 4.4 3.5 - 5.1 mmol/L  ? Chloride 100 98 - 111 mmol/L  ? CO2 22 22 - 32 mmol/L  ? Glucose, Bld 203 (H) 70 - 99 mg/dL  ? BUN 21 8 - 23 mg/dL  ? Creatinine, Ser 0.98 0.44 - 1.00 mg/dL  ? Calcium 9.3 8.9 - 10.3 mg/dL  ? GFR, Estimated >60 >60 mL/min  ? Anion gap 10 5 - 15  ?Urinalysis, Routine w reflex microscopic     Status: Abnormal  ? Collection Time: 07/14/21 11:10 AM  ?Result Value Ref Range  ? Color, Urine AMBER (A) YELLOW  ? APPearance TURBID (A)  CLEAR  ? Specific Gravity, Urine 1.019 1.005 - 1.030  ? pH 5.0 5.0 - 8.0  ? Glucose, UA 50 (A) NEGATIVE mg/dL  ? Hgb urine dipstick SMALL (A) NEGATIVE  ? Bilirubin Urine NEGATIVE NEGATIVE  ? Ketones, ur NEGATIVE NEGATIVE mg/dL  ? Protein, ur NEGATIVE NEGATIVE mg/dL  ? Nitrite NEGATIVE NEGATIVE  ? Leukocytes,Ua LARGE (A) NEGATIVE  ? RBC / HPF 6-10 0 - 5 RBC/hpf  ? WBC, UA >50 (H) 0 - 5 WBC/hpf  ? Bacteria, UA RARE (A) NONE SEEN  ? Squamous  Epithelial / LPF 11-20 0 - 5  ? WBC Clumps PRESENT   ? Mucus PRESENT   ?CBC with Differential/Platelet     Status: Abnormal  ? Collection Time: 07/14/21 11:10 AM  ?Result Value Ref Range  ? WBC 12.6 (H) 4.0 - 10.5 K/uL  ? RBC 4.24 3.87 - 5.11 MIL/uL  ? Hemoglobin 12.5 12.0 - 15.0 g/dL  ? HCT 36.5 36.0 - 46.0 %  ? MCV 86.1 80.0 - 100.0 fL  ? MCH 29.5 26.0 - 34.0 pg  ? MCHC 34.2 30.0 - 36.0 g/dL  ? RDW 12.5 11.5 - 15.5 %  ? Platelets 274 150 - 400 K/uL  ? nRBC 0.0 0.0 - 0.2 %  ? Neutrophils Relative % 87 %  ? Neutro Abs 11.1 (H) 1.7 - 7.7 K/uL  ? Lymphocytes Relative 7 %  ? Lymphs Abs 0.9 0.7 - 4.0 K/uL  ? Monocytes Relative 4 %  ? Monocytes Absolute 0.5 0.1 - 1.0 K/uL  ? Eosinophils Relative 1 %  ? Eosinophils Absolute 0.1 0.0 - 0.5 K/uL  ? Basophils Relative 0 %  ? Basophils Absolute 0.1 0.0 - 0.1 K/uL  ? Immature Granulocytes 1 %  ? Abs Immature Granulocytes 0.06 0.00 - 0.07 K/uL  ? ?No results found for this or any previous visit (from the past 240 hour(s)). ? ?Renal Function: ?Recent Labs  ?  07/14/21 ?1110  ?CREATININE 0.98  ? ?Estimated Creatinine Clearance: 48.9 mL/min (by C-G formula based on SCr of 0.98 mg/dL). ? ?Radiologic Imaging: ?CT Renal Stone Study ? ?Result Date: 07/14/2021 ?CLINICAL DATA:  Left flank pain EXAM: CT ABDOMEN AND PELVIS WITHOUT CONTRAST TECHNIQUE: Multidetector CT imaging of the abdomen and pelvis was performed following the standard protocol without IV contrast. RADIATION DOSE REDUCTION: This exam was performed according to the departmental  dose-optimization program which includes automated exposure control, adjustment of the mA and/or kV according to patient size and/or use of iterative reconstruction technique. COMPARISON:  CT abdomen/pelvis 06/04/202

## 2021-07-14 NOTE — Discharge Instructions (Signed)
AMBULATORY SURGERY  ?DISCHARGE INSTRUCTIONS ? ? ?The drugs that you were given will stay in your system until tomorrow so for the next 24 hours you should not: ? ?Drive an automobile ?Make any legal decisions ?Drink any alcoholic beverage ? ? ?You may resume regular meals tomorrow.  Today it is better to start with liquids and gradually work up to solid foods. ? ?You may eat anything you prefer, but it is better to start with liquids, then soup and crackers, and gradually work up to solid foods. ? ? ?Please notify your doctor immediately if you have any unusual bleeding, trouble breathing, redness and pain at the surgery site, drainage, fever, or pain not relieved by medication. ? ? ? ?Additional Instructions: ? ? ? ?Please contact your physician with any problems or Same Day Surgery at [phone], Monday through Friday 6 am to 4 pm, or [facility] [place] Main number at [phone].  ?

## 2021-07-15 ENCOUNTER — Encounter: Payer: Self-pay | Admitting: Urology

## 2021-07-15 LAB — URINE CULTURE: Culture: NO GROWTH

## 2021-07-15 LAB — CBG MONITORING, ED: Glucose-Capillary: 173 mg/dL — ABNORMAL HIGH (ref 70–99)

## 2021-07-16 ENCOUNTER — Encounter: Payer: Self-pay | Admitting: Urology

## 2021-07-18 ENCOUNTER — Telehealth: Payer: Medicare Other

## 2021-07-19 ENCOUNTER — Encounter: Payer: Self-pay | Admitting: Physician Assistant

## 2021-07-19 ENCOUNTER — Ambulatory Visit
Admission: RE | Admit: 2021-07-19 | Discharge: 2021-07-19 | Disposition: A | Discharge status: Home / Self Care | Payer: Medicare Other | Source: Ambulatory Visit | Attending: Physician Assistant | Admitting: Physician Assistant

## 2021-07-19 ENCOUNTER — Other Ambulatory Visit: Payer: Self-pay | Admitting: Physician Assistant

## 2021-07-19 ENCOUNTER — Ambulatory Visit (INDEPENDENT_AMBULATORY_CARE_PROVIDER_SITE_OTHER): Payer: Medicare Other | Admitting: Urology

## 2021-07-19 VITALS — BP 144/66 | HR 65 | Ht 62.0 in | Wt 154.0 lb

## 2021-07-19 DIAGNOSIS — Z09 Encounter for follow-up examination after completed treatment for conditions other than malignant neoplasm: Secondary | ICD-10-CM

## 2021-07-19 DIAGNOSIS — N201 Calculus of ureter: Secondary | ICD-10-CM

## 2021-07-19 DIAGNOSIS — R109 Unspecified abdominal pain: Secondary | ICD-10-CM | POA: Diagnosis not present

## 2021-07-20 NOTE — Progress Notes (Signed)
Indications: Patient is 71 y.o. female, who is s/p left ureteroscopy 07/14/2021 by Dr. Felipa Eth for a 3-4 mm left distal ureteral calculus with intractable renal colic.  At the time of ureteroscopy the stone was not present and was identified in the bladder.  A short-term indwelling stent was placed due to significant edema of the left distal ureter.  The stent was left attached to a tether.  She came into the office today to have removed however the string was not identified.  KUB was performed which showed an indwelling stent. ? ?Procedure:  Flexible Cystoscopy with stent removal (93112) ? ?Timeout was performed and the correct patient, procedure and participants were identified.   ? ?Description:  The patient was prepped and draped in the usual sterile fashion. Flexible cystosopy was performed.  The stent was visualized, grasped, and removed intact without difficulty. The patient tolerated the procedure well.  A single dose of oral antibiotics was given. ? ?Complications:  None ? ?Plan:  ?Instructed to call for fever/flank pain post stent removal ?She is an established patient of Dr. Diamantina Providence and has an appointment scheduled with him on 10/31/2021 ? ? ?John Giovanni, MD ? ?

## 2021-07-21 ENCOUNTER — Encounter: Payer: Self-pay | Admitting: Urology

## 2021-07-25 ENCOUNTER — Ambulatory Visit (INDEPENDENT_AMBULATORY_CARE_PROVIDER_SITE_OTHER): Payer: Medicare Other

## 2021-07-25 ENCOUNTER — Telehealth: Payer: Medicare Other

## 2021-07-25 DIAGNOSIS — E1169 Type 2 diabetes mellitus with other specified complication: Secondary | ICD-10-CM

## 2021-07-25 DIAGNOSIS — Z9889 Other specified postprocedural states: Secondary | ICD-10-CM

## 2021-07-25 DIAGNOSIS — N3001 Acute cystitis with hematuria: Secondary | ICD-10-CM

## 2021-07-25 DIAGNOSIS — I10 Essential (primary) hypertension: Secondary | ICD-10-CM

## 2021-07-25 DIAGNOSIS — E785 Hyperlipidemia, unspecified: Secondary | ICD-10-CM

## 2021-07-25 NOTE — Chronic Care Management (AMB) (Signed)
?Chronic Care Management  ? ?CCM RN Visit Note ? ?07/25/2021 ?Name: Andrea Dodson MRN: [id number] DOB: June 12, 1950 ? ?Subjective: ?Andrea Dodson is a 71 y.o. year old female who is a primary care patient of Olin Hauser, DO. The care management team was consulted for assistance with disease management and care coordination needs.   ? ?Engaged with patient by telephone for follow up visit in response to provider referral for case management and/or care coordination services.  ? ?Consent to Services:  ?The patient was given information about Chronic Care Management services, agreed to services, and gave verbal consent prior to initiation of services.  Please see initial visit note for detailed documentation.  ? ?Patient agreed to services and verbal consent obtained.  ? ?Assessment: Review of patient past medical history, allergies, medications, health status, including review of consultants reports, laboratory and other test data, was performed as part of comprehensive evaluation and provision of chronic care management services.  ? ?SDOH (Social Determinants of Health) assessments and interventions performed:   ? ?CCM Care Plan ? ?Allergies  ?Allergen Reactions  ? Codeine Nausea And Vomiting and Other (See Comments)  ?  Nausea vomiting,  Muscle weakness  ? Lisinopril Cough  ?  ACEi-cough  ? ? ?Outpatient Encounter Medications as of 07/25/2021  ?Medication Sig  ? aspirin EC 81 MG tablet Take 1 tablet (81 mg total) by mouth daily.  ? Blood Glucose Monitoring Suppl (ONE TOUCH ULTRA 2) w/Device KIT 1 device for checking blood sugar once daily  ? fluticasone (FLONASE) 50 MCG/ACT nasal spray Place 2 sprays into both nostrils daily.  ? glucose blood (ONE TOUCH ULTRA TEST) test strip CHECK BLOOD SUGAR ONCE DAILY  ? Lancets (ONETOUCH ULTRASOFT) lancets Use as instructed  ? loratadine (CLARITIN) 10 MG tablet Take 1 tablet (10 mg total) by mouth daily.  ? losartan (COZAAR) 50 MG tablet TAKE 1 TABLET BY MOUTH  DAILY  ?  metFORMIN (GLUCOPHAGE) 500 MG tablet TAKE 1 TABLET BY MOUTH  TWICE DAILY WITH A MEAL  ? naproxen (NAPROSYN) 500 MG tablet Take 1 tablet (500 mg total) by mouth 2 (two) times daily with a meal.  ? phenazopyridine (PYRIDIUM) 200 MG tablet Take 1 tablet (200 mg total) by mouth 3 (three) times daily as needed for pain.  ? rosuvastatin (CRESTOR) 10 MG tablet TAKE 1 TABLET BY MOUTH DAILY  ? sulfamethoxazole-trimethoprim (BACTRIM DS) 800-160 MG tablet Take 1 tablet by mouth every 12 (twelve) hours.  ? ?No facility-administered encounter medications on file as of 07/25/2021.  ? ? ?Patient Active Problem List  ? Diagnosis Date Noted  ? Hydronephrosis with obstructing calculus 09/22/2020  ? Acute lower UTI 09/22/2020  ? Salivary gland swelling 02/11/2019  ? Environmental and seasonal allergies 08/12/2018  ? Hyperlipidemia associated with type 2 diabetes mellitus ([place]) 01/20/2017  ? Overweight (BMI 25.0-29.9) 07/11/2016  ? Diabetes mellitus, type 2 ([place]) 07/11/2016  ? Essential hypertension 07/11/2016  ? Colon cancer screening 07/11/2016  ? H/O seasonal allergies 07/08/2016  ? Osteoarthritis of right knee 07/08/2016  ? Urinary incontinence 07/08/2016  ? ? ?Conditions to be addressed/monitored:HTN, HLD, DMII, and Urinary health and ureteral calculus ? ?Care Plan : RNCM: General Plan of Care (Adult) for Chronic Disease Management and Care Coordination Needs  ?Updates made by Vanita Ingles, RN since 07/25/2021 12:00 AM  ?  ? ?Problem: RNCM: Development of plan of care for Chronic Disease Management (HTN, HLD, DM)   ?Priority: High  ?  ? ?Long-Range Goal: RNCM: Psychologist, counselling  of plan of care for Chronic Disease Management (HTN, HLD, DM)   ?Start Date: 03/21/2021  ?Expected End Date: 03/21/2022  ?Priority: High  ?Note:   ?Current Barriers:  ?Knowledge Deficits related to plan of care for management of HTN, HLD, and DMII  ?Chronic Disease Management support and education needs related to HTN, HLD, and DMII ? ?RNCM Clinical Goal(s):   ?Patient will verbalize basic understanding of HTN, HLD, and DMII disease process and self health management plan as evidenced by verbalizing understanding, asking for clarification when not understanding the plan of care and working with the pcp and CCM team to optimize health and well being ?take all medications exactly as prescribed and will call provider for medication related questions as evidenced by compliance with medications and calling for refill needs before running out of medications     ?attend all scheduled medical appointments: 08-14-2021 at 0920 am as evidenced by keeping appointments and calling for schedule change needs.  ?demonstrate improved and ongoing adherence to prescribed treatment plan for HTN, HLD, and DMII as evidenced by keeping appointments, following dietary restrictions, taking medications, and calling the office for changes  ?demonstrate ongoing self health care management ability for effective management of chronic conditions  as evidenced by  working with the CCM team through collaboration with Consulting civil engineer, provider, and care team.  ? ?Interventions: ?1:1 collaboration with primary care provider regarding development and update of comprehensive plan of care as evidenced by provider attestation and co-signature ?Inter-disciplinary care team collaboration (see longitudinal plan of care) ?Evaluation of current treatment plan related to  self management and patient's adherence to plan as established by provider ? ? ?Diabetes:  (Status: Goal on Track (progressing): YES.) Long Term Goal  ? ?Lab Results  ?Component Value Date  ? HGBA1C 6.7 (H) 01/28/2021  ?Assessed patient's understanding of A1c goal: <7% ?Provided education to patient about basic DM disease process; ?Reviewed medications with patient and discussed importance of medication adherence. 05-16-2021: The patient is adherent to medications        ?Reviewed prescribed diet with patient heart healthy/ADA. 07-25-2021: The patient  is compliant with heart healthy diet, denies any acute changes with dietary habits. Tries to drink a lot of water and stay hydrated ?Counseled on importance of regular laboratory monitoring as prescribed. 07-25-2021: Has regular lab testing. Praised for good control of A1C;        ?Discussed plans with patient for ongoing care management follow up and provided patient with direct contact information for care management team;      ?Provided patient with written educational materials related to hypo and hyperglycemia and importance of correct treatment. 07-25-2021: The patient denies any lows. States her blood sugar is pretty consistent.        ?Reviewed scheduled/upcoming provider appointments including: 08-14-2021 at 0920 am;         ?Advised patient, providing education and rationale, to check cbg when you have symptoms of low or high blood sugar, before and after exercise, and as directed   and record. 07-25-2021: The patient checks her blood sugars in the am, fasting. States the range is 140 to 150 fasting consistently. Review of goals of blood sugars: fasting <130 and post prandial of <180.    ?call provider for findings outside established parameters;       ?Review of patient status, including review of consultants reports, relevant laboratory and other test results, and medications completed;       ?Screening for signs and symptoms of  depression related to chronic disease state;        ?Assessed social determinant of health barriers;     ?Eye Exam: 05-16-2021: Last eye exam was July of 2022. The patient has eyes checked yearly. Denies any issues at this time.     ? ?Hyperlipidemia:  (Status: Goal on Track (progressing): YES.) Long Term Goal  ?Lab Results  ?Component Value Date  ? CHOL 121 01/28/2021  ? HDL 45 (L) 01/28/2021  ? [place] 51 01/28/2021  ? TRIG 171 (H) 01/28/2021  ? CHOLHDL 2.7 01/28/2021  ?  ? ?Medication review performed; medication list updated in electronic medical record. 07-25-2021: The patient takes  Crestor 10 mg daily. Is compliant with medications ?Provider established cholesterol goals reviewed; ?Counseled on importance of regular laboratory monitoring as prescribed. 07-25-2021: Has regular lab work.

## 2021-07-25 NOTE — Patient Instructions (Signed)
Visit Information ? ?Thank you for taking time to visit with me today. Please don't hesitate to contact me if I can be of assistance to you before our next scheduled telephone appointment. ? ?Following are the goals we discussed today:  ?RNCM Clinical Goal(s):  ?Patient will verbalize basic understanding of HTN, HLD, and DMII disease process and self health management plan as evidenced by verbalizing understanding, asking for clarification when not understanding the plan of care and working with the pcp and CCM team to optimize health and well being ?take all medications exactly as prescribed and will call provider for medication related questions as evidenced by compliance with medications and calling for refill needs before running out of medications     ?attend all scheduled medical appointments: 08-14-2021 at 0920 am as evidenced by keeping appointments and calling for schedule change needs.  ?demonstrate improved and ongoing adherence to prescribed treatment plan for HTN, HLD, and DMII as evidenced by keeping appointments, following dietary restrictions, taking medications, and calling the office for changes  ?demonstrate ongoing self health care management ability for effective management of chronic conditions  as evidenced by  working with the CCM team through collaboration with Consulting civil engineer, provider, and care team.  ?  ?Interventions: ?1:1 collaboration with primary care provider regarding development and update of comprehensive plan of care as evidenced by provider attestation and co-signature ?Inter-disciplinary care team collaboration (see longitudinal plan of care) ?Evaluation of current treatment plan related to  self management and patient's adherence to plan as established by provider ?  ?  ?Diabetes:  (Status: Goal on Track (progressing): YES.) Long Term Goal  ?  ?     ?Lab Results  ?Component Value Date  ?  HGBA1C 6.7 (H) 01/28/2021  ?Assessed patient's understanding of A1c goal: <7% ?Provided  education to patient about basic DM disease process; ?Reviewed medications with patient and discussed importance of medication adherence. 05-16-2021: The patient is adherent to medications        ?Reviewed prescribed diet with patient heart healthy/ADA. 07-25-2021: The patient is compliant with heart healthy diet, denies any acute changes with dietary habits. Tries to drink a lot of water and stay hydrated ?Counseled on importance of regular laboratory monitoring as prescribed. 07-25-2021: Has regular lab testing. Praised for good control of A1C;        ?Discussed plans with patient for ongoing care management follow up and provided patient with direct contact information for care management team;      ?Provided patient with written educational materials related to hypo and hyperglycemia and importance of correct treatment. 07-25-2021: The patient denies any lows. States her blood sugar is pretty consistent.        ?Reviewed scheduled/upcoming provider appointments including: 08-14-2021 at 0920 am;         ?Advised patient, providing education and rationale, to check cbg when you have symptoms of low or high blood sugar, before and after exercise, and as directed   and record. 07-25-2021: The patient checks her blood sugars in the am, fasting. States the range is 140 to 150 fasting consistently. Review of goals of blood sugars: fasting <130 and post prandial of <180.    ?call provider for findings outside established parameters;       ?Review of patient status, including review of consultants reports, relevant laboratory and other test results, and medications completed;       ?Screening for signs and symptoms of depression related to chronic disease state;        ?  Assessed social determinant of health barriers;     ?Eye Exam: 05-16-2021: Last eye exam was July of 2022. The patient has eyes checked yearly. Denies any issues at this time.     ?  ?Hyperlipidemia:  (Status: Goal on Track (progressing): YES.) Long Term Goal  ?      ?Lab Results  ?Component Value Date  ?  CHOL 121 01/28/2021  ?  HDL 45 (L) 01/28/2021  ?  [place] 51 01/28/2021  ?  TRIG 171 (H) 01/28/2021  ?  CHOLHDL 2.7 01/28/2021  ?  ?  ?Medication review performed; medication list updated in electronic medical record. 07-25-2021: The patient takes Crestor 10 mg daily. Is compliant with medications ?Provider established cholesterol goals reviewed; ?Counseled on importance of regular laboratory monitoring as prescribed. 07-25-2021: Has regular lab work.  ?Provided HLD educational materials; ?Reviewed role and benefits of statin for ASCVD risk reduction; ?Discussed strategies to manage statin-induced myalgias; ?Reviewed importance of limiting foods high in cholesterol. 08-14-2021:Education and support given of avoiding foods high in cholesterol; ?  ?Hypertension: (Status: Goal on Track (progressing): YES.) ?Last practice recorded BP readings:  ?   ?BP Readings from Last 3 Encounters:  ?07/19/21 (!) 144/66  ?07/14/21 (!) 146/66  ?02/04/21 (!) 141/64  ?Most recent eGFR/CrCl:  ?     ?Lab Results  ?Component Value Date  ?  EGFR 79 01/28/2021  ?  No components found for: CRCL ?  ?Evaluation of current treatment plan related to hypertension self management and patient's adherence to plan as established by provider. 07-25-2021: The patient is having stable blood pressures. Denies any issues with HTN or heart health;   ?Provided education to patient re: stroke prevention, s/s of heart attack and stroke; ?Reviewed prescribed diet heart healthy/ADA diet  ?Reviewed medications with patient and discussed importance of compliance. 07-25-2021: The patient is compliant with medications.   ?Discussed plans with patient for ongoing care management follow up and provided patient with direct contact information for care management team; ?Advised patient, providing education and rationale, to monitor blood pressure daily and record, calling PCP for findings outside established parameters;  ?Advised patient  to discuss blood pressure trends  with provider; ?Provided education on prescribed diet heart healthy/ADA;  ?Discussed complications of poorly controlled blood pressure such as heart disease, stroke, circulatory complications, vision complications, kidney impairment, sexual dysfunction;  ?  ?  ?Ureteral Calculus  (Status: New goal.) Long Term Goal  ?Evaluation of current treatment plan related to  Ureteral Calculus ,  self-management and patient's adherence to plan as established by provider. ?Discussed plans with patient for ongoing care management follow up and provided patient with direct contact information for care management team ?Advised patient to call the provider for changes in urinary health or complication related kidney stones; ?Provided education to patient re: information about kidney stones, testing to see what the stones are made of and prevention measures; ?Reviewed medications with patient and discussed compliance. The patient is doing well now. No pain or discomfort. Followed up recently with the specialist and will see again in July for further follow up and recommendations. ; ?Reviewed scheduled/upcoming provider appointments including 08-14-2021 at 0920 am with pcp; ?Discussed plans with patient for ongoing care management follow up and provided patient with direct contact information for care management team; ?Advised patient to discuss concerns about urinary health and kidney stones  with provider;  ?  ?Patient Goals/Self-Care Activities: ?Take medications as prescribed   ?Attend all scheduled provider appointments ?Call pharmacy for  medication refills 3-7 days in advance of running out of medications ?Attend church or other social activities ?Perform all self care activities independently  ?Perform IADL's (shopping, preparing meals, housekeeping, managing finances) independently ?Call provider office for new concerns or questions  ?Work with the Education officer, museum to address care coordination  needs and will continue to work with the clinical team to address health care and disease management related needs ?call the Suicide and Crisis Lifeline: 988 ?call the [place] National Suicide Prevention Lifel

## 2021-07-26 ENCOUNTER — Other Ambulatory Visit: Payer: Self-pay | Admitting: Family Medicine

## 2021-07-26 DIAGNOSIS — E1169 Type 2 diabetes mellitus with other specified complication: Secondary | ICD-10-CM

## 2021-07-26 NOTE — Telephone Encounter (Signed)
Requested Prescriptions  ?Pending Prescriptions Disp Refills  ?? rosuvastatin (CRESTOR) 10 MG tablet [Pharmacy Med Name: ROSUVASTATIN '10MG'$  TABLETS] 90 tablet 3  ?  Sig: TAKE 1 TABLET BY MOUTH DAILY  ?  ? Cardiovascular:  Antilipid - Statins 2 Failed - 07/26/2021  3:13 AM  ?  ?  Failed - Lipid Panel in normal range within the last 12 months  ?  Cholesterol, Total  ?Date Value Ref Range Status  ?07/15/2016 202 (H) 100 - 199 mg/dL Final  ? ?Cholesterol  ?Date Value Ref Range Status  ?01/28/2021 121 <200 mg/dL Final  ? ?LDL Cholesterol (Calc)  ?Date Value Ref Range Status  ?01/28/2021 51 mg/dL (calc) Final  ?  Comment:  ?  Reference range: <100 ?Marland Kitchen ?Desirable range <100 mg/dL for primary prevention;   ?<70 mg/dL for patients with CHD or diabetic patients  ?with > or = 2 CHD risk factors. ?. ?LDL-C is now calculated using the Martin-Hopkins  ?calculation, which is a validated novel method providing  ?better accuracy than the Friedewald equation in the  ?estimation of LDL-C.  ?Cresenciano Genre et al. Annamaria Helling. 4431;540(08): 2061-2068  ?(http://education.[facility].com/faq/FAQ164) ?  ? ?HDL  ?Date Value Ref Range Status  ?01/28/2021 45 (L) > OR = 50 mg/dL Final  ?07/15/2016 50 >39 mg/dL Final  ? ?Triglycerides  ?Date Value Ref Range Status  ?01/28/2021 171 (H) <150 mg/dL Final  ? ?  ?  ?  Passed - Cr in normal range and within 360 days  ?  Creat  ?Date Value Ref Range Status  ?01/28/2021 0.81 0.50 - 1.05 mg/dL Final  ? ?Creatinine, Ser  ?Date Value Ref Range Status  ?07/14/2021 0.98 0.44 - 1.00 mg/dL Final  ?   ?  ?  Passed - Patient is not pregnant  ?  ?  Passed - Valid encounter within last 12 months  ?  Recent Outpatient Visits   ?      ? 5 months ago Encounter for [employer] annual wellness exam  ? [place], DO  ? 5 months ago Annual physical exam  ? [place], DO  ? 9 months ago Left nephrolithiasis  ? [place], DO  ? 11 months ago Type 2 diabetes mellitus with other specified complication, without long-term current use of insulin ([place])  ? [place], DO  ? 1 year ago Annual physical exam  ? [place], DO  ?  ?  ?Future Appointments   ?        ? In 2 weeks Parks Ranger Devonne Doughty, DO [facility] [place]  ?  ? ?  ?  ?  ? ?

## 2021-08-05 ENCOUNTER — Other Ambulatory Visit: Payer: Self-pay | Admitting: Family Medicine

## 2021-08-05 ENCOUNTER — Ambulatory Visit: Payer: Medicare Other | Admitting: Family Medicine

## 2021-08-06 LAB — HEMOGLOBIN A1C: Hemoglobin A1C: 6.9

## 2021-08-06 NOTE — Telephone Encounter (Signed)
Requested Prescriptions  ?Pending Prescriptions Disp Refills  ?? metFORMIN (GLUCOPHAGE) 500 MG tablet [Pharmacy Med Name: metFORMIN HCl 500 MG Oral Tablet] 180 tablet 0  ?  Sig: TAKE 1 TABLET BY MOUTH TWICE  DAILY WITH A MEAL  ?  ? Endocrinology:  Diabetes - Biguanides Failed - 08/05/2021 11:01 PM  ?  ?  Failed - HBA1C is between 0 and 7.9 and within 180 days  ?  Hemoglobin A1C  ?Date Value Ref Range Status  ?05/31/2019 6.7  Final  ?  Comment:  ?  [facility] Visit  ? ?Hgb A1c MFr Bld  ?Date Value Ref Range Status  ?01/28/2021 6.7 (H) <5.7 % of total Hgb Final  ?  Comment:  ?  For someone without known diabetes, a hemoglobin A1c ?value of 6.5% or greater indicates that they may have  ?diabetes and this should be confirmed with a follow-up  ?test. ?. ?For someone with known diabetes, a value <7% indicates  ?that their diabetes is well controlled and a value  ?greater than or equal to 7% indicates suboptimal  ?control. A1c targets should be individualized based on  ?duration of diabetes, age, comorbid conditions, and  ?other considerations. ?. ?Currently, no consensus exists regarding use of ?hemoglobin A1c for diagnosis of diabetes for children. ?. ?  ?   ?  ?  Failed - B12 Level in normal range and within 720 days  ?  No results found for: VITAMINB12   ?  ?  Failed - Valid encounter within last 6 months  ?  Recent Outpatient Visits   ?      ? 5 months ago Encounter for [employer] annual wellness exam  ? [place], DO  ? 6 months ago Annual physical exam  ? [place], DO  ? 10 months ago Left nephrolithiasis  ? [place], DO  ? 1 year ago Type 2 diabetes mellitus with other specified complication, without long-term current use of insulin ([place])  ? [place], DO  ? 1 year ago Annual physical exam  ? [place], DO  ?   ?  ?Future Appointments   ?        ? In 1 week [facility]  ?  ? ?  ?  ?  Passed - Cr in normal range and within 360 days  ?  Creat  ?Date Value Ref Range Status  ?01/28/2021 0.81 0.50 - 1.05 mg/dL Final  ? ?Creatinine, Ser  ?Date Value Ref Range Status  ?07/14/2021 0.98 0.44 - 1.00 mg/dL Final  ?   ?  ?  Passed - eGFR in normal range and within 360 days  ?  GFR, Est African American  ?Date Value Ref Range Status  ?01/24/2020 84 > OR = 60 mL/min/1.59m Final  ? ?GFR, Est Non African American  ?Date Value Ref Range Status  ?01/24/2020 72 > OR = 60 mL/min/1.713mFinal  ? ?GFR, Estimated  ?Date Value Ref Range Status  ?07/14/2021 >60 >60 mL/min Final  ?  Comment:  ?  (NOTE) ?Calculated using the CKD-EPI Creatinine Equation (2021) ?  ? ?eGFR  ?Date Value Ref Range Status  ?01/28/2021 79 > OR = 60 mL/min/1.7340minal  ?  Comment:  ?  The eGFR is based on the CKD-EPI 2021 equation. To calculate  ?  the new eGFR from a previous Creatinine or Cystatin C ?result, go to https://www.kidney.org/professionals/ ?kdoqi/gfr%5Fcalculator ?  ?   ?  ?  Passed - CBC within normal limits and completed in the last 12 months  ?  WBC  ?Date Value Ref Range Status  ?07/14/2021 12.6 (H) 4.0 - 10.5 K/uL Final  ? ?RBC  ?Date Value Ref Range Status  ?07/14/2021 4.24 3.87 - 5.11 MIL/uL Final  ? ?Hemoglobin  ?Date Value Ref Range Status  ?07/14/2021 12.5 12.0 - 15.0 g/dL Final  ? ?HCT  ?Date Value Ref Range Status  ?07/14/2021 36.5 36.0 - 46.0 % Final  ? ?MCHC  ?Date Value Ref Range Status  ?07/14/2021 34.2 30.0 - 36.0 g/dL Final  ? ?MCH  ?Date Value Ref Range Status  ?07/14/2021 29.5 26.0 - 34.0 pg Final  ? ?MCV  ?Date Value Ref Range Status  ?07/14/2021 86.1 80.0 - 100.0 fL Final  ? ?No results found for: PLTCOUNTKUC, LABPLAT, [place] ?RDW  ?Date Value Ref Range Status  ?07/14/2021 12.5 11.5 - 15.5 % Final  ? ?  ?  ?  ? ?

## 2021-08-14 ENCOUNTER — Other Ambulatory Visit: Payer: Self-pay | Admitting: Family Medicine

## 2021-08-14 ENCOUNTER — Encounter: Payer: Self-pay | Admitting: Family Medicine

## 2021-08-14 ENCOUNTER — Ambulatory Visit (INDEPENDENT_AMBULATORY_CARE_PROVIDER_SITE_OTHER): Payer: Medicare Other | Admitting: Family Medicine

## 2021-08-14 VITALS — BP 130/82 | HR 68 | Ht 62.0 in | Wt 153.2 lb

## 2021-08-14 DIAGNOSIS — Z0000 Encounter for general adult medical examination without abnormal findings: Secondary | ICD-10-CM

## 2021-08-14 DIAGNOSIS — I10 Essential (primary) hypertension: Secondary | ICD-10-CM | POA: Diagnosis not present

## 2021-08-14 DIAGNOSIS — E785 Hyperlipidemia, unspecified: Secondary | ICD-10-CM

## 2021-08-14 DIAGNOSIS — D170 Benign lipomatous neoplasm of skin and subcutaneous tissue of head, face and neck: Secondary | ICD-10-CM

## 2021-08-14 DIAGNOSIS — Z23 Encounter for immunization: Secondary | ICD-10-CM | POA: Diagnosis not present

## 2021-08-14 DIAGNOSIS — L720 Epidermal cyst: Secondary | ICD-10-CM

## 2021-08-14 DIAGNOSIS — E1169 Type 2 diabetes mellitus with other specified complication: Secondary | ICD-10-CM

## 2021-08-14 DIAGNOSIS — E119 Type 2 diabetes mellitus without complications: Secondary | ICD-10-CM | POA: Diagnosis not present

## 2021-08-14 MED ORDER — METFORMIN HCL 500 MG PO TABS: 500.0000 mg | ORAL_TABLET | Freq: Two times a day (BID) | ORAL | 3 refills | Status: DC

## 2021-08-14 MED ORDER — LOSARTAN POTASSIUM 50 MG PO TABS: 50.0000 mg | ORAL_TABLET | Freq: Every day | ORAL | 3 refills | Status: DC

## 2021-08-14 MED ORDER — ROSUVASTATIN CALCIUM 10 MG PO TABS: 10.0000 mg | ORAL_TABLET | Freq: Every day | ORAL | 3 refills | Status: DC

## 2021-08-14 NOTE — Assessment & Plan Note (Addendum)
Controlled lipids on statin and lifestyle Fam history HyperTG    Plan: 1. Continue Rosuvastatin 10mg nightly 2. Continue ASA 81mg for primary ASCVD risk reduction 3. Encourage improved lifestyle - low carb/cholesterol, reduce portion size, continue improving regular exercise 

## 2021-08-14 NOTE — Assessment & Plan Note (Signed)
Stable A1c 6.9 Home health reading ?No hypoglycemia ?Complications - other including hyperlipidemia, obesity - increases risk of future cardiovascular complications ? ? ?Plan:  ?1. Continue Metformin IR '500mg'$  BID ?2. Encourage improved lifestyle - low carb, low sugar diet, reduce portion size, continue improving regular exercise ?3. Check CBG, bring log to next visit for review ?4. Continue ASA, ARB, Statin ? ?

## 2021-08-14 NOTE — Patient Instructions (Addendum)
Thank you for coming to the office today. ? ?Shingrix vaccine #1 today, repeat in 2-6 months. ? ?Recent Labs  ?  01/28/21 ?0803 08/06/21 ?0000  ?HGBA1C 6.7* 6.9  ? ?Referral sent to General Surgery ? ?[facility] ?[address] ?[place], [place]  [address] ?Hours: 8:30am to 5pm (M-F) ?Ph [phone] ? ?DUE for FASTING BLOOD WORK (no food or drink after midnight before the lab appointment, only water or coffee without cream/sugar on the morning of) ? ?SCHEDULE "Lab Only" visit in the morning at the clinic for lab draw in 6 MONTHS  ? ?- Make sure Lab Only appointment is at about 1 week before your next appointment, so that results will be available ? ?For Lab Results, once available within 2-3 days of blood draw, you can can log in to MyChart online to view your results and a brief explanation. Also, we can discuss results at next follow-up visit. ? ? ?Please schedule a Follow-up Appointment to: Return in about 6 months (around 02/13/2022) for 6 month fasting lab only then 1 week later Annual Physical. ? ?If you have any other questions or concerns, please feel free to call the office or send a message through [place]. You may also schedule an earlier appointment if necessary. ? ?Additionally, you may be receiving a survey about your experience at our office within a few days to 1 week by e-mail or mail. We value your feedback. ? ?[name], DO ?[place] ?

## 2021-08-14 NOTE — Progress Notes (Signed)
? ?Subjective:  ? ? Patient ID: Andrea Dodson, female    DOB: 1950/12/26, 71 y.o.   MRN: [id number] ? ?Andrea Dodson is a 71 y.o. female presenting on 08/14/2021 for Diabetes and Hypertension ? ? ?HPI ? ? ?CHRONIC DM, Type 2 / Overweight BMI >28 ?Hyperlipidemia ?  ?Last A1c improved. ?CBGs: stable readings ?Meds: Metformin 538m BID ?Tolerating well w/o side-effects ?Currently ARB / ASA daily, on Statin - Rosuvastatin 155m?Lifestyle: ?- Weight stable to improved ?- Diet improved DM diet. Largest mea of day is afternoon/lunch, Occasional sweets ?- Exercise (walking now goal to resume exercise) ?- Request copy of Diabetic Eye Exam 10/2020 [facility], she reports no Diabetic Retinopathy. ?Denies hypoglycemia ?  ?CHRONIC HTN: ?Reports no new concerns. Home BP normal ?Current Meds - Losartan 5012maily ?- Prior history w/ ACEi cough on Lisinopril ?Reports good compliance, took meds today. Tolerating well, w/o complaints. ?  ?Low Hemoglobin / Anemia ?Hemoglobin 11.3 similar to prior 11.4, prior range 2-3 years ago 12-13 range ?Asymptomatic, no bleeding or other symptoms ?Taking Aspirin 81 ?   ?PMH - Seasonal allergies - taking Loratadine 78m55mily, controls her allergy symptoms ?  ?Lipoma vs Cyst on Scalp ?Chronic problem, raised with tender to touch. ?  ?Health Maintenance: ?  ?   ?Last cologuard 02/15/20 negative. ?   ?Shingrix vaccine dose #1 due ? ? ?  08/14/2021  ? 10:36 AM 02/26/2021  ?  9:30 AM 01/10/2021  ?  1:24 PM  ?Depression screen PHQ 2/9  ?Decreased Interest 0 0 0  ?Down, Depressed, Hopeless 0 0 0  ?PHQ - 2 Score 0 0 0  ?Altered sleeping 0    ?Tired, decreased energy 0    ?Change in appetite 0    ?Feeling bad or failure about yourself  0    ?Trouble concentrating 0    ?Moving slowly or fidgety/restless 0    ?Suicidal thoughts 0    ?PHQ-9 Score 0    ?Difficult doing work/chores Not difficult at all    ? ? ?Past Medical History:  ?Diagnosis Date  ? Allergy   ? Arthritis   ? right knee  ? Diabetes mellitus,  type 2 (HCC)[place]? Hypertension   ? Urinary incontinence   ? Wears dentures   ? full upper and lower  ? ?Past Surgical History:  ?Procedure Laterality Date  ? APPENDECTOMY    ? CATARACT EXTRACTION W/PHACO Left 08/08/2019  ? Procedure: CATARACT EXTRACTION PHACO AND INTRAOCULAR LENS PLACEMENT (IOC) LEFT 4.59  00:32.3;  Surgeon: KingEulogio Bear;  [facility];  Laterality: Left;  Diabetic - oral meds  ? CATARACT EXTRACTION W/PHACO Right 09/05/2019  ? Procedure: CATARACT EXTRACTION PHACO AND INTRAOCULAR LENS PLACEMENT (IOC) RIGHT DIABETIC 4.65  00:34.2;  Surgeon: KingEulogio Bear;  [facility] [facility];  Laterality: Right;  Diabetic - oral meds  ? CHOLECYSTECTOMY  1993  ? CYSTOSCOPY W/ RETROGRADES Bilateral 07/14/2021  ? Procedure: CYSTOSCOPY WITH RETROGRADE PYELOGRAM;  Surgeon: StonPrimus BravoD;  Location: [facility];  Service: Urology;  Laterality: Bilateral;  ? CYSTOSCOPY/URETEROSCOPY/HOLMIUM LASER/STENT PLACEMENT Left 09/22/2020  ? Procedure: CYSTOSCOPY/URETEROSCOPY/HOLMIUM LASER/STENT PLACEMENT;  Surgeon: EskrFestus Aloe;  Location: [facility];  Service: Urology;  Laterality: Left;  ? TONSILLECTOMY    ? URETEROSCOPY WITH HOLMIUM LASER LITHOTRIPSY Left 07/14/2021  ? Procedure: URETEROSCOPY WITH LEFT STENT PLACEMENT;  Surgeon: StonPrimus BravoD;  Location: [facility];  Service: Urology;  Laterality: Left;  ? ?Social History  ? ?Socioeconomic History  ? Marital status: Widowed  ?  Spouse name: Not on file  ? Number of children: Not on file  ? Years of education: Not on file  ? Highest education level: Some college, no degree  ?Occupational History  ? Occupation: retired  ?Tobacco Use  ? Smoking status: Never  ?  Passive exposure: Never  ? Smokeless tobacco: Never  ?Vaping Use  ? Vaping Use: Never used  ?Substance and Sexual Activity  ? Alcohol use: No  ? Drug use: No  ? Sexual activity: Not Currently  ?Other Topics  Concern  ? Not on file  ?Social History Narrative  ? Not on file  ? ?Social Determinants of Health  ? ?Financial Resource Strain: Low Risk   ? Difficulty of Paying Living Expenses: Not hard at all  ?Food Insecurity: No Food Insecurity  ? Worried About Charity fundraiser in the Last Year: Never true  ? Ran Out of Food in the Last Year: Never true  ?Transportation Needs: No Transportation Needs  ? Lack of Transportation (Medical): No  ? Lack of Transportation (Non-Medical): No  ?Physical Activity: Inactive  ? Days of Exercise per Week: 0 days  ? Minutes of Exercise per Session: 0 min  ?Stress: No Stress Concern Present  ? Feeling of Stress : Not at all  ?Social Connections: Moderately Isolated  ? Frequency of Communication with Friends and Family: More than three times a week  ? Frequency of Social Gatherings with Friends and Family: More than three times a week  ? Attends Religious Services: Never  ? Active Member of Clubs or Organizations: Yes  ? Attends Archivist Meetings: 1 to 4 times per year  ? Marital Status: Widowed  ?Intimate Partner Violence: Not At Risk  ? Fear of Current or Ex-Partner: No  ? Emotionally Abused: No  ? Physically Abused: No  ? Sexually Abused: No  ? ?Family History  ?Problem Relation Age of Onset  ? Heart failure Mother   ? Heart disease Mother   ? Diabetes Mother   ? Heart attack Mother   ? Cancer Father   ?     lung  ? Heart attack Sister   ? Diabetes Sister   ? Breast cancer Half-Sister   ? ?Current Outpatient Medications on File Prior to Visit  ?Medication Sig  ? aspirin EC 81 MG tablet Take 1 tablet (81 mg total) by mouth daily.  ? Blood Glucose Monitoring Suppl (ONE TOUCH ULTRA 2) w/Device KIT 1 device for checking blood sugar once daily  ? fluticasone (FLONASE) 50 MCG/ACT nasal spray Place 2 sprays into both nostrils daily.  ? glucose blood (ONE TOUCH ULTRA TEST) test strip CHECK BLOOD SUGAR ONCE DAILY  ? Lancets (ONETOUCH ULTRASOFT) lancets Use as instructed  ? loratadine  (CLARITIN) 10 MG tablet Take 1 tablet (10 mg total) by mouth daily.  ? naproxen (NAPROSYN) 500 MG tablet Take 1 tablet (500 mg total) by mouth 2 (two) times daily with a meal.  ? phenazopyridine (PYRIDIUM) 200 MG tablet Take 1 tablet (200 mg total) by mouth 3 (three) times daily as needed for pain.  ? ?No current facility-administered medications on file prior to visit.  ? ? ?Review of Systems ?Per HPI unless specifically indicated above ? ? ?   ?Objective:  ?  ?BP 130/82   Pulse 68   Ht 5' 2" (1.575 m)   Wt 153 lb 3.2 oz (  69.5 kg)   SpO2 99%   BMI 28.02 kg/m?   ?Wt Readings from Last 3 Encounters:  ?08/14/21 153 lb 3.2 oz (69.5 kg)  ?07/19/21 154 lb (69.9 kg)  ?07/14/21 154 lb (69.9 kg)  ?  ?Physical Exam ?Vitals and nursing note reviewed.  ?Constitutional:   ?   General: She is not in acute distress. ?   Appearance: She is well-developed. She is not diaphoretic.  ?   Comments: Well-appearing, comfortable, cooperative  ?HENT:  ?   Head: Normocephalic and atraumatic.  ?Eyes:  ?   General:     ?   Right eye: No discharge.     ?   Left eye: No discharge.  ?   Conjunctiva/sclera: Conjunctivae normal.  ?Neck:  ?   Thyroid: No thyromegaly.  ?   Vascular: No carotid bruit.  ?Cardiovascular:  ?   Rate and Rhythm: Normal rate and regular rhythm.  ?   Heart sounds: Normal heart sounds. No murmur heard. ?Pulmonary:  ?   Effort: Pulmonary effort is normal. No respiratory distress.  ?   Breath sounds: Normal breath sounds. No wheezing or rales.  ?Musculoskeletal:     ?   General: Normal range of motion.  ?   Cervical back: Normal range of motion and neck supple.  ?   Right lower leg: No edema.  ?   Left lower leg: No edema.  ?Lymphadenopathy:  ?   Cervical: No cervical adenopathy.  ?Skin: ?   General: Skin is warm and dry.  ?   Findings: No erythema or rash.  ?Neurological:  ?   Mental Status: She is alert and oriented to person, place, and time.  ?Psychiatric:     ?   Behavior: Behavior normal.  ?   Comments: Well  groomed, good eye contact, normal speech and thoughts  ? ?Results for orders placed or performed in visit on 08/14/21  ?Hemoglobin A1c  ?Result Value Ref Range  ? Hemoglobin A1C 6.9   ? ?   ?Assessment & Plan:  ? ?Probl

## 2021-08-14 NOTE — Assessment & Plan Note (Signed)
Controlled, mild elevated SBP ?Home readings normal ?No known complications  ?Failed: Lisinopril (ACEi cough) ? ?  ?Plan:  ?1. Continue current BP regimen Losartan '50mg'$  daily ?2. Encourage improved lifestyle - low sodium diet, regular exercise ?3. Continue monitor BP outside office, bring readings to next visit, if persistently >140/90 or new symptoms notify office sooner ?

## 2021-08-15 ENCOUNTER — Other Ambulatory Visit: Payer: Self-pay | Admitting: Surgery

## 2021-08-15 ENCOUNTER — Encounter: Payer: Self-pay | Admitting: Surgery

## 2021-08-15 ENCOUNTER — Ambulatory Visit: Payer: Medicare Other | Admitting: Surgery

## 2021-08-15 VITALS — BP 138/84 | HR 82 | Temp 98.2°F | Ht 62.0 in | Wt 153.8 lb

## 2021-08-15 DIAGNOSIS — L720 Epidermal cyst: Secondary | ICD-10-CM

## 2021-08-15 DIAGNOSIS — D485 Neoplasm of uncertain behavior of skin: Secondary | ICD-10-CM

## 2021-08-15 DIAGNOSIS — L989 Disorder of the skin and subcutaneous tissue, unspecified: Secondary | ICD-10-CM

## 2021-08-15 DIAGNOSIS — L7211 Pilar cyst: Secondary | ICD-10-CM | POA: Diagnosis not present

## 2021-08-15 LAB — MICROALBUMIN / CREATININE URINE RATIO
Creatinine, Urine: 98 mg/dL (ref 20–275)
Microalb Creat Ratio: 38 mcg/mg creat — ABNORMAL HIGH (ref <30)
Microalb, Ur: 3.7 mg/dL

## 2021-08-15 NOTE — Patient Instructions (Addendum)
If you have any concerns or questions, please feel free to call our office. See follow up appointment below.  Epidermoid Cyst Removal, Care After  This sheet gives you information about how to care for yourself after your procedure. Your health care provider may also give you more specific instructions. If you have problems or questions, contact your health care provider. What can I expect after the procedure? After the procedure, it is common to have: Soreness in the area where your cyst was removed. Tightness or itchiness from the stitches (sutures) in your skin. Follow these instructions at home: Medicines Take over-the-counter and prescription medicines only as told by your health care provider. If you were prescribed an antibiotic medicine or ointment, take or apply it as told by your health care provider. Do not stop using the antibiotic even if you start to feel better. Incision care  Follow instructions from your health care provider about how to take care of your incision. Make sure you: Wash your hands with soap and water for at least 20 seconds before you change your bandage (dressing). If soap and water are not available, use hand sanitizer. Change your dressing as told by your health care provider. Leave sutures, skin glue, or adhesive strips in place. These skin closures may need to stay in place for 1-2 weeks or longer. If adhesive strip edges start to loosen and curl up, you may trim the loose edges. Do not remove adhesive strips completely unless your health care provider tells you to do that. Keep the dressing dry until your health care provider says that it can be removed. After your dressing is off, check your incision area every day for signs of infection. Check for: Redness, swelling, or pain. Fluid or blood. Warmth. Pus or a bad smell. General instructions Do not take baths, swim, or use a hot tub until your health care provider approves. Ask your health care provider  if you may take showers. You may only be allowed to take sponge baths. Your health care provider may ask you to avoid contact sports or activities that take a lot of effort. Do not do anything that stretches or puts pressure on your incision. You can return to your normal diet. Keep all follow-up visits. This is important. Contact a health care provider if: You have a fever. You have redness, swelling, or pain in the incision area. You have fluid or blood coming from your incision. You have pus or a bad smell coming from your incision. Your incision feels warm to the touch. Your cyst grows back. Get help right away if: If the incision site suddenly increases in size and you have pain at the incision site. You may be checked for a collection of blood under the skin from the procedure (hematoma). Summary After the procedure, it is common to have soreness in the area where your cyst was removed. Take or apply over-the-counter and prescription medicines only as told by your health care provider. Follow instructions from your health care provider about how to take care of your incision. This information is not intended to replace advice given to you by your health care provider. Make sure you discuss any questions you have with your health care provider. Document Revised: 07/13/2019 Document Reviewed: 07/13/2019 Elsevier Patient Education  2023 Elsevier Inc.   

## 2021-08-15 NOTE — Progress Notes (Signed)
Patient ID: Andrea Dodson, female   DOB: Jul 07, 1950, 71 y.o.   MRN: [id number] ? ?Chief Complaint: Dermal lesions on scalp ? ?History of Present Illness ?Andrea Dodson is a 71 y.o. female with dermal lesions present on scalp approximately 4 years.  No remarkable change in size. ?She got a new car recently and was under the steering wheel attempting to remove some protective tape, and bumped the lesion on the back of her head with the steering well which was excessively painful, concerned her greatly.  It was severe, and 9 out of 10.  She denied any drainage.  She she has never had known drainage from these lesions.  She is now concerned and would like to have this scalp lesion excised. ?  ?Past Medical History ?Past Medical History:  ?Diagnosis Date  ? Allergy   ? Arthritis   ? right knee  ? Diabetes mellitus, type 2 ([place])   ? Hypertension   ? Urinary incontinence   ? Wears dentures   ? full upper and lower  ?  ? ? ?Past Surgical History:  ?Procedure Laterality Date  ? APPENDECTOMY    ? CATARACT EXTRACTION W/PHACO Left 08/08/2019  ? Procedure: CATARACT EXTRACTION PHACO AND INTRAOCULAR LENS PLACEMENT (IOC) LEFT 4.59  00:32.3;  Surgeon: Eulogio Bear, MD;  Location: [facility];  Service: Ophthalmology;  Laterality: Left;  Diabetic - oral meds  ? CATARACT EXTRACTION W/PHACO Right 09/05/2019  ? Procedure: CATARACT EXTRACTION PHACO AND INTRAOCULAR LENS PLACEMENT (IOC) RIGHT DIABETIC 4.65  00:34.2;  Surgeon: Eulogio Bear, MD;  Location: [facility];  Service: Ophthalmology;  Laterality: Right;  Diabetic - oral meds  ? CHOLECYSTECTOMY  1993  ? CYSTOSCOPY W/ RETROGRADES Bilateral 07/14/2021  ? Procedure: CYSTOSCOPY WITH RETROGRADE PYELOGRAM;  Surgeon: Primus Bravo., MD;  Location: [facility];  Service: Urology;  Laterality: Bilateral;  ? CYSTOSCOPY/URETEROSCOPY/HOLMIUM LASER/STENT PLACEMENT Left 09/22/2020  ? Procedure: CYSTOSCOPY/URETEROSCOPY/HOLMIUM LASER/STENT PLACEMENT;  Surgeon: Festus Aloe, MD;  Location: [facility];  Service: Urology;  Laterality: Left;  ? TONSILLECTOMY    ? URETEROSCOPY WITH HOLMIUM LASER LITHOTRIPSY Left 07/14/2021  ? Procedure: URETEROSCOPY WITH LEFT STENT PLACEMENT;  Surgeon: Primus Bravo., MD;  Location: [facility];  Service: Urology;  Laterality: Left;  ? ? ?Allergies  ?Allergen Reactions  ? Codeine Nausea And Vomiting and Other (See Comments)  ?  Nausea vomiting,  Muscle weakness  ? Lisinopril Cough  ?  ACEi-cough  ? ? ?Current Outpatient Medications  ?Medication Sig Dispense Refill  ? aspirin EC 81 MG tablet Take 1 tablet (81 mg total) by mouth daily.    ? Blood Glucose Monitoring Suppl (ONE TOUCH ULTRA 2) w/Device KIT 1 device for checking blood sugar once daily 1 each 0  ? fluticasone (FLONASE) 50 MCG/ACT nasal spray Place 2 sprays into both nostrils daily. 16 g 6  ? glucose blood (ONE TOUCH ULTRA TEST) test strip CHECK BLOOD SUGAR ONCE DAILY 100 each 3  ? Lancets (ONETOUCH ULTRASOFT) lancets Use as instructed 100 each 12  ? loratadine (CLARITIN) 10 MG tablet Take 1 tablet (10 mg total) by mouth daily. 30 tablet 11  ? losartan (COZAAR) 50 MG tablet Take 1 tablet (50 mg total) by mouth daily. 90 tablet 3  ? metFORMIN (GLUCOPHAGE) 500 MG tablet Take 1 tablet (500 mg total) by mouth 2 (two) times daily with a meal. 180 tablet 3  ? naproxen (NAPROSYN) 500 MG tablet Take 1 tablet (500 mg total) by mouth 2 (two) times  daily with a meal. 20 tablet 2  ? phenazopyridine (PYRIDIUM) 200 MG tablet Take 1 tablet (200 mg total) by mouth 3 (three) times daily as needed for pain. 20 tablet 0  ? rosuvastatin (CRESTOR) 10 MG tablet Take 1 tablet (10 mg total) by mouth daily. 90 tablet 3  ? ?No current facility-administered medications for this visit.  ? ? ?Family History ?Family History  ?Problem Relation Age of Onset  ? Heart failure Mother   ? Heart disease Mother   ? Diabetes Mother   ? Heart attack Mother   ? Cancer Father   ?     lung  ? Heart attack Sister   ? Diabetes  Sister   ? Breast cancer Half-Sister   ?  ? ? ?Social History ?Social History  ? ?Tobacco Use  ? Smoking status: Never  ?  Passive exposure: Never  ? Smokeless tobacco: Never  ?Vaping Use  ? Vaping Use: Never used  ?Substance Use Topics  ? Alcohol use: No  ? Drug use: No  ?  ?  ? ? ?Review of Systems  ?Constitutional: Negative.   ?HENT: Negative.    ?Eyes: Negative.   ?Respiratory: Negative.    ?Cardiovascular: Negative.   ?Gastrointestinal: Negative.   ?Genitourinary: Negative.   ?Skin: Negative.   ?Neurological: Negative.   ?Psychiatric/Behavioral: Negative.    ?  ? ?Physical Exam ?Blood pressure 138/84, pulse 82, temperature 98.2 ?F (36.8 ?C), temperature source Oral, height _0  (1.575 m), weight 153 lb 12.8 oz (69.8 kg), SpO2 98 %. ?Last Weight  Most recent update: 08/15/2021  2:36 PM  ? ? Weight  ?69.8 kg (153 lb 12.8 oz)  ?      ? ?  ? ? ?CONSTITUTIONAL: Well developed, and nourished, appropriately responsive and aware without distress.   ?EYES: Sclera non-icteric.   ?EARS, NOSE, MOUTH AND THROAT: The oropharynx is clear. Oral mucosa is pink and moist.   Hearing is intact to voice.  ?NECK: Trachea is midline, and there is no jugular venous distension.  ?LYMPH NODES:  Lymph nodes in the neck are not enlarged. ?RESPIRATORY:  Lungs are clear, and breath sounds are equal bilaterally. Normal respiratory effort without pathologic use of accessory muscles. ?CARDIOVASCULAR: Heart is regular in rate and rhythm. ?GI: The abdomen is soft, nontender, and nondistended.  ?MUSCULOSKELETAL:  Symmetrical muscle tone appreciated in all four extremities.    ?SKIN: Skin turgor is normal. No pathologic skin lesions appreciated.  ?On the occipital area of her scalp there is a 1.5 cm raised dermal area, that is tender, discrete and well-defined.  There is another similar lesion where she parts her hair on the left parietal area.  This 1 is much less tender and has not been recently traumatized.  There is no evidence of discharge,  erythema or induration. ?NEUROLOGIC:  Motor and sensation appear grossly normal.  Cranial nerves are grossly without defect. ?PSYCH:  Alert and oriented to person, place and time. Affect is appropriate for situation. ? ?Data Reviewed ?I have personally reviewed what is currently available of the patient's imaging, recent labs and medical records.   ?Labs:  ? ?  Latest Ref Rng & Units 07/14/2021  ? 11:10 AM 01/28/2021  ?  8:03 AM 09/23/2020  ?  4:35 AM  ?CBC  ?WBC 4.0 - 10.5 K/uL 12.6   6.2   12.1    ?Hemoglobin 12.0 - 15.0 g/dL 12.5   11.3   11.4    ?Hematocrit 36.0 -  46.0 % 36.5   33.2   32.4    ?Platelets 150 - 400 K/uL 274   240   268    ? ? ?  Latest Ref Rng & Units 07/14/2021  ? 11:10 AM 01/28/2021  ?  8:03 AM 09/23/2020  ?  4:35 AM  ?CMP  ?Glucose 70 - 99 mg/dL 203   146   157    ?BUN 8 - 23 mg/dL _0 ?Creatinine 0.44 - 1.00 mg/dL 0.98   0.81   0.88    ?Sodium 135 - 145 mmol/L 132   136   135    ?Potassium 3.5 - 5.1 mmol/L 4.4   4.6   3.9    ?Chloride 98 - 111 mmol/L 100   103   109    ?CO2 22 - 32 mmol/L _1 ?Calcium 8.9 - 10.3 mg/dL 9.3   9.5   8.3    ?Total Protein 6.1 - 8.1 g/dL  6.6     ?Total Bilirubin 0.2 - 1.2 mg/dL  0.6     ?AST 10 - 35 U/L  16     ?ALT 6 - 29 U/L  6     ? ? ? ? ?Imaging: ? ?Within last 24 hrs: No results found. ? ?Assessment ?   ?Scalp lesion of uncertain etiology, 1.5 cm in diameter. ?Patient Active Problem List  ? Diagnosis Date Noted  ? Hydronephrosis with obstructing calculus 09/22/2020  ? Acute lower UTI 09/22/2020  ? Salivary gland swelling 02/11/2019  ? Environmental and seasonal allergies 08/12/2018  ? Hyperlipidemia associated with type 2 diabetes mellitus ([place]) 01/20/2017  ? Overweight (BMI 25.0-29.9) 07/11/2016  ? Type 2 diabetes mellitus with other specified complication ([place]) 70/26/3785  ? Essential hypertension 07/11/2016  ? Colon cancer screening 07/11/2016  ? H/O seasonal allergies 07/08/2016  ? Osteoarthritis of right knee 07/08/2016  ? Urinary  incontinence 07/08/2016  ? ? ?Plan ?   ?Excision of 1.5 cm scalp lesion, likely benign. ? ?With informed consent obtained, the patient was brought to our procedure room 9, where she was placed prone.  Her scalp

## 2021-08-18 DIAGNOSIS — Z7984 Long term (current) use of oral hypoglycemic drugs: Secondary | ICD-10-CM | POA: Diagnosis not present

## 2021-08-18 DIAGNOSIS — E1169 Type 2 diabetes mellitus with other specified complication: Secondary | ICD-10-CM | POA: Diagnosis not present

## 2021-08-18 DIAGNOSIS — I10 Essential (primary) hypertension: Secondary | ICD-10-CM

## 2021-08-18 DIAGNOSIS — E785 Hyperlipidemia, unspecified: Secondary | ICD-10-CM | POA: Diagnosis not present

## 2021-08-29 ENCOUNTER — Encounter: Payer: Self-pay | Admitting: Physician Assistant

## 2021-08-29 ENCOUNTER — Ambulatory Visit (INDEPENDENT_AMBULATORY_CARE_PROVIDER_SITE_OTHER): Payer: Medicare Other | Admitting: Physician Assistant

## 2021-08-29 VITALS — BP 140/88 | HR 88 | Temp 98.0°F | Wt 153.6 lb

## 2021-08-29 DIAGNOSIS — D485 Neoplasm of uncertain behavior of skin: Secondary | ICD-10-CM

## 2021-08-29 DIAGNOSIS — L989 Disorder of the skin and subcutaneous tissue, unspecified: Secondary | ICD-10-CM

## 2021-08-29 DIAGNOSIS — Z09 Encounter for follow-up examination after completed treatment for conditions other than malignant neoplasm: Secondary | ICD-10-CM | POA: Diagnosis not present

## 2021-08-29 DIAGNOSIS — L720 Epidermal cyst: Secondary | ICD-10-CM

## 2021-08-29 NOTE — Patient Instructions (Signed)
If you have any concerns or questions, please feel free to call our office. Follow up as needed.  ? ?Epidermoid Cyst Removal, Care After ?This sheet gives you information about how to care for yourself after your procedure. Your health care provider may also give you more specific instructions. If you have problems or questions, contact your health care provider. ?What can I expect after the procedure? ?After the procedure, it is common to have: ?Soreness in the area where your cyst was removed. ?Tightness or itchiness from the stitches (sutures) in your skin. ?Follow these instructions at home: ?Medicines ?Take over-the-counter and prescription medicines only as told by your health care provider. ?If you were prescribed an antibiotic medicine or ointment, take or apply it as told by your health care provider. Do not stop using the antibiotic even if you start to feel better. ?Incision care ? ?Follow instructions from your health care provider about how to take care of your incision. Make sure you: ?Wash your hands with soap and water for at least 20 seconds before you change your bandage (dressing). If soap and water are not available, use hand sanitizer. ?Change your dressing as told by your health care provider. ?Leave sutures, skin glue, or adhesive strips in place. These skin closures may need to stay in place for 1-2 weeks or longer. If adhesive strip edges start to loosen and curl up, you may trim the loose edges. Do not remove adhesive strips completely unless your health care provider tells you to do that. ?Keep the dressing dry until your health care provider says that it can be removed. ?After your dressing is off, check your incision area every day for signs of infection. Check for: ?Redness, swelling, or pain. ?Fluid or blood. ?Warmth. ?Pus or a bad smell. ?General instructions ?Do not take baths, swim, or use a hot tub until your health care provider approves. Ask your health care provider if you may  take showers. You may only be allowed to take sponge baths. ?Your health care provider may ask you to avoid contact sports or activities that take a lot of effort. Do not do anything that stretches or puts pressure on your incision. ?You can return to your normal diet. ?Keep all follow-up visits. This is important. ?Contact a health care provider if: ?You have a fever. ?You have redness, swelling, or pain in the incision area. ?You have fluid or blood coming from your incision. ?You have pus or a bad smell coming from your incision. ?Your incision feels warm to the touch. ?Your cyst grows back. ?Get help right away if: ?If the incision site suddenly increases in size and you have pain at the incision site. You may be checked for a collection of blood under the skin from the procedure (hematoma). ?Summary ?After the procedure, it is common to have soreness in the area where your cyst was removed. ?Take or apply over-the-counter and prescription medicines only as told by your health care provider. ?Follow instructions from your health care provider about how to take care of your incision. ?This information is not intended to replace advice given to you by your health care provider. Make sure you discuss any questions you have with your health care provider. ?Document Revised: 07/13/2019 Document Reviewed: 07/13/2019 ?Elsevier Patient Education ? [place]. ? ?

## 2021-08-29 NOTE — Progress Notes (Signed)
[facility] ?POST-OP OFFICE VISIT ? ?08/29/2021 ? ?HPI: ?Ina Scrivens is a 71 y.o. female 14 days s/p excision of 1.5 cm scalp lesion with Dr Christian Mate ? ?She is doing well ?No significant pain main complaint is glue n her hair ?No fever, chills, drainage ?No other issues ? ?Vital signs: ?BP 140/88   Pulse 88   Temp 98 ?F (36.7 ?C) (Oral)   Wt 153 lb 9.6 oz (69.7 kg)   SpO2 97%   BMI 28.09 kg/m?   ? ?Physical Exam: ?Constitutional: Well appearing female, NAD ?Skin: I&D to the scalp is CDI with dermabond, no erythema ? ?Assessment/Plan: ?This is a 71 y.o. female 14 days s/p excision of 1.5 cm scalp lesion ? ? - Pain control prn ? - Reviewed wound care recommendation ? - Reviewed surgical pathology; Benign cyst ? - She can follow up on as needed basis; She understands to call with questions/concerns ? ?-- ?Edison Simon, PA-C ?[facility] ?08/29/2021, 2:07 PM ?M-F: 7am - 4pm ? ?

## 2021-09-12 ENCOUNTER — Encounter: Payer: Self-pay | Admitting: Family Medicine

## 2021-09-13 ENCOUNTER — Telehealth: Payer: Medicare Other | Admitting: Physician Assistant

## 2021-09-13 DIAGNOSIS — J208 Acute bronchitis due to other specified organisms: Secondary | ICD-10-CM | POA: Diagnosis not present

## 2021-09-13 DIAGNOSIS — B9689 Other specified bacterial agents as the cause of diseases classified elsewhere: Secondary | ICD-10-CM

## 2021-09-13 MED ORDER — AZITHROMYCIN 250 MG PO TABS: ORAL_TABLET | ORAL | 0 refills | Status: AC

## 2021-09-13 MED ORDER — BENZONATATE 100 MG PO CAPS: 100.0000 mg | ORAL_CAPSULE | Freq: Three times a day (TID) | ORAL | 0 refills | Status: DC | PRN

## 2021-09-13 NOTE — Progress Notes (Signed)
I have spent 5 minutes in review of e-visit questionnaire, review and updating patient chart, medical decision making and response to patient.   Marin Wisner Cody Taunja Brickner, PA-C    

## 2021-09-13 NOTE — Progress Notes (Signed)

## 2021-09-25 ENCOUNTER — Encounter: Payer: Self-pay | Admitting: Family Medicine

## 2021-09-27 ENCOUNTER — Ambulatory Visit (INDEPENDENT_AMBULATORY_CARE_PROVIDER_SITE_OTHER): Payer: Medicare Other | Admitting: Family Medicine

## 2021-09-27 ENCOUNTER — Encounter: Payer: Self-pay | Admitting: Family Medicine

## 2021-09-27 VITALS — BP 112/74 | HR 70 | Ht 62.0 in | Wt 152.2 lb

## 2021-09-27 DIAGNOSIS — H6983 Other specified disorders of Eustachian tube, bilateral: Secondary | ICD-10-CM | POA: Diagnosis not present

## 2021-09-27 MED ORDER — PREDNISONE 20 MG PO TABS: ORAL_TABLET | ORAL | 0 refills | Status: DC

## 2021-09-27 MED ORDER — FLUTICASONE PROPIONATE 50 MCG/ACT NA SUSP
2.0000 | Freq: Every day | NASAL | 3 refills | Status: DC
Start: 2021-09-27 — End: 2022-06-11

## 2021-09-27 NOTE — Progress Notes (Signed)
Subjective:    Patient ID: Andrea Dodson, female    DOB: 12-04-1950, 71 y.o.   MRN: [id number]  Andrea Dodson is a 71 y.o. female presenting on 09/27/2021 for Ear Fullness   HPI  Sinusitis Eustachian Tube Dysfunction  E Visit on 5/26, unable to be seen in our office for acute, she was treated with Zpak azithromycin, tessalon perls, she has had persistent fluid pressure in ears. Pulsing sound with ear fullness some reduced hearing. She admits chronic issue years ago with problems R ear drum, she had TM injury from pin has had scar tissue on the ear Improving but has persistent ear pressure. Denies fever chills nausea vomiting productive cough       08/14/2021   10:36 AM 02/26/2021    9:30 AM 01/10/2021    1:24 PM  Depression screen PHQ 2/9  Decreased Interest 0 0 0  Down, Depressed, Hopeless 0 0 0  PHQ - 2 Score 0 0 0  Altered sleeping 0    Tired, decreased energy 0    Change in appetite 0    Feeling bad or failure about yourself  0    Trouble concentrating 0    Moving slowly or fidgety/restless 0    Suicidal thoughts 0    PHQ-9 Score 0    Difficult doing work/chores Not difficult at all      Social History   Tobacco Use   Smoking status: Never    Passive exposure: Never   Smokeless tobacco: Never  Vaping Use   Vaping Use: Never used  Substance Use Topics   Alcohol use: No   Drug use: No    Review of Systems Per HPI unless specifically indicated above     Objective:    BP 112/74   Pulse 70   Ht '5\' 2"'$  (1.575 m)   Wt 152 lb 3.2 oz (69 kg)   SpO2 99%   BMI 27.84 kg/m   Wt Readings from Last 3 Encounters:  09/27/21 152 lb 3.2 oz (69 kg)  08/29/21 153 lb 9.6 oz (69.7 kg)  08/15/21 153 lb 12.8 oz (69.8 kg)    Physical Exam Vitals and nursing note reviewed.  Constitutional:      General: She is not in acute distress.    Appearance: Normal appearance. She is well-developed. She is not diaphoretic.     Comments: Well-appearing, comfortable, cooperative   HENT:     Head: Normocephalic and atraumatic.     Right Ear: Ear canal and external ear normal. There is no impacted cerumen.     Left Ear: Ear canal and external ear normal. There is impacted cerumen.     Ears:     Comments: R TM with scar tissue, effusion. Eyes:     General:        Right eye: No discharge.        Left eye: No discharge.     Conjunctiva/sclera: Conjunctivae normal.  Cardiovascular:     Rate and Rhythm: Normal rate.  Pulmonary:     Effort: Pulmonary effort is normal.  Skin:    General: Skin is warm and dry.     Findings: No erythema or rash.  Neurological:     Mental Status: She is alert and oriented to person, place, and time.  Psychiatric:        Mood and Affect: Mood normal.        Behavior: Behavior normal.        Thought Content: Thought content  normal.     Comments: Well groomed, good eye contact, normal speech and thoughts    Results for orders placed or performed in visit on 08/14/21  Hemoglobin A1c  Result Value Ref Range   Hemoglobin A1C 6.9   Urine Microalbumin w/creat. ratio  Result Value Ref Range   Creatinine, Urine 98 20 - 275 mg/dL   Microalb, Ur 3.7 mg/dL   Microalb Creat Ratio 38 (H) <30 mcg/mg creat      Assessment & Plan:   Problem List Items Addressed This Visit   None Visit Diagnoses     Dysfunction of both eustachian tubes    -  Primary   Relevant Medications   predniSONE (DELTASONE) 20 MG tablet   fluticasone (FLONASE) 50 MCG/ACT nasal spray       Acute on chronic bilateral eustachian tube dysfunction with secondary R > L ear effusion, without loss of hearing or evidence of AOM or sinusitis. Likely related allergic rhinosinusitis based on exam and history. - Inadequate conservative therapy   Plan: 1. Start Flonase 2 sprays each nare daily for up to 4-6 weeks or longer 2. Start OTC oral anti-histamine daily 3. Recommend may consider trial OTC oral decongestant for up to 1 week or less 4. AVOID Afrin nasal  decongestant, counseled if uses, max dose up to 3 days or less, avoid rebound rhinitis 5. Add Prednisone taper 6 days  Future refer to ENT If not improved / Follow-up if not improved or worsening, return criteria    Meds ordered this encounter  Medications   predniSONE (DELTASONE) 20 MG tablet    Sig: Take 2 pills for dose '40mg'$  daily with meal for 3 days, then take 1 pill '20mg'$  daily with meal for 3 days.    Dispense:  9 tablet    Refill:  0   fluticasone (FLONASE) 50 MCG/ACT nasal spray    Sig: Place 2 sprays into both nostrils daily. Use for 4-6 weeks then stop and use seasonally or as needed.    Dispense:  16 g    Refill:  3    Follow up plan: Return if symptoms worsen or fail to improve.   Andrea Dodson, [facility] 09/27/2021, 10:49 AM

## 2021-09-27 NOTE — Patient Instructions (Addendum)
Thank you for coming to the office today.  You have some Eustachian Tube Dysfunction, this problem is usually caused by some deeper sinus swelling and pressure, causing difficulty of eustachian tubes to clear fluid from behind ear drum. You can have ear pain, pressure, fullness, loss of hearing. Often related to sinus symptoms and sometimes with sinusitis or infection or allergy symptoms.  Treatment: - Start using nasal steroid spray, Flonase 2 sprays in each nostril every day for at least 4-6 weeks, and maybe longer - Start OTC allergy medicine (Claritin, Zyrtec, or Allegra - or generics) once daily - Start Prednisone oral taper 6 days  For significant ear/sinus pressure, try OTC decongestant such as Sudafed, show your ID to pharmacist get the original version OTC, can do every 4-6 hours or the long acting 12 hr version. Usually for about 7 days  - AVOID Afrin nasal spray, if you use this do not use more than 3 days or can make sinus swelling worse  If any significant worsening, loss of hearing, constant pain, fever/chills, or concern for infection - notify office and we can send in an antibiotic. Or if just persistent pressure that is not improving, you may contact me back within 1 week and we can consider a brief course of oral steroid prednisone for 3 days only to help reduce swelling, as discussed this is not ideal treatment and can cause side effects.  If needed  [facility] [address]  [place], [place] [address] Ph: [phone]  [facility] [id number]  [place], [place] [address] Ph: [phone]    Please schedule a Follow-up Appointment to: Return if symptoms worsen or fail to improve.  If you have any other questions or concerns, please feel free to call the office or send a message through [place]. You may also schedule an earlier appointment if necessary.  Additionally, you may be receiving a survey about your  experience at our office within a few days to 1 week by e-mail or mail. We value your feedback.  Andrea Putnam, DO [facility], [place]  Eustachian Tube Dysfunction  Eustachian tube dysfunction refers to a condition in which a blockage develops in the narrow passage that connects the middle ear to the back of the nose (eustachian tube). The eustachian tube regulates air pressure in the middle ear by letting air move between the ear and nose. It also helps to drain fluid from the middle ear space. Eustachian tube dysfunction can affect one or both ears. When the eustachian tube does not function properly, air pressure, fluid, or both can build up in the middle ear. What are the causes? This condition occurs when the eustachian tube becomes blocked or cannot open normally. Common causes of this condition include: Ear infections. Colds and other infections that affect the nose, mouth, and throat (upper respiratory tract). Allergies. Irritation from cigarette smoke. Irritation from stomach acid coming up into the esophagus (gastroesophageal reflux). The esophagus is the part of the body that moves food from the mouth to the stomach. Sudden changes in air pressure, such as from descending in an airplane or scuba diving. Abnormal growths in the nose or throat, such as: Growths that line the nose (nasal polyps). Abnormal growth of cells (tumors). Enlarged tissue at the back of the throat (adenoids). What increases the risk? You are more likely to develop this condition if: You smoke. You are overweight. You are a child who has: Certain  birth defects of the mouth, such as cleft palate. Large tonsils or adenoids. What are the signs or symptoms? Common symptoms of this condition include: A feeling of fullness in the ear. Ear pain. Clicking or popping noises in the ear. Ringing in the ear (tinnitus). Hearing loss. Loss of balance. Dizziness. Symptoms may get worse when  the air pressure around you changes, such as when you travel to an area of high elevation, fly on an airplane, or go scuba diving. How is this diagnosed? This condition may be diagnosed based on: Your symptoms. A physical exam of your ears, nose, and throat. Tests, such as those that measure: The movement of your eardrum. Your hearing (audiometry). How is this treated? Treatment depends on the cause and severity of your condition. In mild cases, you may relieve your symptoms by moving air into your ears. This is called "popping the ears." In more severe cases, or if you have symptoms of fluid in your ears, treatment may include: Medicines to relieve congestion (decongestants). Medicines that treat allergies (antihistamines). Nasal sprays or ear drops that contain medicines that reduce swelling (steroids). A procedure to drain the fluid in your eardrum. In this procedure, a small tube may be placed in the eardrum to: Drain the fluid. Restore the air in the middle ear space. A procedure to insert a balloon device through the nose to inflate the opening of the eustachian tube (balloon dilation). Follow these instructions at home: Lifestyle Do not do any of the following until your health care provider approves: Travel to high altitudes. Fly in airplanes. Work in a Pension scheme manager or room. Scuba dive. Do not use any products that contain nicotine or tobacco. These products include cigarettes, chewing tobacco, and vaping devices, such as e-cigarettes. If you need help quitting, ask your health care provider. Keep your ears dry. Wear fitted earplugs during showering and bathing. Dry your ears completely after. General instructions Take over-the-counter and prescription medicines only as told by your health care provider. Use techniques to help pop your ears as recommended by your health care provider. These may include: Chewing gum. Yawning. Frequent, forceful swallowing. Closing your  mouth, holding your nose closed, and gently blowing as if you are trying to blow air out of your nose. Keep all follow-up visits. This is important. Contact a health care provider if: Your symptoms do not go away after treatment. Your symptoms come back after treatment. You are unable to pop your ears. You have: A fever. Pain in your ear. Pain in your head or neck. Fluid draining from your ear. Your hearing suddenly changes. You become very dizzy. You lose your balance. Get help right away if: You have a sudden, severe increase in any of your symptoms. Summary Eustachian tube dysfunction refers to a condition in which a blockage develops in the eustachian tube. It can be caused by ear infections, allergies, inhaled irritants, or abnormal growths in the nose or throat. Symptoms may include ear pain or fullness, hearing loss, or ringing in the ears. Mild cases are treated with techniques to unblock the ears, such as yawning or chewing gum. More severe cases are treated with medicines or procedures. This information is not intended to replace advice given to you by your health care provider. Make sure you discuss any questions you have with your health care provider. Document Revised: 06/18/2020 Document Reviewed: 06/18/2020 [employer] Patient Education  [employer].

## 2021-10-03 ENCOUNTER — Ambulatory Visit (INDEPENDENT_AMBULATORY_CARE_PROVIDER_SITE_OTHER): Payer: Medicare Other

## 2021-10-03 ENCOUNTER — Telehealth: Payer: Medicare Other

## 2021-10-03 DIAGNOSIS — N3001 Acute cystitis with hematuria: Secondary | ICD-10-CM

## 2021-10-03 DIAGNOSIS — E1169 Type 2 diabetes mellitus with other specified complication: Secondary | ICD-10-CM

## 2021-10-03 DIAGNOSIS — I10 Essential (primary) hypertension: Secondary | ICD-10-CM

## 2021-10-03 NOTE — Chronic Care Management (AMB) (Signed)
Chronic Care Management   CCM RN Visit Note  10/03/2021 Name: Andrea Dodson MRN: [id number] DOB: December 17, 1950  Subjective: Andrea Dodson is a 71 y.o. year old female who is a primary care patient of Olin Hauser, DO. The care management team was consulted for assistance with disease management and care coordination needs.    Engaged with patient by telephone for follow up visit in response to provider referral for case management and/or care coordination services.   Consent to Services:  The patient was given information about Chronic Care Management services, agreed to services, and gave verbal consent prior to initiation of services.  Please see initial visit note for detailed documentation.   Patient agreed to services and verbal consent obtained.   Assessment: Review of patient past medical history, allergies, medications, health status, including review of consultants reports, laboratory and other test data, was performed as part of comprehensive evaluation and provision of chronic care management services.   SDOH (Social Determinants of Health) assessments and interventions performed:    CCM Care Plan  Allergies  Allergen Reactions   Codeine Nausea And Vomiting and Other (See Comments)    Nausea vomiting,  Muscle weakness   Lisinopril Cough    ACEi-cough    Outpatient Encounter Medications as of 10/03/2021  Medication Sig   aspirin EC 81 MG tablet Take 1 tablet (81 mg total) by mouth daily.   Blood Glucose Monitoring Suppl (ONE TOUCH ULTRA 2) w/Device KIT 1 device for checking blood sugar once daily   fluticasone (FLONASE) 50 MCG/ACT nasal spray Place 2 sprays into both nostrils daily. Use for 4-6 weeks then stop and use seasonally or as needed.   glucose blood (ONE TOUCH ULTRA TEST) test strip CHECK BLOOD SUGAR ONCE DAILY   Lancets (ONETOUCH ULTRASOFT) lancets Use as instructed   loratadine (CLARITIN) 10 MG tablet Take 1 tablet (10 mg total) by mouth daily.    losartan (COZAAR) 50 MG tablet Take 1 tablet (50 mg total) by mouth daily.   metFORMIN (GLUCOPHAGE) 500 MG tablet Take 1 tablet (500 mg total) by mouth 2 (two) times daily with a meal.   naproxen (NAPROSYN) 500 MG tablet Take 1 tablet (500 mg total) by mouth 2 (two) times daily with a meal.   phenazopyridine (PYRIDIUM) 200 MG tablet Take 1 tablet (200 mg total) by mouth 3 (three) times daily as needed for pain.   predniSONE (DELTASONE) 20 MG tablet Take 2 pills for dose 50m daily with meal for 3 days, then take 1 pill 288mdaily with meal for 3 days.   rosuvastatin (CRESTOR) 10 MG tablet Take 1 tablet (10 mg total) by mouth daily.   No facility-administered encounter medications on file as of 10/03/2021.    Patient Active Problem List   Diagnosis Date Noted   Lesion of skin of scalp 08/15/2021   Epidermal inclusion cyst 08/15/2021   Hydronephrosis with obstructing calculus 09/22/2020   Acute lower UTI 09/22/2020   Salivary gland swelling 02/11/2019   Environmental and seasonal allergies 08/12/2018   Hyperlipidemia associated with type 2 diabetes mellitus ([facility]/05/2016   Overweight (BMI 25.0-29.9) 07/11/2016   Type 2 diabetes mellitus with other specified complication ([facility]160 Essential hypertension 07/11/2016   Colon cancer screening 07/11/2016   H/O seasonal allergies 07/08/2016   Osteoarthritis of right knee 07/08/2016   Urinary incontinence 07/08/2016    Conditions to be addressed/monitored:HTN, HLD, DMII, and kidney stones  Care Plan : RNCM: General Plan of Care (Adult) for  Chronic Disease Management and Care Coordination Needs  Updates made by Vanita Ingles, RN since 10/03/2021 12:00 AM  Completed 10/03/2021   Problem: RNCM: Development of plan of care for Chronic Disease Management (HTN, HLD, DM) Resolved 10/03/2021  Priority: High     Long-Range Goal: RNCM: Development of plan of care for Chronic Disease Management (HTN, HLD, DM) Completed 10/03/2021  Start  Date: 03/21/2021  Expected End Date: 03/21/2022  Priority: High  Note:   Current Barriers: Patient has met the plan of care and goals of care. Closing goals and plan of care. 10-03-2021 Knowledge Deficits related to plan of care for management of HTN, HLD, and DMII  Chronic Disease Management support and education needs related to HTN, HLD, and DMII  RNCM Clinical Goal(s):  Patient will verbalize basic understanding of HTN, HLD, and DMII disease process and self health management plan as evidenced by verbalizing understanding, asking for clarification when not understanding the plan of care and working with the pcp and CCM team to optimize health and well being take all medications exactly as prescribed and will call provider for medication related questions as evidenced by compliance with medications and calling for refill needs before running out of medications     attend all scheduled medical appointments: 02-26-2022 at 1040 am as evidenced by keeping appointments and calling for schedule change needs.  demonstrate improved and ongoing adherence to prescribed treatment plan for HTN, HLD, and DMII as evidenced by keeping appointments, following dietary restrictions, taking medications, and calling the office for changes  demonstrate ongoing self health care management ability for effective management of chronic conditions  as evidenced by  working with the CCM team through collaboration with Consulting civil engineer, provider, and care team.   Interventions: 1:1 collaboration with primary care provider regarding development and update of comprehensive plan of care as evidenced by provider attestation and co-signature Inter-disciplinary care team collaboration (see longitudinal plan of care) Evaluation of current treatment plan related to  self management and patient's adherence to plan as established by provider   Diabetes:  (Status: Goal Met.) Long Term Goal  10-03-2021: The patient is at goal. The patient  is doing well with her DM management. Closing goal and care plan for DM  Lab Results  Component Value Date   HGBA1C 6.9 08/06/2021  Assessed patient's understanding of A1c goal: <7%. 10-03-2021: Review of goals of A1C. Patient is at goal at this time.  Provided education to patient about basic DM disease process; Reviewed medications with patient and discussed importance of medication adherence. 10-03-2021: The patient is adherent to medications        Reviewed prescribed diet with patient heart healthy/ADA. 10-03-2021: The patient is compliant with heart healthy diet, denies any acute changes with dietary habits. Tries to drink a lot of water and stay hydrated Counseled on importance of regular laboratory monitoring as prescribed. 10-03-2021: Has regular lab testing. Praised for good control of A1C;        Discussed plans with patient for ongoing care management follow up and provided patient with direct contact information for care management team;      Provided patient with written educational materials related to hypo and hyperglycemia and importance of correct treatment. 10-03-2021: The patient denies any lows. States her blood sugar is pretty consistent.        Reviewed scheduled/upcoming provider appointments including: 02-26-2022 at 1040 am;         Advised patient, providing education and rationale, to check  cbg when you have symptoms of low or high blood sugar, before and after exercise, and as directed   and record. 07-25-2021: The patient checks her blood sugars in the am, fasting. States the range is 140 to 150 fasting consistently. Review of goals of blood sugars: fasting <130 and post prandial of <180.   10-03-2021: The patient states her blood sugar this am was 127. The patient says her range is usually in the 120's around 127 to 129. call provider for findings outside established parameters;       Review of patient status, including review of consultants reports, relevant laboratory and other  test results, and medications completed;       Screening for signs and symptoms of depression related to chronic disease state;        Assessed social determinant of health barriers;     Eye Exam: 05-16-2021: Last eye exam was July of 2022. The patient has eyes checked yearly. Denies any issues at this time.      Hyperlipidemia:  (Status: Goal Met.) Long Term Goal  10-03-2021: Goals have been met and care plan being closed. Condition is stable Lab Results  Component Value Date   CHOL 121 01/28/2021   HDL 45 (L) 01/28/2021   LDLCALC 51 01/28/2021   TRIG 171 (H) 01/28/2021   CHOLHDL 2.7 01/28/2021     Medication review performed; medication list updated in electronic medical record. 10-03-2021: The patient takes Crestor 10 mg daily. Is compliant with medications Provider established cholesterol goals reviewed; Counseled on importance of regular laboratory monitoring as prescribed. 10-03-2021: Has regular lab work. Will have new labs drawn in November of 2023 Provided HLD educational materials; Reviewed role and benefits of statin for ASCVD risk reduction; Discussed strategies to manage statin-induced myalgias; Reviewed importance of limiting foods high in cholesterol. 10-03-2021:Education and support given of avoiding foods high in cholesterol;  Hypertension: (Status: Goal Not Met.) 10-03-2021: The goals for HTN have been met and the care plan is being closed.  Last practice recorded BP readings:  BP Readings from Last 3 Encounters:  09/27/21 112/74  08/29/21 140/88  08/15/21 138/84  Most recent eGFR/CrCl:  Lab Results  Component Value Date   EGFR 79 01/28/2021    No components found for: CRCL  Evaluation of current treatment plan related to hypertension self management and patient's adherence to plan as established by provider. 07-25-2021: The patient is having stable blood pressures. Denies any issues with HTN or heart health;   Provided education to patient re: stroke prevention, s/s of  heart attack and stroke; Reviewed prescribed diet heart healthy/ADA diet  Reviewed medications with patient and discussed importance of compliance. 07-25-2021: The patient is compliant with medications.   Discussed plans with patient for ongoing care management follow up and provided patient with direct contact information for care management team; Advised patient, providing education and rationale, to monitor blood pressure daily and record, calling PCP for findings outside established parameters;  Advised patient to discuss blood pressure trends  with provider; Provided education on prescribed diet heart healthy/ADA;  Discussed complications of poorly controlled blood pressure such as heart disease, stroke, circulatory complications, vision complications, kidney impairment, sexual dysfunction;    Ureteral Calculus  (Status: Goal Met.) Long Term Goal  10-03-2021: The patient has had resolution of kidney stones and has not had any additional issues. Goal of care being closed at this time. Knows to call for changes. Evaluation of current treatment plan related to  Ureteral Calculus ,  self-management and patient's adherence to plan as established by provider. Discussed plans with patient for ongoing care management follow up and provided patient with direct contact information for care management team Advised patient to call the provider for changes in urinary health or complication related kidney stones; Provided education to patient re: information about kidney stones, testing to see what the stones are made of and prevention measures; Reviewed medications with patient and discussed compliance. The patient is doing well now. No pain or discomfort. Followed up recently with the specialist and will see again in July for further follow up and recommendations. ; Reviewed scheduled/upcoming provider appointments including 02-26-2022 at 1040 am with pcp; Discussed plans with patient for ongoing care  management follow up and provided patient with direct contact information for care management team; Advised patient to discuss concerns about urinary health and kidney stones  with provider;   Patient Goals/Self-Care Activities: Take medications as prescribed   Attend all scheduled provider appointments Call pharmacy for medication refills 3-7 days in advance of running out of medications Attend church or other social activities Perform all self care activities independently  Perform IADL's (shopping, preparing meals, housekeeping, managing finances) independently Call provider office for new concerns or questions  Work with the social worker to address care coordination needs and will continue to work with the clinical team to address health care and disease management related needs call the Suicide and Crisis Lifeline: 988 call the [place] National Suicide Prevention Lifeline: 310-579-4854 or TTY: 401-004-7621 TTY 636-810-3970) to talk to a trained counselor call 1-800-273-TALK (toll free, 24 hour hotline) if experiencing a Mental Health or [place]  schedule appointment with eye doctor- Has an appointment for eye doctor in July of 2023 check blood sugar at prescribed times: before meals and at bedtime, when you have symptoms of low or high blood sugar, and before and after exercise check feet daily for cuts, sores or redness enter blood sugar readings and medication or insulin into daily log take the blood sugar log to all doctor visits trim toenails straight across drink 6 to 8 glasses of water each day eat fish at least once per week fill half of plate with vegetables limit fast food meals to no more than 1 per week manage portion size prepare main meal at home 3 to 5 days each week read food labels for fat, fiber, carbohydrates and portion size keep feet up while sitting wash and dry feet carefully every day wear comfortable, cotton socks wear comfortable,  well-fitting shoes check blood pressure weekly choose a place to take my blood pressure (home, clinic or office, retail store) write blood pressure results in a log or diary learn about high blood pressure keep a blood pressure log take blood pressure log to all doctor appointments call doctor for signs and symptoms of high blood pressure develop an action plan for high blood pressure keep all doctor appointments take medications for blood pressure exactly as prescribed report new symptoms to your doctor eat more whole grains, fruits and vegetables, lean meats and healthy fats - call for medicine refill 2 or 3 days before it runs out - take all medications exactly as prescribed - call doctor with any symptoms you believe are related to your medicine - call doctor when you experience any new symptoms - go to all doctor appointments as scheduled - adhere to prescribed diet: heart healthy/ADA diet        Plan:No further follow up required: The patient has  met the goals of care and is stable. Has the RNCM number to call for changes, new needs or concerns. Reviewed with the patient and the patient verbalized understanding.  Noreene Larsson RN, MSN, [facility] Mobile: [phone]

## 2021-10-03 NOTE — Patient Instructions (Signed)
Visit Information  Thank you for taking time to visit with me today. Please don't hesitate to contact me if I can be of assistance to you before our next scheduled telephone appointment.  Following are the goals we discussed today:  Diabetes:  (Status: Goal Met.) Long Term Goal  10-03-2021: The patient is at goal. The patient is doing well with her DM management. Closing goal and care plan for DM        Lab Results  Component Value Date    HGBA1C 6.9 08/06/2021  Assessed patient's understanding of A1c goal: <7%. 10-03-2021: Review of goals of A1C. Patient is at goal at this time.  Provided education to patient about basic DM disease process; Reviewed medications with patient and discussed importance of medication adherence. 10-03-2021: The patient is adherent to medications        Reviewed prescribed diet with patient heart healthy/ADA. 10-03-2021: The patient is compliant with heart healthy diet, denies any acute changes with dietary habits. Tries to drink a lot of water and stay hydrated Counseled on importance of regular laboratory monitoring as prescribed. 10-03-2021: Has regular lab testing. Praised for good control of A1C;        Discussed plans with patient for ongoing care management follow up and provided patient with direct contact information for care management team;      Provided patient with written educational materials related to hypo and hyperglycemia and importance of correct treatment. 10-03-2021: The patient denies any lows. States her blood sugar is pretty consistent.        Reviewed scheduled/upcoming provider appointments including: 02-26-2022 at 1040 am;         Advised patient, providing education and rationale, to check cbg when you have symptoms of low or high blood sugar, before and after exercise, and as directed   and record. 07-25-2021: The patient checks her blood sugars in the am, fasting. States the range is 140 to 150 fasting consistently. Review of goals of blood sugars:  fasting <130 and post prandial of <180.   10-03-2021: The patient states her blood sugar this am was 127. The patient says her range is usually in the 120's around 127 to 129. call provider for findings outside established parameters;       Review of patient status, including review of consultants reports, relevant laboratory and other test results, and medications completed;       Screening for signs and symptoms of depression related to chronic disease state;        Assessed social determinant of health barriers;     Eye Exam: 05-16-2021: Last eye exam was July of 2022. The patient has eyes checked yearly. Denies any issues at this time.       Hyperlipidemia:  (Status: Goal Met.) Long Term Goal  10-03-2021: Goals have been met and care plan being closed. Condition is stable      Lab Results  Component Value Date    CHOL 121 01/28/2021    HDL 45 (L) 01/28/2021    LDLCALC 51 01/28/2021    TRIG 171 (H) 01/28/2021    CHOLHDL 2.7 01/28/2021      Medication review performed; medication list updated in electronic medical record. 10-03-2021: The patient takes Crestor 10 mg daily. Is compliant with medications Provider established cholesterol goals reviewed; Counseled on importance of regular laboratory monitoring as prescribed. 10-03-2021: Has regular lab work. Will have new labs drawn in November of 2023 Provided HLD educational materials; Reviewed role and benefits of  statin for ASCVD risk reduction; Discussed strategies to manage statin-induced myalgias; Reviewed importance of limiting foods high in cholesterol. 10-03-2021:Education and support given of avoiding foods high in cholesterol;   Hypertension: (Status: Goal Not Met.) 10-03-2021: The goals for HTN have been met and the care plan is being closed.  Last practice recorded BP readings:     BP Readings from Last 3 Encounters:  09/27/21 112/74  08/29/21 140/88  08/15/21 138/84  Most recent eGFR/CrCl:       Lab Results  Component Value  Date    EGFR 79 01/28/2021    No components found for: CRCL   Evaluation of current treatment plan related to hypertension self management and patient's adherence to plan as established by provider. 07-25-2021: The patient is having stable blood pressures. Denies any issues with HTN or heart health;   Provided education to patient re: stroke prevention, s/s of heart attack and stroke; Reviewed prescribed diet heart healthy/ADA diet  Reviewed medications with patient and discussed importance of compliance. 07-25-2021: The patient is compliant with medications.   Discussed plans with patient for ongoing care management follow up and provided patient with direct contact information for care management team; Advised patient, providing education and rationale, to monitor blood pressure daily and record, calling PCP for findings outside established parameters;  Advised patient to discuss blood pressure trends  with provider; Provided education on prescribed diet heart healthy/ADA;  Discussed complications of poorly controlled blood pressure such as heart disease, stroke, circulatory complications, vision complications, kidney impairment, sexual dysfunction;      Ureteral Calculus  (Status: Goal Met.) Long Term Goal  10-03-2021: The patient has had resolution of kidney stones and has not had any additional issues. Goal of care being closed at this time. Knows to call for changes. Evaluation of current treatment plan related to  Ureteral Calculus ,  self-management and patient's adherence to plan as established by provider. Discussed plans with patient for ongoing care management follow up and provided patient with direct contact information for care management team Advised patient to call the provider for changes in urinary health or complication related kidney stones; Provided education to patient re: information about kidney stones, testing to see what the stones are made of and prevention  measures; Reviewed medications with patient and discussed compliance. The patient is doing well now. No pain or discomfort. Followed up recently with the specialist and will see again in July for further follow up and recommendations. ; Reviewed scheduled/upcoming provider appointments including 02-26-2022 at 1040 am with pcp; Discussed plans with patient for ongoing care management follow up and provided patient with direct contact information for care management team; Advised patient to discuss concerns about urinary health and kidney stones  with provider;   Our next appointment is by telephone on 02-26-2022 at 1040 am  Please call the care guide team at [phone] if you need to cancel or reschedule your appointment.   If you are experiencing a Mental Health or [place] or need someone to talk to, please call the Suicide and Crisis Lifeline: 988 call the [place] National Suicide Prevention Lifeline: 816-139-5258 or TTY: (641) 776-5077 TTY 530-507-6089) to talk to a trained counselor call 1-800-273-TALK (toll free, 24 hour hotline)   Patient verbalizes understanding of instructions and care plan provided today and agrees to view in [place]. Active MyChart status and patient understanding of how to access instructions and care plan via MyChart confirmed with patient.     Noreene Larsson RN, MSN,  [facility] Mobile: [phone]

## 2021-10-04 ENCOUNTER — Encounter: Payer: Self-pay | Admitting: Family Medicine

## 2021-10-04 DIAGNOSIS — H65491 Other chronic nonsuppurative otitis media, right ear: Secondary | ICD-10-CM

## 2021-10-04 DIAGNOSIS — H938X1 Other specified disorders of right ear: Secondary | ICD-10-CM

## 2021-10-04 DIAGNOSIS — H6983 Other specified disorders of Eustachian tube, bilateral: Secondary | ICD-10-CM

## 2021-10-16 DIAGNOSIS — M9902 Segmental and somatic dysfunction of thoracic region: Secondary | ICD-10-CM | POA: Diagnosis not present

## 2021-10-16 DIAGNOSIS — M542 Cervicalgia: Secondary | ICD-10-CM | POA: Diagnosis not present

## 2021-10-16 DIAGNOSIS — M9901 Segmental and somatic dysfunction of cervical region: Secondary | ICD-10-CM | POA: Diagnosis not present

## 2021-10-16 DIAGNOSIS — M40292 Other kyphosis, cervical region: Secondary | ICD-10-CM | POA: Diagnosis not present

## 2021-10-18 DIAGNOSIS — E785 Hyperlipidemia, unspecified: Secondary | ICD-10-CM | POA: Diagnosis not present

## 2021-10-18 DIAGNOSIS — Z7984 Long term (current) use of oral hypoglycemic drugs: Secondary | ICD-10-CM

## 2021-10-18 DIAGNOSIS — I10 Essential (primary) hypertension: Secondary | ICD-10-CM | POA: Diagnosis not present

## 2021-10-18 DIAGNOSIS — E1159 Type 2 diabetes mellitus with other circulatory complications: Secondary | ICD-10-CM

## 2021-10-18 DIAGNOSIS — M9901 Segmental and somatic dysfunction of cervical region: Secondary | ICD-10-CM | POA: Diagnosis not present

## 2021-10-18 DIAGNOSIS — M40292 Other kyphosis, cervical region: Secondary | ICD-10-CM | POA: Diagnosis not present

## 2021-10-18 DIAGNOSIS — M9902 Segmental and somatic dysfunction of thoracic region: Secondary | ICD-10-CM | POA: Diagnosis not present

## 2021-10-18 DIAGNOSIS — M542 Cervicalgia: Secondary | ICD-10-CM | POA: Diagnosis not present

## 2021-10-21 DIAGNOSIS — M9902 Segmental and somatic dysfunction of thoracic region: Secondary | ICD-10-CM | POA: Diagnosis not present

## 2021-10-21 DIAGNOSIS — M542 Cervicalgia: Secondary | ICD-10-CM | POA: Diagnosis not present

## 2021-10-21 DIAGNOSIS — M9901 Segmental and somatic dysfunction of cervical region: Secondary | ICD-10-CM | POA: Diagnosis not present

## 2021-10-21 DIAGNOSIS — M40292 Other kyphosis, cervical region: Secondary | ICD-10-CM | POA: Diagnosis not present

## 2021-10-23 DIAGNOSIS — M9902 Segmental and somatic dysfunction of thoracic region: Secondary | ICD-10-CM | POA: Diagnosis not present

## 2021-10-23 DIAGNOSIS — M542 Cervicalgia: Secondary | ICD-10-CM | POA: Diagnosis not present

## 2021-10-23 DIAGNOSIS — M9901 Segmental and somatic dysfunction of cervical region: Secondary | ICD-10-CM | POA: Diagnosis not present

## 2021-10-23 DIAGNOSIS — M40292 Other kyphosis, cervical region: Secondary | ICD-10-CM | POA: Diagnosis not present

## 2021-10-24 ENCOUNTER — Other Ambulatory Visit: Payer: Self-pay | Admitting: Family Medicine

## 2021-10-24 DIAGNOSIS — E1169 Type 2 diabetes mellitus with other specified complication: Secondary | ICD-10-CM

## 2021-10-24 NOTE — Telephone Encounter (Signed)
Requested medication (s) are due for refill today: no  Requested medication (s) are on the active medication list: yes  Last refill:  08/14/21  Future visit scheduled: yes  Notes to clinic:  Unable to refill per protocol, request is too soon. Last refill was 08/14/21 for 90 and 3 refills.     Requested Prescriptions  Pending Prescriptions Disp Refills   rosuvastatin (CRESTOR) 10 MG tablet [Pharmacy Med Name: ROSUVASTATIN '10MG'$  TABLETS] 90 tablet 3    Sig: TAKE 1 TABLET BY MOUTH DAILY     Cardiovascular:  Antilipid - Statins 2 Failed - 10/24/2021  8:03 AM      Failed - Lipid Panel in normal range within the last 12 months    Cholesterol, Total  Date Value Ref Range Status  07/15/2016 202 (H) 100 - 199 mg/dL Final   Cholesterol  Date Value Ref Range Status  01/28/2021 121 <200 mg/dL Final   LDL Cholesterol (Calc)  Date Value Ref Range Status  01/28/2021 51 mg/dL (calc) Final    Comment:    Reference range: <100 . Desirable range <100 mg/dL for primary prevention;   <70 mg/dL for patients with CHD or diabetic patients  with > or = 2 CHD risk factors. Marland Kitchen LDL-C is now calculated using the Martin-Hopkins  calculation, which is a validated novel method providing  better accuracy than the Friedewald equation in the  estimation of LDL-C.  Cresenciano Genre et al. Annamaria Helling. 9242;683(41): 2061-2068  (http://education.[facility].com/faq/FAQ164)    HDL  Date Value Ref Range Status  01/28/2021 45 (L) > OR = 50 mg/dL Final  07/15/2016 50 >39 mg/dL Final   Triglycerides  Date Value Ref Range Status  01/28/2021 171 (H) <150 mg/dL Final         Passed - Cr in normal range and within 360 days    Creat  Date Value Ref Range Status  01/28/2021 0.81 0.50 - 1.05 mg/dL Final   Creatinine, Ser  Date Value Ref Range Status  07/14/2021 0.98 0.44 - 1.00 mg/dL Final   Creatinine, Urine  Date Value Ref Range Status  08/14/2021 98 20 - 275 mg/dL Final         Passed - Patient is not  pregnant      Passed - Valid encounter within last 12 months    Recent Outpatient Visits           3 weeks ago Dysfunction of both eustachian tubes   [place], DO   2 months ago Type 2 diabetes mellitus with other specified complication, without long-term current use of insulin ([place])   [facility], Devonne Doughty, DO   8 months ago Encounter for [employer] annual wellness exam   [facility], Devonne Doughty, DO   8 months ago Annual physical exam   [facility] Olin Hauser, DO   1 year ago Left nephrolithiasis   [place], DO       Future Appointments             In 4 months [facility], Devonne Doughty, [facility]

## 2021-10-25 DIAGNOSIS — M40292 Other kyphosis, cervical region: Secondary | ICD-10-CM | POA: Diagnosis not present

## 2021-10-25 DIAGNOSIS — M9902 Segmental and somatic dysfunction of thoracic region: Secondary | ICD-10-CM | POA: Diagnosis not present

## 2021-10-25 DIAGNOSIS — M542 Cervicalgia: Secondary | ICD-10-CM | POA: Diagnosis not present

## 2021-10-25 DIAGNOSIS — M9901 Segmental and somatic dysfunction of cervical region: Secondary | ICD-10-CM | POA: Diagnosis not present

## 2021-10-28 DIAGNOSIS — M9902 Segmental and somatic dysfunction of thoracic region: Secondary | ICD-10-CM | POA: Diagnosis not present

## 2021-10-28 DIAGNOSIS — M40292 Other kyphosis, cervical region: Secondary | ICD-10-CM | POA: Diagnosis not present

## 2021-10-28 DIAGNOSIS — M9901 Segmental and somatic dysfunction of cervical region: Secondary | ICD-10-CM | POA: Diagnosis not present

## 2021-10-28 DIAGNOSIS — M542 Cervicalgia: Secondary | ICD-10-CM | POA: Diagnosis not present

## 2021-10-30 DIAGNOSIS — E119 Type 2 diabetes mellitus without complications: Secondary | ICD-10-CM | POA: Diagnosis not present

## 2021-10-30 DIAGNOSIS — M40292 Other kyphosis, cervical region: Secondary | ICD-10-CM | POA: Diagnosis not present

## 2021-10-30 DIAGNOSIS — M542 Cervicalgia: Secondary | ICD-10-CM | POA: Diagnosis not present

## 2021-10-30 DIAGNOSIS — M9902 Segmental and somatic dysfunction of thoracic region: Secondary | ICD-10-CM | POA: Diagnosis not present

## 2021-10-30 DIAGNOSIS — H43813 Vitreous degeneration, bilateral: Secondary | ICD-10-CM | POA: Diagnosis not present

## 2021-10-30 DIAGNOSIS — M9901 Segmental and somatic dysfunction of cervical region: Secondary | ICD-10-CM | POA: Diagnosis not present

## 2021-10-30 LAB — HM DIABETES EYE EXAM

## 2021-10-31 ENCOUNTER — Encounter: Payer: Self-pay | Admitting: Urology

## 2021-10-31 ENCOUNTER — Ambulatory Visit
Admission: RE | Admit: 2021-10-31 | Discharge: 2021-10-31 | Disposition: A | Discharge status: Home / Self Care | Payer: Medicare Other | Attending: Urology | Admitting: Urology

## 2021-10-31 ENCOUNTER — Ambulatory Visit
Admission: RE | Admit: 2021-10-31 | Discharge: 2021-10-31 | Disposition: A | Discharge status: Home / Self Care | Payer: Medicare Other | Source: Ambulatory Visit | Attending: Urology | Admitting: Urology

## 2021-10-31 ENCOUNTER — Ambulatory Visit: Payer: Medicare Other | Admitting: Urology

## 2021-10-31 ENCOUNTER — Ambulatory Visit: Payer: Self-pay | Admitting: Urology

## 2021-10-31 VITALS — BP 159/90 | HR 86 | Ht 62.0 in | Wt 151.0 lb

## 2021-10-31 DIAGNOSIS — Z87442 Personal history of urinary calculi: Secondary | ICD-10-CM | POA: Insufficient documentation

## 2021-10-31 DIAGNOSIS — M4186 Other forms of scoliosis, lumbar region: Secondary | ICD-10-CM | POA: Diagnosis not present

## 2021-10-31 DIAGNOSIS — N200 Calculus of kidney: Secondary | ICD-10-CM

## 2021-10-31 NOTE — Progress Notes (Signed)
   10/31/2021 3:46 PM   Jerilynn Som 07/29/1950 [id number]  Reason for visit: Follow up nephrolithiasis  HPI: 71 year old female with recurrent calcium oxalate stones.  She underwent left ureteroscopy with Dr. Junious Silk in June 2022, and most recently March 2023 with Dr. Felipa Eth.  Her stent was then removed by Dr. Bernardo Heater a few days later in clinic.  She denies any problems since that time.  I personally viewed and interpreted the CT from March 2023 as well as the KUB today, KUB with single 2 mm left lower pole stone fragment which is stable from prior CT.  We discussed general stone prevention strategies including adequate hydration with goal of producing 2.5 L of urine daily, increasing citric acid intake, increasing calcium intake during high oxalate meals, minimizing animal protein, and decreasing salt intake. Information about dietary recommendations given today.   -I recommended a 90-SXJD urine metabolic work-up with her recurrent stones over the last few years, call with results -RTC 1 year KUB prior  Nickolas Madrid, MD 10/31/2021   [facility] [address] [place], [place] [address] [phone]

## 2021-10-31 NOTE — Addendum Note (Signed)
Addended by: Despina Hidden on: 10/31/2021 03:51 PM   Modules accepted: Orders

## 2021-10-31 NOTE — Patient Instructions (Addendum)
[facility] Instructions [facility]   You will receive a box/kit in the mail that will have a urine jug and instructions in the kit.  When the box arrives you will need to call our office [phone] to schedule a LAB appointment.   You will need to do a 24hour urine and this should be done during the days that our office will be open.  For example any day from Sunday through Thursday.   If you take Vitamin C 100mg or greater please stop this 5 days prior to collection.   How to collect the urine sample: On the day you start the urine sample this 1st morning urine should NOT be collected.  For the rest of the day including all night urines should be collected.  On the next morning the 1st urine should be collected and then you will be finished with the urine collections.   You will need to bring the box with you on your LAB appointment day after urine has been collected and all instructions are complete in the box.  Your blood will be drawn and the box will be collected by our Lab employee to be sent off for analysis.   When urine and blood is complete you will need to schedule a follow up appointment for lab results.    Dietary Guidelines to Help Prevent Kidney Stones Kidney stones are deposits of minerals and salts that form inside your kidneys. Your risk of developing kidney stones may be greater depending on your diet, your lifestyle, the medicines you take, and whether you have certain medical conditions. Most people can lower their chances of developing kidney stones by following the instructions below. Your dietitian may give you more specific instructions depending on your overall health and the type of kidney stones you tend to develop. What are tips for following this plan? Reading food labels  Choose foods with "no salt added" or "low-salt" labels. Limit your salt (sodium) intake to less than 1,500 mg a day. Choose foods with calcium for each meal and snack. Try  to eat about 300 mg of calcium at each meal. Foods that contain 200-500 mg of calcium a serving include: 8 oz (237 mL) of milk, calcium-fortifiednon-dairy milk, and calcium-fortifiedfruit juice. Calcium-fortified means that calcium has been added to these drinks. 8 oz (237 mL) of kefir, yogurt, and soy yogurt. 4 oz (114 g) of tofu. 1 oz (28 g) of cheese. 1 cup (150 g) of dried figs. 1 cup (91 g) of cooked broccoli. One 3 oz (85 g) can of sardines or mackerel. Most people need 1,000-1,500 mg of calcium a day. Talk to your dietitian about how much calcium is recommended for you. Shopping Buy plenty of fresh fruits and vegetables. Most people do not need to avoid fruits and vegetables, even if these foods contain nutrients that may contribute to kidney stones. When shopping for convenience foods, choose: Whole pieces of fruit. Pre-made salads with dressing on the side. Low-fat fruit and yogurt smoothies. Avoid buying frozen meals or prepared deli foods. These can be high in sodium. Look for foods with live cultures, such as yogurt and kefir. Choose high-fiber grains, such as whole-wheat breads, oat bran, and wheat cereals. Cooking Do not add salt to food when cooking. Place a salt shaker on the table and allow each person to add his or her own salt to taste. Use vegetable protein, such as beans, textured vegetable protein (TVP), or tofu, instead of meat in pasta, casseroles,   and soups. Meal planning Eat less salt, if told by your dietitian. To do this: Avoid eating processed or pre-made food. Avoid eating fast food. Eat less animal protein, including cheese, meat, poultry, or fish, if told by your dietitian. To do this: Limit the number of times you have meat, poultry, fish, or cheese each week. Eat a diet free of meat at least 2 days a week. Eat only one serving each day of meat, poultry, fish, or seafood. When you prepare animal protein, cut pieces into small portion sizes. For most meat  and fish, one serving is about the size of the palm of your hand. Eat at least five servings of fresh fruits and vegetables each day. To do this: Keep fruits and vegetables on hand for snacks. Eat one piece of fruit or a handful of berries with breakfast. Have a salad and fruit at lunch. Have two kinds of vegetables at dinner. Limit foods that are high in a substance called oxalate. These include: Spinach (cooked), rhubarb, beets, sweet potatoes, and Swiss chard. Peanuts. Potato chips, french fries, and baked potatoes with skin on. Nuts and nut products. Chocolate. If you regularly take a diuretic medicine, make sure to eat at least 1 or 2 servings of fruits or vegetables that are high in potassium each day. These include: Avocado. Banana. Orange, prune, carrot, or tomato juice. Baked potato. Cabbage. Beans and split peas. Lifestyle  Drink enough fluid to keep your urine pale yellow. This is the most important thing you can do. Spread your fluid intake throughout the day. If you drink alcohol: Limit how much you use to: 0-1 drink a day for women who are not pregnant. 0-2 drinks a day for men. Be aware of how much alcohol is in your drink. In the [place] one drink equals one 12 oz bottle of beer (355 mL), one 5 oz glass of wine (148 mL), or one 1 oz glass of hard liquor (44 mL). Lose weight if told by your health care provider. Work with your dietitian to find an eating plan and weight loss strategies that work best for you. General information Talk to your health care provider and dietitian about taking daily supplements. You may be told the following depending on your health and the cause of your kidney stones: Not to take supplements with vitamin C. To take a calcium supplement. To take a daily probiotic supplement. To take other supplements such as magnesium, fish oil, or vitamin B6. Take over-the-counter and prescription medicines only as told by your health care provider. These  include supplements. What foods should I limit? Limit your intake of the following foods, or eat them as told by your dietitian. Vegetables Spinach. Rhubarb. Beets. Canned vegetables. Pickles. Olives. Baked potatoes with skin. Grains Wheat bran. Baked goods. Salted crackers. Cereals high in sugar. Meats and other proteins Nuts. Nut butters. Large portions of meat, poultry, or fish. Salted, precooked, or cured meats, such as sausages, meat loaves, and hot dogs. Dairy Cheese. Beverages Regular soft drinks. Regular vegetable juice. Seasonings and condiments Seasoning blends with salt. Salad dressings. Soy sauce. Ketchup. Barbecue sauce. Other foods Canned soups. Canned pasta sauce. Casseroles. Pizza. Lasagna. Frozen meals. Potato chips. French fries. The items listed above may not be a complete list of foods and beverages you should limit. Contact a dietitian for more information. What foods should I avoid? Talk to your dietitian about specific foods you should avoid based on the type of kidney stones you have and your overall   health. Fruits Grapefruit. The item listed above may not be a complete list of foods and beverages you should avoid. Contact a dietitian for more information. Summary Kidney stones are deposits of minerals and salts that form inside your kidneys. You can lower your risk of kidney stones by making changes to your diet. The most important thing you can do is drink enough fluid. Drink enough fluid to keep your urine pale yellow. Talk to your dietitian about how much calcium you should have each day, and eat less salt and animal protein as told by your dietitian. This information is not intended to replace advice given to you by your health care provider. Make sure you discuss any questions you have with your health care provider. Document Revised: 12/17/2020 Document Reviewed: 12/17/2020 Elsevier Patient Education  2023 Elsevier Inc.  

## 2021-11-04 DIAGNOSIS — M40292 Other kyphosis, cervical region: Secondary | ICD-10-CM | POA: Diagnosis not present

## 2021-11-04 DIAGNOSIS — H908 Mixed conductive and sensorineural hearing loss, unspecified: Secondary | ICD-10-CM | POA: Diagnosis not present

## 2021-11-04 DIAGNOSIS — M542 Cervicalgia: Secondary | ICD-10-CM | POA: Diagnosis not present

## 2021-11-04 DIAGNOSIS — M9901 Segmental and somatic dysfunction of cervical region: Secondary | ICD-10-CM | POA: Diagnosis not present

## 2021-11-04 DIAGNOSIS — H6981 Other specified disorders of Eustachian tube, right ear: Secondary | ICD-10-CM | POA: Diagnosis not present

## 2021-11-04 DIAGNOSIS — M9902 Segmental and somatic dysfunction of thoracic region: Secondary | ICD-10-CM | POA: Diagnosis not present

## 2021-11-04 DIAGNOSIS — H90A31 Mixed conductive and sensorineural hearing loss, unilateral, right ear with restricted hearing on the contralateral side: Secondary | ICD-10-CM | POA: Diagnosis not present

## 2021-11-04 DIAGNOSIS — H6504 Acute serous otitis media, recurrent, right ear: Secondary | ICD-10-CM | POA: Diagnosis not present

## 2021-11-06 ENCOUNTER — Encounter: Payer: Self-pay | Admitting: Family Medicine

## 2021-11-08 DIAGNOSIS — M9902 Segmental and somatic dysfunction of thoracic region: Secondary | ICD-10-CM | POA: Diagnosis not present

## 2021-11-08 DIAGNOSIS — M40292 Other kyphosis, cervical region: Secondary | ICD-10-CM | POA: Diagnosis not present

## 2021-11-08 DIAGNOSIS — M9901 Segmental and somatic dysfunction of cervical region: Secondary | ICD-10-CM | POA: Diagnosis not present

## 2021-11-08 DIAGNOSIS — M542 Cervicalgia: Secondary | ICD-10-CM | POA: Diagnosis not present

## 2021-11-11 DIAGNOSIS — M542 Cervicalgia: Secondary | ICD-10-CM | POA: Diagnosis not present

## 2021-11-11 DIAGNOSIS — M9901 Segmental and somatic dysfunction of cervical region: Secondary | ICD-10-CM | POA: Diagnosis not present

## 2021-11-11 DIAGNOSIS — M9902 Segmental and somatic dysfunction of thoracic region: Secondary | ICD-10-CM | POA: Diagnosis not present

## 2021-11-11 DIAGNOSIS — M40292 Other kyphosis, cervical region: Secondary | ICD-10-CM | POA: Diagnosis not present

## 2021-11-15 DIAGNOSIS — M9902 Segmental and somatic dysfunction of thoracic region: Secondary | ICD-10-CM | POA: Diagnosis not present

## 2021-11-15 DIAGNOSIS — M542 Cervicalgia: Secondary | ICD-10-CM | POA: Diagnosis not present

## 2021-11-15 DIAGNOSIS — M9901 Segmental and somatic dysfunction of cervical region: Secondary | ICD-10-CM | POA: Diagnosis not present

## 2021-11-15 DIAGNOSIS — M40292 Other kyphosis, cervical region: Secondary | ICD-10-CM | POA: Diagnosis not present

## 2021-11-19 DIAGNOSIS — M542 Cervicalgia: Secondary | ICD-10-CM | POA: Diagnosis not present

## 2021-11-19 DIAGNOSIS — M40292 Other kyphosis, cervical region: Secondary | ICD-10-CM | POA: Diagnosis not present

## 2021-11-19 DIAGNOSIS — M9901 Segmental and somatic dysfunction of cervical region: Secondary | ICD-10-CM | POA: Diagnosis not present

## 2021-11-19 DIAGNOSIS — M9902 Segmental and somatic dysfunction of thoracic region: Secondary | ICD-10-CM | POA: Diagnosis not present

## 2021-11-25 DIAGNOSIS — H6981 Other specified disorders of Eustachian tube, right ear: Secondary | ICD-10-CM | POA: Diagnosis not present

## 2021-11-27 DIAGNOSIS — M9902 Segmental and somatic dysfunction of thoracic region: Secondary | ICD-10-CM | POA: Diagnosis not present

## 2021-11-27 DIAGNOSIS — M40292 Other kyphosis, cervical region: Secondary | ICD-10-CM | POA: Diagnosis not present

## 2021-11-27 DIAGNOSIS — M542 Cervicalgia: Secondary | ICD-10-CM | POA: Diagnosis not present

## 2021-11-27 DIAGNOSIS — M9901 Segmental and somatic dysfunction of cervical region: Secondary | ICD-10-CM | POA: Diagnosis not present

## 2021-12-10 DIAGNOSIS — M9902 Segmental and somatic dysfunction of thoracic region: Secondary | ICD-10-CM | POA: Diagnosis not present

## 2021-12-10 DIAGNOSIS — M9901 Segmental and somatic dysfunction of cervical region: Secondary | ICD-10-CM | POA: Diagnosis not present

## 2021-12-10 DIAGNOSIS — M542 Cervicalgia: Secondary | ICD-10-CM | POA: Diagnosis not present

## 2021-12-10 DIAGNOSIS — M40292 Other kyphosis, cervical region: Secondary | ICD-10-CM | POA: Diagnosis not present

## 2022-01-07 DIAGNOSIS — M9901 Segmental and somatic dysfunction of cervical region: Secondary | ICD-10-CM | POA: Diagnosis not present

## 2022-01-07 DIAGNOSIS — M542 Cervicalgia: Secondary | ICD-10-CM | POA: Diagnosis not present

## 2022-01-07 DIAGNOSIS — M9902 Segmental and somatic dysfunction of thoracic region: Secondary | ICD-10-CM | POA: Diagnosis not present

## 2022-01-07 DIAGNOSIS — M40292 Other kyphosis, cervical region: Secondary | ICD-10-CM | POA: Diagnosis not present

## 2022-01-24 ENCOUNTER — Other Ambulatory Visit: Payer: Self-pay | Admitting: *Deleted

## 2022-01-24 ENCOUNTER — Encounter: Payer: Self-pay | Admitting: *Deleted

## 2022-01-24 DIAGNOSIS — N200 Calculus of kidney: Secondary | ICD-10-CM

## 2022-02-05 DIAGNOSIS — M9902 Segmental and somatic dysfunction of thoracic region: Secondary | ICD-10-CM | POA: Diagnosis not present

## 2022-02-05 DIAGNOSIS — M9901 Segmental and somatic dysfunction of cervical region: Secondary | ICD-10-CM | POA: Diagnosis not present

## 2022-02-05 DIAGNOSIS — M40292 Other kyphosis, cervical region: Secondary | ICD-10-CM | POA: Diagnosis not present

## 2022-02-05 DIAGNOSIS — M542 Cervicalgia: Secondary | ICD-10-CM | POA: Diagnosis not present

## 2022-02-17 ENCOUNTER — Other Ambulatory Visit: Payer: Self-pay | Admitting: Urology

## 2022-02-17 ENCOUNTER — Other Ambulatory Visit: Payer: Medicare Other

## 2022-02-17 DIAGNOSIS — N200 Calculus of kidney: Secondary | ICD-10-CM

## 2022-02-18 LAB — LITHOLINK SERUM PANEL
CO2: 20 mmol/L (ref 20–29)
Calcium: 9.5 mg/dL (ref 8.7–10.3)
Chloride: 100 mmol/L (ref 96–106)
Creatinine, Ser: 0.91 mg/dL (ref 0.57–1.00)
Magnesium: 1.6 mg/dL (ref 1.6–2.3)
Phosphorus: 3.7 mg/dL (ref 3.0–4.3)
Potassium: 4.7 mmol/L (ref 3.5–5.2)
Sodium: 139 mmol/L (ref 134–144)
Uric Acid: 3.4 mg/dL (ref 3.0–7.2)
eGFR: 68 mL/min/{1.73_m2} (ref >59)

## 2022-02-19 ENCOUNTER — Other Ambulatory Visit: Payer: Self-pay

## 2022-02-19 ENCOUNTER — Other Ambulatory Visit: Payer: Medicare Other

## 2022-02-19 DIAGNOSIS — E785 Hyperlipidemia, unspecified: Secondary | ICD-10-CM | POA: Diagnosis not present

## 2022-02-19 DIAGNOSIS — Z0000 Encounter for general adult medical examination without abnormal findings: Secondary | ICD-10-CM | POA: Diagnosis not present

## 2022-02-19 DIAGNOSIS — E1169 Type 2 diabetes mellitus with other specified complication: Secondary | ICD-10-CM | POA: Diagnosis not present

## 2022-02-19 DIAGNOSIS — I10 Essential (primary) hypertension: Secondary | ICD-10-CM

## 2022-02-20 LAB — CBC WITH DIFFERENTIAL/PLATELET
Absolute Monocytes: 462 cells/uL (ref 200–950)
Basophils Absolute: 59 cells/uL (ref 0–200)
Basophils Relative: 0.9 %
Eosinophils Absolute: 247 cells/uL (ref 15–500)
Eosinophils Relative: 3.8 %
HCT: 35 % (ref 35.0–45.0)
Hemoglobin: 12 g/dL (ref 11.7–15.5)
Lymphs Abs: 1268 cells/uL (ref 850–3900)
MCH: 30.8 pg (ref 27.0–33.0)
MCHC: 34.3 g/dL (ref 32.0–36.0)
MCV: 89.7 fL (ref 80.0–100.0)
MPV: 9.4 fL (ref 7.5–12.5)
Monocytes Relative: 7.1 %
Neutro Abs: 4466 cells/uL (ref 1500–7800)
Neutrophils Relative %: 68.7 %
Platelets: 290 10*3/uL (ref 140–400)
RBC: 3.9 10*6/uL (ref 3.80–5.10)
RDW: 12.7 % (ref 11.0–15.0)
Total Lymphocyte: 19.5 %
WBC: 6.5 10*3/uL (ref 3.8–10.8)

## 2022-02-20 LAB — COMPLETE METABOLIC PANEL WITH GFR
AG Ratio: 1.6 (calc) (ref 1.0–2.5)
ALT: 8 U/L (ref 6–29)
AST: 19 U/L (ref 10–35)
Albumin: 4.1 g/dL (ref 3.6–5.1)
Alkaline phosphatase (APISO): 109 U/L (ref 37–153)
BUN: 13 mg/dL (ref 7–25)
CO2: 22 mmol/L (ref 20–32)
Calcium: 9.3 mg/dL (ref 8.6–10.4)
Chloride: 104 mmol/L (ref 98–110)
Creat: 0.84 mg/dL (ref 0.60–1.00)
Globulin: 2.6 g/dL (calc) (ref 1.9–3.7)
Glucose, Bld: 156 mg/dL — ABNORMAL HIGH (ref 65–99)
Potassium: 4.3 mmol/L (ref 3.5–5.3)
Sodium: 136 mmol/L (ref 135–146)
Total Bilirubin: 0.6 mg/dL (ref 0.2–1.2)
Total Protein: 6.7 g/dL (ref 6.1–8.1)
eGFR: 75 mL/min/{1.73_m2} (ref >=60)

## 2022-02-20 LAB — LIPID PANEL
Cholesterol: 115 mg/dL (ref <200)
HDL: 48 mg/dL — ABNORMAL LOW (ref >=50)
LDL Cholesterol (Calc): 44 mg/dL (calc)
Non-HDL Cholesterol (Calc): 67 mg/dL (calc) (ref <130)
Total CHOL/HDL Ratio: 2.4 (calc) (ref <5.0)
Triglycerides: 147 mg/dL (ref <150)

## 2022-02-20 LAB — TSH: TSH: 2.65 mIU/L (ref 0.40–4.50)

## 2022-02-20 LAB — HEMOGLOBIN A1C
Hgb A1c MFr Bld: 7.1 % of total Hgb — ABNORMAL HIGH (ref <5.7)
Mean Plasma Glucose: 157 mg/dL
eAG (mmol/L): 8.7 mmol/L

## 2022-02-22 LAB — LITHOLINK 24HR URINE PANEL
Ammonium, Urine: 15 mmol/24 hr (ref 15–60)
Calcium Oxalate Saturation: 5.65 — ABNORMAL LOW (ref 6.00–10.00)
Calcium Phosphate Saturation: 0.11 — ABNORMAL LOW (ref 0.50–2.00)
Calcium, Urine: 60 mg/24 hr (ref <200)
Calcium/Creatinine Ratio: 79 mg/g creat (ref 51–262)
Calcium/Kg Body Weight: 0.9 mg/24 hr/kg (ref <4.0)
Chloride, Urine: 77 mmol/24 hr (ref 70–250)
Citrate, Urine: 792 mg/24 hr (ref >550)
Creatinine, Urine: 758 mg/24 hr
Creatinine/Kg Body Weight: 11 mg/24 hr/kg (ref 8.7–20.3)
Cystine, Urine, Qualitative: NEGATIVE
Magnesium, Urine: 40 mg/24 hr (ref 30–120)
Oxalate, Urine: 23 mg/24 hr (ref 20–40)
Phosphorus, Urine: 461 mg/24 hr — ABNORMAL LOW (ref 600–1200)
Potassium, Urine: 34 mmol/24 hr (ref 20–100)
Protein Catabolic Rate: 0.6 g/kg/24 hr — ABNORMAL LOW (ref 0.8–1.4)
Sodium, Urine: 82 mmol/24 hr (ref 50–150)
Sulfate, Urine: 14 meq/24 hr — ABNORMAL LOW (ref 20–80)
Urea Nitrogen, Urine: 4.15 g/24 hr — ABNORMAL LOW (ref 6.00–14.00)
Uric Acid Saturation: 3.14 — ABNORMAL HIGH (ref <1.00)
Uric Acid, Urine: 447 mg/24 hr (ref <750)
Urine Volume (Preserved): 920 mL/24 hr (ref 500–4000)
pH, 24 hr, Urine: 5.202 — ABNORMAL LOW (ref 5.800–6.200)

## 2022-02-25 DIAGNOSIS — H6122 Impacted cerumen, left ear: Secondary | ICD-10-CM | POA: Diagnosis not present

## 2022-02-25 DIAGNOSIS — H6983 Other specified disorders of Eustachian tube, bilateral: Secondary | ICD-10-CM | POA: Diagnosis not present

## 2022-02-26 ENCOUNTER — Ambulatory Visit (INDEPENDENT_AMBULATORY_CARE_PROVIDER_SITE_OTHER): Payer: Medicare Other | Admitting: Family Medicine

## 2022-02-26 ENCOUNTER — Encounter: Payer: Self-pay | Admitting: Family Medicine

## 2022-02-26 VITALS — BP 143/50 | HR 78 | Ht 62.0 in | Wt 151.4 lb

## 2022-02-26 DIAGNOSIS — I10 Essential (primary) hypertension: Secondary | ICD-10-CM

## 2022-02-26 DIAGNOSIS — Z1231 Encounter for screening mammogram for malignant neoplasm of breast: Secondary | ICD-10-CM | POA: Diagnosis not present

## 2022-02-26 DIAGNOSIS — Z23 Encounter for immunization: Secondary | ICD-10-CM | POA: Diagnosis not present

## 2022-02-26 DIAGNOSIS — Z0000 Encounter for general adult medical examination without abnormal findings: Secondary | ICD-10-CM

## 2022-02-26 DIAGNOSIS — E785 Hyperlipidemia, unspecified: Secondary | ICD-10-CM

## 2022-02-26 DIAGNOSIS — E1169 Type 2 diabetes mellitus with other specified complication: Secondary | ICD-10-CM

## 2022-02-26 NOTE — Assessment & Plan Note (Signed)
Controlled Home readings normal No known complications  Failed: Lisinopril (ACEi cough)    Plan:  1. Continue current BP regimen Losartan '50mg'$  daily 2. Encourage improved lifestyle - low sodium diet, regular exercise 3. Continue monitor BP outside office, bring readings to next visit, if persistently >140/90 or new symptoms notify office sooner

## 2022-02-26 NOTE — Assessment & Plan Note (Signed)
Controlled lipids on statin and lifestyle Fam history HyperTG    Plan: 1. Continue Rosuvastatin '10mg'$  nightly 2. Continue ASA '81mg'$  for primary ASCVD risk reduction 3. Encourage improved lifestyle - low carb/cholesterol, reduce portion size, continue improving regular exercise

## 2022-02-26 NOTE — Assessment & Plan Note (Signed)
Slight inc A1c to 7.1 No hypoglycemia Complications - other including hyperlipidemia, obesity - increases risk of future cardiovascular complications   Plan:  1. Continue Metformin IR '500mg'$  BID 2. Encourage improved lifestyle - low carb, low sugar diet, reduce portion size, continue improving regular exercise 3. Check CBG, bring log to next visit for review 4. Continue ASA, ARB, Statin

## 2022-02-26 NOTE — Patient Instructions (Addendum)
Thank you for coming to the office today.  Primary risk factor on 24-hour urine result is a low urine volume.  Goal should be 2.5 L of urine output a day, and urine should be clear to very faint yellow.  Best fluids are lemonade or adding lemon juice to the water, avoid dark sodas and tea   Shingles vaccine and Flu Shot today  Try topical Cortisone for the skin spot. I am not worried about it.  Please schedule a Follow-up Appointment to: Return in about 6 months (around 08/27/2022) for 6 month DM A1c, follow-up.  If you have any other questions or concerns, please feel free to call the office or send a message through [place]. You may also schedule an earlier appointment if necessary.  Additionally, you may be receiving a survey about your experience at our office within a few days to 1 week by e-mail or mail. We value your feedback.  [name], DO [place]

## 2022-02-26 NOTE — Progress Notes (Signed)
Subjective:    Patient ID: Andrea Dodson, female    DOB: Apr 12, 1951, 71 y.o.   MRN: [id number]  Andrea Dodson is a 71 y.o. female presenting on 02/26/2022 for Annual Exam   HPI  Here for Annual Physical and Lab Review.     CHRONIC DM, Type 2 / Overweight BMI >28 Hyperlipidemia   Last A1c 7.1, previously 6.9, prior range similar. CBGs: stable readings Meds: Metformin 546m BID Tolerating well w/o side-effects Currently ARB / ASA daily, on Statin - Rosuvastatin 127mLifestyle: - Weight stable - Diet improved DM diet. Largest mea of day is afternoon/lunch, Occasional sweets - Exercise (walking now goal to resume exercise) - Request copy of Diabetic Eye Exam 10/2021 [facility],  no Diabetic Retinopathy. Denies hypoglycemia   CHRONIC HTN: Reports no new concerns. Home BP normal Current Meds - Losartan 5062maily - Prior history w/ ACEi cough on Lisinopril Reports good compliance, took meds today. Tolerating well, w/o complaints.   Low Hemoglobin / Anemia Hemoglobin improved >12 now Asymptomatic, no bleeding or other symptoms Taking Aspirin 81    PMH - Seasonal allergies - taking Loratadine 52m1mily, controls her allergy symptoms   Left Ear cerumen impacted Working with ENT, still not resolved    Health Maintenance:      Last cologuard 02/15/20 negative. Repeat due 01/2023   Due for Flu Shot, will receive today     Mammogram UTD 09/2019 q every other year    UTD Pneumonia vaccine series. Prevnar-13 and Pneumovax-23 previously ages 65 a[age]. 44ue for Shingrix #2 dose     02/26/2022   10:54 AM 08/14/2021   10:36 AM 02/26/2021    9:30 AM  Depression screen PHQ 2/9  Decreased Interest 0 0 0  Down, Depressed, Hopeless 0 0 0  PHQ - 2 Score 0 0 0  Altered sleeping 0 0   Tired, decreased energy 0 0   Change in appetite 0 0   Feeling bad or failure about yourself  0 0   Trouble concentrating 0 0   Moving slowly or fidgety/restless 0 0   Suicidal thoughts 0  0   PHQ-9 Score 0 0   Difficult doing work/chores Not difficult at all Not difficult at all     Past Medical History:  Diagnosis Date   Allergy    Arthritis    right knee   Diabetes mellitus, type 2 (HCC)[place] Hypertension    Kidney stone    Urinary incontinence    Wears dentures    full upper and lower   Past Surgical History:  Procedure Laterality Date   APPENDECTOMY     CATARACT EXTRACTION W/PHACO Left 08/08/2019   Procedure: CATARACT EXTRACTION PHACO AND INTRAOCULAR LENS PLACEMENT (IOC) LEFT 4.59  00:32.3;  Surgeon: Andrea Dodson;  [facility];  Laterality: Left;  Diabetic - oral meds   CATARACT EXTRACTION W/PHACO Right 09/05/2019   Procedure: CATARACT EXTRACTION PHACO AND INTRAOCULAR LENS PLACEMENT (IOC) RIGHT DIABETIC 4.65  00:34.2;  Surgeon: Andrea Dodson;  [facility];  Laterality: Right;  Diabetic - oral meds   CHOLECYSTECTOMY  1993   CYSTOSCOPY W/ RETROGRADES Bilateral 07/14/2021   Procedure: CYSTOSCOPY WITH RETROGRADE PYELOGRAM;  Surgeon: Andrea Dodson;  Location: [facility];  Service: Urology;  Laterality: Bilateral;   CYSTOSCOPY/URETEROSCOPY/HOLMIUM LASER/STENT PLACEMENT Left 09/22/2020   Procedure: CYSTOSCOPY/URETEROSCOPY/HOLMIUM LASER/STENT PLACEMENT;  Surgeon: Andrea Dodson;  Location:  [facility];  Service: Urology;  Laterality: Left;   TONSILLECTOMY     URETEROSCOPY WITH HOLMIUM LASER LITHOTRIPSY Left 07/14/2021   Procedure: URETEROSCOPY WITH LEFT STENT PLACEMENT;  Surgeon: Andrea Dodson;  Location: [facility];  Service: Urology;  Laterality: Left;   Social History   Socioeconomic History   Marital status: Widowed    Spouse name: Not on file   Number of children: Not on file   Years of education: Not on file   Highest education level: Some college, no degree  Occupational History   Occupation: retired  Tobacco Use   Smoking status:  Never    Passive exposure: Never   Smokeless tobacco: Never  Vaping Use   Vaping Use: Never used  Substance and Sexual Activity   Alcohol use: No   Drug use: No   Sexual activity: Not Currently  Other Topics Concern   Not on file  Social History Narrative   Not on file   Social Determinants of Health   Financial Resource Strain: [place]  (02/26/2021)   Overall Financial Resource Strain (CARDIA)    Difficulty of Paying Living Expenses: Not hard at all  Food Insecurity: No Food Insecurity (02/26/2021)   Hunger Vital Sign    Worried About Running Out of Food in the Last Year: Never true    [place] in the Last Year: Never true  Transportation Needs: No Transportation Needs (02/26/2021)   PRAPARE - Hydrologist (Medical): No    Lack of Transportation (Non-Medical): No  Physical Activity: Inactive (02/26/2021)   Exercise Vital Sign    Days of Exercise per Week: 0 days    Minutes of Exercise per Session: 0 min  Stress: No Stress Concern Present (02/26/2021)   [place]    Feeling of Stress : Not at all  Social Connections: Moderately Isolated (02/26/2021)   Social Connection and Isolation Panel [NHANES]    Frequency of Communication with Friends and Family: More than three times a week    Frequency of Social Gatherings with Friends and Family: More than three times a week    Attends Religious Services: Never    Marine scientist or Organizations: Yes    Attends Archivist Meetings: 1 to 4 times per year    Marital Status: Widowed  Intimate Partner Violence: Not At Risk (02/26/2021)   Humiliation, Afraid, Rape, and Kick questionnaire    Fear of Current or Ex-Partner: No    Emotionally Abused: No    Physically Abused: No    Sexually Abused: No   Family History  Problem Relation Age of Onset   Heart failure Mother    Heart disease Mother    Diabetes Mother     Heart attack Mother    Cancer Father        lung   Heart attack Sister    Diabetes Sister    Breast cancer Half-Sister    Current Outpatient Medications on File Prior to Visit  Medication Sig   aspirin EC 81 MG tablet Take 1 tablet (81 mg total) by mouth daily.   Blood Glucose Monitoring Suppl (ONE TOUCH ULTRA 2) w/Device KIT 1 device for checking blood sugar once daily   fluticasone (FLONASE) 50 MCG/ACT nasal spray Place 2 sprays into both nostrils daily. Use for 4-6 weeks then stop and use seasonally or as needed.   glucose blood (ONE TOUCH ULTRA  TEST) test strip CHECK BLOOD SUGAR ONCE DAILY   Lancets (ONETOUCH ULTRASOFT) lancets Use as instructed   loratadine (CLARITIN) 10 MG tablet Take 1 tablet (10 mg total) by mouth daily.   losartan (COZAAR) 50 MG tablet Take 1 tablet (50 mg total) by mouth daily.   metFORMIN (GLUCOPHAGE) 500 MG tablet Take 1 tablet (500 mg total) by mouth 2 (two) times daily with a meal.   naproxen (NAPROSYN) 500 MG tablet Take 1 tablet (500 mg total) by mouth 2 (two) times daily with a meal.   phenazopyridine (PYRIDIUM) 200 MG tablet Take 1 tablet (200 mg total) by mouth 3 (three) times daily as needed for pain.   rosuvastatin (CRESTOR) 10 MG tablet Take 1 tablet (10 mg total) by mouth daily.   No current facility-administered medications on file prior to visit.    Review of Systems  Constitutional:  Negative for activity change, appetite change, chills, diaphoresis, fatigue and fever.  HENT:  Negative for congestion and hearing loss.   Eyes:  Negative for visual disturbance.  Respiratory:  Negative for cough, chest tightness, shortness of breath and wheezing.   Cardiovascular:  Negative for chest pain, palpitations and leg swelling.  Gastrointestinal:  Negative for abdominal pain, constipation, diarrhea, nausea and vomiting.  Genitourinary:  Negative for dysuria, frequency and hematuria.  Musculoskeletal:  Negative for arthralgias and neck pain.  Skin:   Negative for rash.  Neurological:  Negative for dizziness, weakness, light-headedness, numbness and headaches.  Hematological:  Negative for adenopathy.  Psychiatric/Behavioral:  Negative for behavioral problems, dysphoric mood and sleep disturbance.    Per HPI unless specifically indicated above      Objective:    BP (!) 143/50   Pulse 78   Ht _0  (1.575 m)   Wt 151 lb 6.4 oz (68.7 kg)   SpO2 100%   BMI 27.69 kg/m   Wt Readings from Last 3 Encounters:  02/26/22 151 lb 6.4 oz (68.7 kg)  10/31/21 151 lb (68.5 kg)  09/27/21 152 lb 3.2 oz (69 kg)    Physical Exam Vitals and nursing note reviewed.  Constitutional:      General: She is not in acute distress.    Appearance: She is well-developed. She is not diaphoretic.     Comments: Well-appearing, comfortable, cooperative  HENT:     Head: Normocephalic and atraumatic.  Eyes:     General:        Right eye: No discharge.        Left eye: No discharge.     Conjunctiva/sclera: Conjunctivae normal.     Pupils: Pupils are equal, round, and reactive to light.  Neck:     Thyroid: No thyromegaly.  Cardiovascular:     Rate and Rhythm: Normal rate and regular rhythm.     Pulses: Normal pulses.     Heart sounds: Normal heart sounds. No murmur heard. Pulmonary:     Effort: Pulmonary effort is normal. No respiratory distress.     Breath sounds: Normal breath sounds. No wheezing or rales.  Abdominal:     General: Bowel sounds are normal. There is no distension.     Palpations: Abdomen is soft. There is no mass.     Tenderness: There is no abdominal tenderness.  Musculoskeletal:        General: No tenderness. Normal range of motion.     Cervical back: Normal range of motion and neck supple.     Comments: Upper / Lower Extremities: - Normal muscle tone,  strength bilateral upper extremities 5/5, lower extremities 5/5  Lymphadenopathy:     Cervical: No cervical adenopathy.  Skin:    General: Skin is warm and dry.     Findings:  Lesion (left upper arm anterior 1 x 1 cm dry scaley macule lesion with non blanching darker red appearance of capiliary lesion no ulceration) present. No erythema or rash.  Neurological:     Mental Status: She is alert and oriented to person, place, and time.     Comments: Distal sensation intact to light touch all extremities  Psychiatric:        Mood and Affect: Mood normal.        Behavior: Behavior normal.        Thought Content: Thought content normal.     Comments: Well groomed, good eye contact, normal speech and thoughts    Diabetic Foot Exam - Simple   Simple Foot Form Diabetic Foot exam was performed with the following findings: Yes 02/26/2022  1:02 PM  Visual Inspection No deformities, no ulcerations, no other skin breakdown bilaterally: Yes Sensation Testing Intact to touch and monofilament testing bilaterally: Yes Pulse Check Posterior Tibialis and Dorsalis pulse intact bilaterally: Yes Comments      Results for orders placed or performed in visit on 02/19/22  TSH  Result Value Ref Range   TSH 2.65 0.40 - 4.50 mIU/L  Hemoglobin A1c  Result Value Ref Range   Hgb A1c MFr Bld 7.1 (H) <5.7 % of total Hgb   Mean Plasma Glucose 157 mg/dL   eAG (mmol/L) 8.7 mmol/L  Lipid panel  Result Value Ref Range   Cholesterol 115 <200 mg/dL   HDL 48 (L) > OR = 50 mg/dL   Triglycerides 147 <150 mg/dL   LDL Cholesterol (Calc) 44 mg/dL (calc)   Total CHOL/HDL Ratio 2.4 <5.0 (calc)   Non-HDL Cholesterol (Calc) 67 <130 mg/dL (calc)  CBC with Differential/Platelet  Result Value Ref Range   WBC 6.5 3.8 - 10.8 Thousand/uL   RBC 3.90 3.80 - 5.10 Million/uL   Hemoglobin 12.0 11.7 - 15.5 g/dL   HCT 35.0 35.0 - 45.0 %   MCV 89.7 80.0 - 100.0 fL   MCH 30.8 27.0 - 33.0 pg   MCHC 34.3 32.0 - 36.0 g/dL   RDW 12.7 11.0 - 15.0 %   Platelets 290 140 - 400 Thousand/uL   MPV 9.4 7.5 - 12.5 fL   Neutro Abs 4,466 1,500 - 7,800 cells/uL   Lymphs Abs 1,268 850 - 3,900 cells/uL   Absolute  Monocytes 462 200 - 950 cells/uL   Eosinophils Absolute 247 15 - 500 cells/uL   Basophils Absolute 59 0 - 200 cells/uL   Neutrophils Relative % 68.7 %   Total Lymphocyte 19.5 %   Monocytes Relative 7.1 %   Eosinophils Relative 3.8 %   Basophils Relative 0.9 %  COMPLETE METABOLIC PANEL WITH GFR  Result Value Ref Range   Glucose, Bld 156 (H) 65 - 99 mg/dL   BUN 13 7 - 25 mg/dL   Creat 0.84 0.60 - 1.00 mg/dL   eGFR 75 > OR = 60 mL/min/1.45m   BUN/Creatinine Ratio SEE NOTE: 6 - 22 (calc)   Sodium 136 135 - 146 mmol/L   Potassium 4.3 3.5 - 5.3 mmol/L   Chloride 104 98 - 110 mmol/L   CO2 22 20 - 32 mmol/L   Calcium 9.3 8.6 - 10.4 mg/dL   Total Protein 6.7 6.1 - 8.1 g/dL   Albumin 4.1  3.6 - 5.1 g/dL   Globulin 2.6 1.9 - 3.7 g/dL (calc)   AG Ratio 1.6 1.0 - 2.5 (calc)   Total Bilirubin 0.6 0.2 - 1.2 mg/dL   Alkaline phosphatase (APISO) 109 37 - 153 U/L   AST 19 10 - 35 U/L   ALT 8 6 - 29 U/L      Assessment & Plan:   Problem List Items Addressed This Visit     Essential hypertension    Controlled Home readings normal No known complications  Failed: Lisinopril (ACEi cough)    Plan:  1. Continue current BP regimen Losartan 18m daily 2. Encourage improved lifestyle - low sodium diet, regular exercise 3. Continue monitor BP outside office, bring readings to next visit, if persistently >140/90 or new symptoms notify office sooner      Hyperlipidemia associated with type 2 diabetes mellitus ([place]    Controlled lipids on statin and lifestyle Fam history HyperTG    Plan: 1. Continue Rosuvastatin 174mnightly 2. Continue ASA 8126mor primary ASCVD risk reduction 3. Encourage improved lifestyle - low carb/cholesterol, reduce portion size, continue improving regular exercise      Type 2 diabetes mellitus with other specified complication (HCC)    Slight inc A1c to 7.1 No hypoglycemia Complications - other including hyperlipidemia, obesity - increases risk of future  cardiovascular complications   Plan:  1. Continue Metformin IR 500m75mD 2. Encourage improved lifestyle - low carb, low sugar diet, reduce portion size, continue improving regular exercise 3. Check CBG, bring log to next visit for review 4. Continue ASA, ARB, Statin       Other Visit Diagnoses     Annual physical exam    -  Primary   Needs flu shot       Relevant Orders   Flu Vaccine QUAD High Dose(Fluad) (Completed)   Need for shingles vaccine       Relevant Orders   Varicella-zoster vaccine IM (Completed)   Encounter for screening mammogram for malignant neoplasm of breast       Relevant Orders   MM 3D SCREEN BREAST BILATERAL       Updated Health Maintenance information Reviewed recent lab results with patient Encouraged improvement to lifestyle with diet and exercise Goal of weight loss  Skin lesion on L arm, reassuring, appears benign. No evidence of non healing ulcer or other red flag. Follow up.  Orders Placed This Encounter  Procedures   MM 3D SCREEN BREAST BILATERAL    Standing Status:   Future    Standing Expiration Date:   02/27/2023    Order Specific Question:   Reason for Exam (SYMPTOM  OR DIAGNOSIS REQUIRED)    Answer:   Screening bilateral 3D Mammogram Tomo    Order Specific Question:   Preferred imaging location?    Answer:   [facility]   Varicella-zoster vaccine IM   Flu Vaccine QUAD High Dose(Fluad)     No orders of the defined types were placed in this encounter.     Follow up plan: Return in about 6 months (around 08/27/2022) for 6 month DM A1c, follow-up.  AlexNobie Putnam [facility] 02/26/2022, 11:05 AM

## 2022-03-04 DIAGNOSIS — M9902 Segmental and somatic dysfunction of thoracic region: Secondary | ICD-10-CM | POA: Diagnosis not present

## 2022-03-04 DIAGNOSIS — M40292 Other kyphosis, cervical region: Secondary | ICD-10-CM | POA: Diagnosis not present

## 2022-03-04 DIAGNOSIS — M9901 Segmental and somatic dysfunction of cervical region: Secondary | ICD-10-CM | POA: Diagnosis not present

## 2022-03-04 DIAGNOSIS — M542 Cervicalgia: Secondary | ICD-10-CM | POA: Diagnosis not present

## 2022-03-11 DIAGNOSIS — H6983 Other specified disorders of Eustachian tube, bilateral: Secondary | ICD-10-CM | POA: Diagnosis not present

## 2022-03-11 DIAGNOSIS — H6122 Impacted cerumen, left ear: Secondary | ICD-10-CM | POA: Diagnosis not present

## 2022-04-21 ENCOUNTER — Other Ambulatory Visit: Payer: Self-pay | Admitting: Family Medicine

## 2022-04-23 NOTE — Telephone Encounter (Signed)
Requested Prescriptions  Pending Prescriptions Disp Refills   metFORMIN (GLUCOPHAGE) 500 MG tablet [Pharmacy Med Name: metFORMIN HCl 500 MG Oral Tablet] 200 tablet 0    Sig: TAKE 1 TABLET BY MOUTH TWICE  DAILY WITH MEALS     Endocrinology:  Diabetes - Biguanides Failed - 04/21/2022 10:57 PM      Failed - B12 Level in normal range and within 720 days    No results found for: "VITAMINB12"       Passed - Cr in normal range and within 360 days    Creat  Date Value Ref Range Status  02/19/2022 0.84 0.60 - 1.00 mg/dL Final   Creatinine, Urine  Date Value Ref Range Status  08/14/2021 98 20 - 275 mg/dL Final         Passed - HBA1C is between 0 and 7.9 and within 180 days    Hemoglobin A1C  Date Value Ref Range Status  08/06/2021 6.9  Final   Hgb A1c MFr Bld  Date Value Ref Range Status  02/19/2022 7.1 (H) <5.7 % of total Hgb Final    Comment:    For someone without known diabetes, a hemoglobin A1c value of 6.5% or greater indicates that they may have  diabetes and this should be confirmed with a follow-up  test. . For someone with known diabetes, a value <7% indicates  that their diabetes is well controlled and a value  greater than or equal to 7% indicates suboptimal  control. A1c targets should be individualized based on  duration of diabetes, age, comorbid conditions, and  other considerations. . Currently, no consensus exists regarding use of hemoglobin A1c for diagnosis of diabetes for children. .          Passed - eGFR in normal range and within 360 days    GFR, Est African American  Date Value Ref Range Status  01/24/2020 84 > OR = 60 mL/min/1.71m Final   GFR, Est Non African American  Date Value Ref Range Status  01/24/2020 72 > OR = 60 mL/min/1.722mFinal   GFR, Estimated  Date Value Ref Range Status  07/14/2021 >60 >60 mL/min Final    Comment:    (NOTE) Calculated using the CKD-EPI Creatinine Equation (2021)    eGFR  Date Value Ref Range Status   02/19/2022 75 > OR = 60 mL/min/1.7358minal  02/17/2022 68 >59 mL/min/1.73 Final         Passed - Valid encounter within last 6 months    Recent Outpatient Visits           1 month ago Annual physical exam   [place]   6 months ago Dysfunction of both eustachian tubes   [place]   8 months ago Type 2 diabetes mellitus with other specified complication, without long-term current use of insulin ([facility]   1 year ago Encounter for MedCom[employer] Companynual wellness exam   [place]   1 year ago Annual physical exam   [place]       Future Appointments             In 4 months [facility] [facility] normal limits and completed in  the last 12 months    WBC  Date Value Ref Range Status  02/19/2022 6.5 3.8 - 10.8 Thousand/uL Final   RBC  Date Value Ref Range Status  02/19/2022 3.90 3.80 - 5.10 Million/uL Final   Hemoglobin  Date Value Ref Range Status  02/19/2022 12.0 11.7 - 15.5 g/dL Final   HCT  Date Value Ref Range Status  02/19/2022 35.0 35.0 - 45.0 % Final   MCHC  Date Value Ref Range Status  02/19/2022 34.3 32.0 - 36.0 g/dL Final   [facility]  Date Value Ref Range Status  02/19/2022 30.8 27.0 - 33.0 pg Final   MCV  Date Value Ref Range Status  02/19/2022 89.7 80.0 - 100.0 fL Final   No results found for: "PLTCOUNTKUC", "LABPLAT", "POCPLA" RDW  Date Value Ref Range Status  02/19/2022 12.7 11.0 - 15.0 % Final

## 2022-05-24 ENCOUNTER — Other Ambulatory Visit: Payer: Self-pay | Admitting: Family Medicine

## 2022-05-24 DIAGNOSIS — E1169 Type 2 diabetes mellitus with other specified complication: Secondary | ICD-10-CM

## 2022-05-25 ENCOUNTER — Other Ambulatory Visit: Payer: Self-pay | Admitting: Family Medicine

## 2022-05-25 DIAGNOSIS — I10 Essential (primary) hypertension: Secondary | ICD-10-CM

## 2022-05-26 NOTE — Telephone Encounter (Signed)
Future visit in 3 months.  Requested Prescriptions  Pending Prescriptions Disp Refills   rosuvastatin (CRESTOR) 10 MG tablet [Pharmacy Med Name: Rosuvastatin Calcium 10 MG Oral Tablet] 100 tablet 0    Sig: TAKE 1 TABLET BY MOUTH DAILY     Cardiovascular:  Antilipid - Statins 2 Failed - 05/24/2022  6:04 AM      Failed - Lipid Panel in normal range within the last 12 months    Cholesterol, Total  Date Value Ref Range Status  07/15/2016 202 (H) 100 - 199 mg/dL Final   Cholesterol  Date Value Ref Range Status  02/19/2022 115 <200 mg/dL Final   LDL Cholesterol (Calc)  Date Value Ref Range Status  02/19/2022 44 mg/dL (calc) Final    Comment:    Reference range: <100 . Desirable range <100 mg/dL for primary prevention;   <70 mg/dL for patients with CHD or diabetic patients  with > or = 2 CHD risk factors. Marland Kitchen LDL-C is now calculated using the Martin-Hopkins  calculation, which is a validated novel method providing  better accuracy than the Friedewald equation in the  estimation of LDL-C.  Cresenciano Genre et al. Annamaria Helling. 7871;836(72): 2061-2068  (http://education.[facility].com/faq/FAQ164)    HDL  Date Value Ref Range Status  02/19/2022 48 (L) > OR = 50 mg/dL Final  07/15/2016 50 >39 mg/dL Final   Triglycerides  Date Value Ref Range Status  02/19/2022 147 <150 mg/dL Final         Passed - Cr in normal range and within 360 days    Creat  Date Value Ref Range Status  02/19/2022 0.84 0.60 - 1.00 mg/dL Final   Creatinine, Urine  Date Value Ref Range Status  08/14/2021 98 20 - 275 mg/dL Final         Passed - Patient is not pregnant      Passed - Valid encounter within last 12 months    Recent Outpatient Visits           2 months ago Annual physical exam   [facility] [place], Devonne Doughty, DO   8 months ago Dysfunction of both eustachian tubes   [place], DO   9 months ago Type 2  diabetes mellitus with other specified complication, without long-term current use of insulin [facility])   [place], DO   1 year ago Encounter for [employer] annual wellness exam   [facility] Olin Hauser, DO   1 year ago Annual physical exam   [facility] Olin Hauser, DO       Future Appointments             In 3 months [place], [facility], [facility], [facility]

## 2022-05-27 DIAGNOSIS — Z1389 Encounter for screening for other disorder: Secondary | ICD-10-CM | POA: Diagnosis not present

## 2022-05-27 DIAGNOSIS — I10 Essential (primary) hypertension: Secondary | ICD-10-CM | POA: Diagnosis not present

## 2022-05-27 DIAGNOSIS — Z7189 Other specified counseling: Secondary | ICD-10-CM | POA: Diagnosis not present

## 2022-05-27 DIAGNOSIS — Z713 Dietary counseling and surveillance: Secondary | ICD-10-CM | POA: Diagnosis not present

## 2022-05-27 DIAGNOSIS — Z7182 Exercise counseling: Secondary | ICD-10-CM | POA: Diagnosis not present

## 2022-05-27 NOTE — Telephone Encounter (Signed)
Requested Prescriptions  Pending Prescriptions Disp Refills   losartan (COZAAR) 50 MG tablet [Pharmacy Med Name: Losartan Potassium 50 MG Oral Tablet] 100 tablet 2    Sig: TAKE 1 TABLET BY MOUTH DAILY     Cardiovascular:  Angiotensin Receptor Blockers Failed - 05/25/2022 10:06 PM      Failed - Last BP in normal range    BP Readings from Last 1 Encounters:  02/26/22 (!) 143/50         Passed - Cr in normal range and within 180 days    Creat  Date Value Ref Range Status  02/19/2022 0.84 0.60 - 1.00 mg/dL Final   Creatinine, Urine  Date Value Ref Range Status  08/14/2021 98 20 - 275 mg/dL Final         Passed - K in normal range and within 180 days    Potassium  Date Value Ref Range Status  02/19/2022 4.3 3.5 - 5.3 mmol/L Final         Passed - Patient is not pregnant      Passed - Valid encounter within last 6 months    Recent Outpatient Visits           3 months ago Annual physical exam   [facility] Olin Hauser, DO   8 months ago Dysfunction of both eustachian tubes   [employer], DO   9 months ago Type 2 diabetes mellitus with other specified complication, without long-term current use of insulin [facility])   [place], DO   1 year ago Encounter for [employer] annual wellness exam   [facility] Olin Hauser, DO   1 year ago Annual physical exam   [facility] Olin Hauser, DO       Future Appointments             In 3 months [place], Devonne Doughty, [facility], [facility]

## 2022-06-05 ENCOUNTER — Encounter: Payer: Self-pay | Admitting: Emergency Medicine

## 2022-06-05 ENCOUNTER — Emergency Department: Payer: Medicare Other

## 2022-06-05 ENCOUNTER — Inpatient Hospital Stay
Admission: EM | Admit: 2022-06-05 | Discharge: 2022-06-08 | DRG: 690 | Disposition: A | Discharge status: Home / Self Care | Payer: Medicare Other | Attending: Student | Admitting: Student

## 2022-06-05 ENCOUNTER — Other Ambulatory Visit: Payer: Self-pay

## 2022-06-05 DIAGNOSIS — I10 Essential (primary) hypertension: Secondary | ICD-10-CM | POA: Diagnosis present

## 2022-06-05 DIAGNOSIS — Z8249 Family history of ischemic heart disease and other diseases of the circulatory system: Secondary | ICD-10-CM

## 2022-06-05 DIAGNOSIS — Z833 Family history of diabetes mellitus: Secondary | ICD-10-CM | POA: Diagnosis not present

## 2022-06-05 DIAGNOSIS — I471 Supraventricular tachycardia, unspecified: Secondary | ICD-10-CM | POA: Diagnosis not present

## 2022-06-05 DIAGNOSIS — Z803 Family history of malignant neoplasm of breast: Secondary | ICD-10-CM

## 2022-06-05 DIAGNOSIS — Z888 Allergy status to other drugs, medicaments and biological substances status: Secondary | ICD-10-CM

## 2022-06-05 DIAGNOSIS — E785 Hyperlipidemia, unspecified: Secondary | ICD-10-CM | POA: Diagnosis present

## 2022-06-05 DIAGNOSIS — Z79899 Other long term (current) drug therapy: Secondary | ICD-10-CM | POA: Diagnosis not present

## 2022-06-05 DIAGNOSIS — A419 Sepsis, unspecified organism: Secondary | ICD-10-CM

## 2022-06-05 DIAGNOSIS — N12 Tubulo-interstitial nephritis, not specified as acute or chronic: Secondary | ICD-10-CM | POA: Diagnosis not present

## 2022-06-05 DIAGNOSIS — Z7982 Long term (current) use of aspirin: Secondary | ICD-10-CM

## 2022-06-05 DIAGNOSIS — E872 Acidosis, unspecified: Secondary | ICD-10-CM | POA: Diagnosis not present

## 2022-06-05 DIAGNOSIS — Z885 Allergy status to narcotic agent status: Secondary | ICD-10-CM | POA: Diagnosis not present

## 2022-06-05 DIAGNOSIS — E871 Hypo-osmolality and hyponatremia: Secondary | ICD-10-CM | POA: Diagnosis present

## 2022-06-05 DIAGNOSIS — K5730 Diverticulosis of large intestine without perforation or abscess without bleeding: Secondary | ICD-10-CM | POA: Diagnosis not present

## 2022-06-05 DIAGNOSIS — Z7984 Long term (current) use of oral hypoglycemic drugs: Secondary | ICD-10-CM

## 2022-06-05 DIAGNOSIS — K449 Diaphragmatic hernia without obstruction or gangrene: Secondary | ICD-10-CM | POA: Diagnosis not present

## 2022-06-05 DIAGNOSIS — N136 Pyonephrosis: Principal | ICD-10-CM | POA: Diagnosis present

## 2022-06-05 DIAGNOSIS — B9620 Unspecified Escherichia coli [E. coli] as the cause of diseases classified elsewhere: Secondary | ICD-10-CM | POA: Diagnosis present

## 2022-06-05 DIAGNOSIS — N1330 Unspecified hydronephrosis: Secondary | ICD-10-CM | POA: Diagnosis not present

## 2022-06-05 DIAGNOSIS — E1165 Type 2 diabetes mellitus with hyperglycemia: Secondary | ICD-10-CM | POA: Diagnosis not present

## 2022-06-05 DIAGNOSIS — R9431 Abnormal electrocardiogram [ECG] [EKG]: Secondary | ICD-10-CM | POA: Diagnosis not present

## 2022-06-05 DIAGNOSIS — I700 Atherosclerosis of aorta: Secondary | ICD-10-CM | POA: Diagnosis not present

## 2022-06-05 DIAGNOSIS — R109 Unspecified abdominal pain: Secondary | ICD-10-CM | POA: Diagnosis present

## 2022-06-05 LAB — URINALYSIS, ROUTINE W REFLEX MICROSCOPIC
Bilirubin Urine: NEGATIVE
Glucose, UA: NEGATIVE mg/dL
Ketones, ur: NEGATIVE mg/dL
Nitrite: POSITIVE — AB
Protein, ur: 100 mg/dL — AB
Specific Gravity, Urine: 1.014 (ref 1.005–1.030)
WBC, UA: >50 WBC/hpf (ref 0–5)
pH: 5 (ref 5.0–8.0)

## 2022-06-05 LAB — CBC
HCT: 33.2 % — ABNORMAL LOW (ref 36.0–46.0)
Hemoglobin: 11.6 g/dL — ABNORMAL LOW (ref 12.0–15.0)
MCH: 29.5 pg (ref 26.0–34.0)
MCHC: 34.9 g/dL (ref 30.0–36.0)
MCV: 84.5 fL (ref 80.0–100.0)
Platelets: 235 10*3/uL (ref 150–400)
RBC: 3.93 MIL/uL (ref 3.87–5.11)
RDW: 12.5 % (ref 11.5–15.5)
WBC: 16.7 10*3/uL — ABNORMAL HIGH (ref 4.0–10.5)
nRBC: 0 % (ref 0.0–0.2)

## 2022-06-05 LAB — BASIC METABOLIC PANEL
Anion gap: 9 (ref 5–15)
BUN: 17 mg/dL (ref 8–23)
CO2: 18 mmol/L — ABNORMAL LOW (ref 22–32)
Calcium: 8.9 mg/dL (ref 8.9–10.3)
Chloride: 104 mmol/L (ref 98–111)
Creatinine, Ser: 0.9 mg/dL (ref 0.44–1.00)
GFR, Estimated: >60 mL/min (ref >60)
Glucose, Bld: 181 mg/dL — ABNORMAL HIGH (ref 70–99)
Potassium: 3.8 mmol/L (ref 3.5–5.1)
Sodium: 131 mmol/L — ABNORMAL LOW (ref 135–145)

## 2022-06-05 LAB — HEMOGLOBIN A1C
Hgb A1c MFr Bld: 6.9 % — ABNORMAL HIGH (ref 4.8–5.6)
Mean Plasma Glucose: 151.33 mg/dL

## 2022-06-05 LAB — LACTIC ACID, PLASMA
Lactic Acid, Venous: 2.5 mmol/L (ref 0.5–1.9)
Lactic Acid, Venous: 1 mmol/L (ref 0.5–1.9)

## 2022-06-05 MED ORDER — INSULIN ASPART 100 UNIT/ML IJ SOLN
0.0000 [IU] | Freq: Three times a day (TID) | INTRAMUSCULAR | Status: DC
  Administered 2022-06-06: 2 [IU] via SUBCUTANEOUS
  Administered 2022-06-07: 3 [IU] via SUBCUTANEOUS
  Administered 2022-06-08: 2 [IU] via SUBCUTANEOUS
  Administered 2022-06-08: 5 [IU] via SUBCUTANEOUS
  Filled 2022-06-05 (×4): qty 1

## 2022-06-05 MED ORDER — LOSARTAN POTASSIUM 50 MG PO TABS
50.0000 mg | ORAL_TABLET | Freq: Every day | ORAL | Status: DC
  Administered 2022-06-06 – 2022-06-07 (×2): 50 mg via ORAL
  Filled 2022-06-05 (×3): qty 1

## 2022-06-05 MED ORDER — FLUTICASONE PROPIONATE 50 MCG/ACT NA SUSP
2.0000 | Freq: Every day | NASAL | Status: DC
  Administered 2022-06-06 – 2022-06-08 (×2): 2 via NASAL
  Filled 2022-06-05: qty 16

## 2022-06-05 MED ORDER — PHENAZOPYRIDINE HCL 200 MG PO TABS: 200.0000 mg | ORAL_TABLET | Freq: Three times a day (TID) | ORAL | Status: DC | PRN

## 2022-06-05 MED ORDER — ACETAMINOPHEN 650 MG RE SUPP: 650.0000 mg | Freq: Four times a day (QID) | RECTAL | Status: DC | PRN

## 2022-06-05 MED ORDER — LORATADINE 10 MG PO TABS
10.0000 mg | ORAL_TABLET | Freq: Every day | ORAL | Status: DC
  Administered 2022-06-05 – 2022-06-07 (×3): 10 mg via ORAL
  Filled 2022-06-05 (×3): qty 1

## 2022-06-05 MED ORDER — ONDANSETRON HCL 4 MG/2ML IJ SOLN
4.0000 mg | Freq: Once | INTRAMUSCULAR | Status: AC
  Administered 2022-06-05: 4 mg via INTRAVENOUS
  Filled 2022-06-05: qty 2

## 2022-06-05 MED ORDER — SODIUM CHLORIDE 0.9 % IV SOLN
1.0000 g | INTRAVENOUS | Status: DC
  Administered 2022-06-06 – 2022-06-07 (×2): 1 g via INTRAVENOUS
  Filled 2022-06-05: qty 1
  Filled 2022-06-05 (×2): qty 10

## 2022-06-05 MED ORDER — SODIUM CHLORIDE 0.9 % IV SOLN: INTRAVENOUS | Status: AC

## 2022-06-05 MED ORDER — HYDROCODONE-ACETAMINOPHEN 5-325 MG PO TABS: 1.0000 | ORAL_TABLET | ORAL | Status: DC | PRN

## 2022-06-05 MED ORDER — ROSUVASTATIN CALCIUM 10 MG PO TABS
10.0000 mg | ORAL_TABLET | Freq: Every day | ORAL | Status: DC
  Administered 2022-06-06 – 2022-06-07 (×2): 10 mg via ORAL
  Filled 2022-06-05 (×3): qty 1

## 2022-06-05 MED ORDER — SODIUM BICARBONATE 650 MG PO TABS
650.0000 mg | ORAL_TABLET | Freq: Three times a day (TID) | ORAL | Status: DC
  Administered 2022-06-06 – 2022-06-08 (×7): 650 mg via ORAL
  Filled 2022-06-05 (×7): qty 1

## 2022-06-05 MED ORDER — BISACODYL 5 MG PO TBEC: 5.0000 mg | DELAYED_RELEASE_TABLET | Freq: Every day | ORAL | Status: DC | PRN

## 2022-06-05 MED ORDER — ACETAMINOPHEN 325 MG PO TABS
650.0000 mg | ORAL_TABLET | Freq: Four times a day (QID) | ORAL | Status: DC | PRN
  Administered 2022-06-06 (×2): 650 mg via ORAL
  Filled 2022-06-05 (×3): qty 2

## 2022-06-05 MED ORDER — ASPIRIN 81 MG PO TBEC
81.0000 mg | DELAYED_RELEASE_TABLET | Freq: Every day | ORAL | Status: DC
  Administered 2022-06-06 – 2022-06-08 (×3): 81 mg via ORAL
  Filled 2022-06-05 (×3): qty 1

## 2022-06-05 MED ORDER — SODIUM CHLORIDE 0.9% FLUSH: 3.0000 mL | INTRAVENOUS | Status: DC | PRN

## 2022-06-05 MED ORDER — SODIUM CHLORIDE 0.9% FLUSH
3.0000 mL | Freq: Two times a day (BID) | INTRAVENOUS | Status: DC
  Administered 2022-06-06 – 2022-06-08 (×5): 3 mL via INTRAVENOUS

## 2022-06-05 MED ORDER — ENOXAPARIN SODIUM 40 MG/0.4ML IJ SOSY
40.0000 mg | PREFILLED_SYRINGE | INTRAMUSCULAR | Status: DC
  Administered 2022-06-05 – 2022-06-07 (×3): 40 mg via SUBCUTANEOUS
  Filled 2022-06-05 (×3): qty 0.4

## 2022-06-05 MED ORDER — SODIUM BICARBONATE 650 MG PO TABS
650.0000 mg | ORAL_TABLET | Freq: Three times a day (TID) | ORAL | Status: DC
  Administered 2022-06-05: 650 mg via ORAL
  Filled 2022-06-05 (×2): qty 1

## 2022-06-05 MED ORDER — SODIUM CHLORIDE 0.9 % IV BOLUS
1000.0000 mL | Freq: Once | INTRAVENOUS | Status: AC
  Administered 2022-06-05: 1000 mL via INTRAVENOUS

## 2022-06-05 MED ORDER — POLYETHYLENE GLYCOL 3350 17 G PO PACK: 17.0000 g | PACK | Freq: Every day | ORAL | Status: DC | PRN

## 2022-06-05 MED ORDER — NAPROXEN 500 MG PO TABS: 500.0000 mg | ORAL_TABLET | Freq: Two times a day (BID) | ORAL | Status: DC

## 2022-06-05 MED ORDER — ONDANSETRON HCL 4 MG/2ML IJ SOLN: 4.0000 mg | Freq: Four times a day (QID) | INTRAMUSCULAR | Status: DC | PRN

## 2022-06-05 MED ORDER — SODIUM CHLORIDE 0.9 % IV SOLN: 250.0000 mL | INTRAVENOUS | Status: DC | PRN

## 2022-06-05 MED ORDER — SODIUM CHLORIDE 0.9 % IV SOLN
1.0000 g | Freq: Once | INTRAVENOUS | Status: AC
  Administered 2022-06-05: 1 g via INTRAVENOUS
  Filled 2022-06-05: qty 10

## 2022-06-05 MED ORDER — ONDANSETRON HCL 4 MG PO TABS: 4.0000 mg | ORAL_TABLET | Freq: Four times a day (QID) | ORAL | Status: DC | PRN

## 2022-06-05 MED ORDER — ACETAMINOPHEN 500 MG PO TABS
1000.0000 mg | ORAL_TABLET | Freq: Once | ORAL | Status: AC
  Administered 2022-06-05: 1000 mg via ORAL
  Filled 2022-06-05: qty 2

## 2022-06-05 NOTE — Sepsis Progress Note (Signed)
eLink is following this Code Sepsis. °

## 2022-06-05 NOTE — ED Provider Notes (Signed)
[facility] Provider Note    Event Date/Time   First MD Initiated Contact with Patient 06/05/22 1445     (approximate)  History   Chief Complaint: Flank Pain  HPI  Andrea Dodson is a 72 y.o. female with a past medical history of diabetes, hypertension, kidney stones, presents to the emergency department with right flank pain and dysuria.  According to the patient for the past 3 to 4 days she has not been feeling well, has been nauseated at times, weak at times.  States this morning around 2:00 in the morning she developed right flank pain.  Patient febrile to 103 degrees in the emergency department.  Physical Exam   Triage Vital Signs: ED Triage Vitals  Enc Vitals Group     BP 06/05/22 1256 (!) 148/70     Pulse Rate 06/05/22 1256 99     Resp 06/05/22 1256 16     Temp 06/05/22 1256 99.4 F (37.4 C)     Temp Source 06/05/22 1256 Oral     SpO2 06/05/22 1256 96 %     Weight 06/05/22 1258 147 lb (66.7 kg)     Height 06/05/22 1258 5' 2"$  (1.575 m)     Head Circumference --      Peak Flow --      Pain Score 06/05/22 1257 8     Pain Loc --      Pain Edu? --      Excl. in [place]? --     Most recent vital signs: Vitals:   06/05/22 1256  BP: (!) 148/70  Pulse: 99  Resp: 16  Temp: 99.4 F (37.4 C)  SpO2: 96%    General: Awake, no distress.  CV:  Good peripheral perfusion.  Regular rate and rhythm  Resp:  Normal effort.  Equal breath sounds bilaterally.  Abd:  No distention.  Soft, mild left-sided abdominal tenderness to palpation without rebound or guarding.   ED Results / Procedures / Treatments   RADIOLOGY  I have reviewed and interpreted the CT images.  No obvious stone seen on my evaluation. Radiology has read the CT scan is left-sided hydro nephrosis but no stone.   MEDICATIONS ORDERED IN ED: Medications  cefTRIAXone (ROCEPHIN) 1 g in sodium chloride 0.9 % 100 mL IVPB (0 g Intravenous Stopped 06/05/22 1613)  sodium chloride 0.9 % bolus  1,000 mL (1,000 mLs Intravenous New Bag/Given 06/05/22 1613)  ondansetron (ZOFRAN) injection 4 mg (4 mg Intravenous Given 06/05/22 1614)     IMPRESSION / MDM / ASSESSMENT AND PLAN / ED COURSE  I reviewed the triage vital signs and the nursing notes.  Patient's presentation is most consistent with acute presentation with potential threat to life or bodily function.  Patient presents emergency department for abdominal pain fever nausea weakness.  Patient's workup today shows moderate leukocytosis of 16,000 otherwise reassuring CBC, chemistry is reassuring including normal renal function.  Patient's urinalysis however appears grossly infected with white blood cell clumps many bacteria and nitrite positive and greater than 50 white blood cells.  Patient is now febrile to 103 degrees.  We will dose Tylenol.  Patient has received IV Rocephin I have sent a urine culture on the urine.  CT scan appears to show at least signs of possible pyelonephritis with no obvious stone.  Given the patient's weakness and nausea and now febrile with moderate leukocytosis meets sepsis criteria.  We will continue IV antibiotics and blood cultures and admit to the hospital service  for further treatment.  CRITICAL CARE Performed by: Harvest Dark   Total critical care time: 30 minutes  Critical care time was exclusive of separately billable procedures and treating other patients.  Critical care was necessary to treat or prevent imminent or life-threatening deterioration.  Critical care was time spent personally by me on the following activities: development of treatment plan with patient and/or surrogate as well as nursing, discussions with consultants, evaluation of patient's response to treatment, examination of patient, obtaining history from patient or surrogate, ordering and performing treatments and interventions, ordering and review of laboratory studies, ordering and review of radiographic studies, pulse  oximetry and re-evaluation of patient's condition.   FINAL CLINICAL IMPRESSION(S) / ED DIAGNOSES   Sepsis Urinary tract infection Pyelonephritis   Note:  This document was prepared using Dragon voice recognition software and may include unintentional dictation errors.   Harvest Dark, MD 06/05/22 1731

## 2022-06-05 NOTE — ED Triage Notes (Signed)
Pt states that she was awakened at 0200 this am with right flank pain, pt reports hx of kidney stones

## 2022-06-05 NOTE — Sepsis Progress Note (Signed)
Notified bedside nurse of need to draw repeat lactic acid. 

## 2022-06-05 NOTE — ED Notes (Signed)
Received critical value of lactic acid of 2.5, Dr. Dwyane Dee notified, orders given, see [facility]

## 2022-06-05 NOTE — H&P (Signed)
[facility] History and Physical   Patient: Andrea Dodson [id number]   PCP: Olin Hauser, DO DOB: October 12, 1950   DOA: 06/05/2022   DOS: 06/05/2022   DOS: the patient was seen and examined on 06/05/2022  Patient coming from: The patient is coming from Home  Chief Complaint: Abd Pain  HPI: Andrea Dodson is a 72 y.o. female with Past medical history of NIDDM T2, HTN, HLD, renal stone, presented to [facility] ED with complaining of left flank and lower abdominal pain and dysuria.  According to patient she was not feeling well for past 3 to 4 days, feeling weak and nauseated.  Today morning around 2 AM she woke up with pain in the abdomen.  In the ED patient had fever Tmax 103.  ED Course:  WBC 16.7 elevated, lactic acid 2.5 slightly elevated.  UA positive for UTI CT stone: Left-sided hydronephrosis and hydroureter suggesting recent passage of a stone or pyelonephritis. 2. No nephrolithiasis identified currently. 3. Diverticulosis. Hyponatremia sodium 131, CO2 18 mild metabolic acidosis, hyperglycemia serum glucose 181. [facility] hospitalist consulted for admission and further management  Review of Systems: as mentioned in the history of present illness.  All other systems reviewed and are negative.  Past Medical History:  Diagnosis Date   Allergy    Arthritis    right knee   Diabetes mellitus, type 2 ([place])    Hypertension    Kidney stone    Urinary incontinence    Wears dentures    full upper and lower   Past Surgical History:  Procedure Laterality Date   APPENDECTOMY     CATARACT EXTRACTION W/PHACO Left 08/08/2019   Procedure: CATARACT EXTRACTION PHACO AND INTRAOCULAR LENS PLACEMENT (IOC) LEFT 4.59  00:32.3;  Surgeon: Eulogio Bear, MD;  Location: [place];  Service: Ophthalmology;  Laterality: Left;  Diabetic - oral meds   CATARACT EXTRACTION W/PHACO Right 09/05/2019   Procedure: CATARACT EXTRACTION PHACO AND INTRAOCULAR LENS PLACEMENT (IOC) RIGHT DIABETIC  4.65  00:34.2;  Surgeon: Eulogio Bear, MD;  Location: [place];  Service: Ophthalmology;  Laterality: Right;  Diabetic - oral meds   CHOLECYSTECTOMY  1993   CYSTOSCOPY W/ RETROGRADES Bilateral 07/14/2021   Procedure: CYSTOSCOPY WITH RETROGRADE PYELOGRAM;  Surgeon: Primus Bravo., MD;  Location: [facility];  Service: Urology;  Laterality: Bilateral;   CYSTOSCOPY/URETEROSCOPY/HOLMIUM LASER/STENT PLACEMENT Left 09/22/2020   Procedure: CYSTOSCOPY/URETEROSCOPY/HOLMIUM LASER/STENT PLACEMENT;  Surgeon: Festus Aloe, MD;  Location: [facility];  Service: Urology;  Laterality: Left;   TONSILLECTOMY     URETEROSCOPY WITH HOLMIUM LASER LITHOTRIPSY Left 07/14/2021   Procedure: URETEROSCOPY WITH LEFT STENT PLACEMENT;  Surgeon: Primus Bravo., MD;  Location: [facility];  Service: Urology;  Laterality: Left;   Social History:  reports that she has never smoked. She has never been exposed to tobacco smoke. She has never used smokeless tobacco. She reports that she does not drink alcohol and does not use drugs.  Allergies  Allergen Reactions   Morphine Nausea And Vomiting   Codeine Nausea And Vomiting and Other (See Comments)    Nausea vomiting,  Muscle weakness   Lisinopril Cough    ACEi-cough     Family history reviewed and not pertinent Family History  Problem Relation Age of Onset   Heart failure Mother    Heart disease Mother    Diabetes Mother    Heart attack Mother    Cancer Father        lung   Heart attack Sister  Diabetes Sister    Breast cancer Half-Sister      Prior to Admission medications   Medication Sig Start Date End Date Taking? Authorizing Provider  aspirin EC 81 MG tablet Take 1 tablet (81 mg total) by mouth daily. 10/17/16  Yes Karamalegos, Devonne Doughty, DO  fluticasone (FLONASE) 50 MCG/ACT nasal spray Place 2 sprays into both nostrils daily. Use for 4-6 weeks then stop and use seasonally or as needed. 09/27/21  Yes Karamalegos, Devonne Doughty, DO   loratadine (CLARITIN) 10 MG tablet Take 1 tablet (10 mg total) by mouth daily. 01/20/17  Yes Karamalegos, Devonne Doughty, DO  losartan (COZAAR) 50 MG tablet Take 1 tablet (50 mg total) by mouth daily. 08/14/21  Yes Karamalegos, Devonne Doughty, DO  metFORMIN (GLUCOPHAGE) 500 MG tablet TAKE 1 TABLET BY MOUTH TWICE  DAILY WITH MEALS 04/23/22  Yes Karamalegos, Devonne Doughty, DO  naproxen (NAPROSYN) 500 MG tablet Take 1 tablet (500 mg total) by mouth 2 (two) times daily with a meal. 09/22/20  Yes Lavonia Drafts, MD  rosuvastatin (CRESTOR) 10 MG tablet TAKE 1 TABLET BY MOUTH DAILY 05/26/22  Yes Karamalegos, Devonne Doughty, DO  Blood Glucose Monitoring Suppl (ONE TOUCH ULTRA 2) w/Device KIT 1 device for checking blood sugar once daily 10/17/16   Karamalegos, Alexander J, DO  glucose blood (ONE TOUCH ULTRA TEST) test strip CHECK BLOOD SUGAR ONCE DAILY 10/07/17   Parks Ranger, Devonne Doughty, DO  Lancets [facility] ULTRASOFT) lancets Use as instructed 10/17/16   Parks Ranger, Devonne Doughty, DO  phenazopyridine (PYRIDIUM) 200 MG tablet Take 1 tablet (200 mg total) by mouth 3 (three) times daily as needed for pain. Patient not taking: Reported on 06/05/2022 07/14/21   Primus Bravo., MD  RYALTRIS [phone] MCG/ACT SUSP Place 1 spray into both nostrils 2 (two) times daily. Patient not taking: Reported on 06/05/2022 12/09/21   [provider]    Physical Exam: Vitals:   06/05/22 1256 06/05/22 1258 06/05/22 1710 06/05/22 1727  BP: (!) 148/70  (!) 140/70   Pulse: 99  90   Resp: 16  16   Temp: 99.4 F (37.4 C)  98.3 F (36.8 C) (!) 103 F (39.4 C)  TempSrc: Oral  Oral Oral  SpO2: 96%  97%   Weight:  66.7 kg    Height:  5' 2"$  (1.575 m)      General: alert and oriented to time, place, and person. Appear in mild distress, affect appropriate Eyes: PERRLA, Conjunctiva normal ENT: Oral Mucosa Clear, moist  Neck: no JVD, no Abnormal Mass Or lumps Cardiovascular: S1 and S2 Present, no Murmur, peripheral pulses  symmetrical Respiratory: good respiratory effort, Bilateral Air entry equal and Decreased, no signs of accessory muscle use, Clear to Auscultation, no Crackles, no wheezes Abdomen: Bowel Sound present, Soft and mild left flank and lower abd tenderness, no hernia Skin: no rashes  Extremities: non Pedal edema, no calf tenderness Neurologic: without any new focal findings Gait not checked due to patient safety concerns  Data Reviewed: I have personally reviewed and interpreted labs, imaging as discussed below.  CBC: Recent Labs  Lab 06/05/22 1225  WBC 16.7*  HGB 11.6*  HCT 33.2*  MCV 84.5  PLT [id number]   Basic Metabolic Panel: Recent Labs  Lab 06/05/22 1225  NA 131*  K 3.8  CL 104  CO2 18*  GLUCOSE 181*  BUN 17  CREATININE 0.90  CALCIUM 8.9   GFR: Estimated Creatinine Clearance: 51.3 mL/min (by C-G formula based on SCr of 0.9 mg/dL).  Liver Function Tests: No results for input(s): "AST", "ALT", "ALKPHOS", "BILITOT", "PROT", "ALBUMIN" in the last 168 hours. No results for input(s): "LIPASE", "AMYLASE" in the last 168 hours. No results for input(s): "AMMONIA" in the last 168 hours. Coagulation Profile: No results for input(s): "INR", "PROTIME" in the last 168 hours. Cardiac Enzymes: No results for input(s): "CKTOTAL", "CKMB", "CKMBINDEX", "TROPONINI" in the last 168 hours. BNP (last 3 results) No results for input(s): "PROBNP" in the last 8760 hours. HbA1C: No results for input(s): "HGBA1C" in the last 72 hours. CBG: No results for input(s): "GLUCAP" in the last 168 hours. Lipid Profile: No results for input(s): "CHOL", "HDL", "LDLCALC", "TRIG", "CHOLHDL", "LDLDIRECT" in the last 72 hours. Thyroid Function Tests: No results for input(s): "TSH", "T4TOTAL", "FREET4", "T3FREE", "THYROIDAB" in the last 72 hours. Anemia Panel: No results for input(s): "VITAMINB12", "FOLATE", "FERRITIN", "TIBC", "IRON", "RETICCTPCT" in the last 72 hours. Urine analysis:    Component Value  Date/Time   COLORURINE YELLOW (A) 06/05/2022 1315   APPEARANCEUR TURBID (A) 06/05/2022 1315   APPEARANCEUR Clear 12/07/2020 1129   LABSPEC 1.014 06/05/2022 1315   PHURINE 5.0 06/05/2022 1315   GLUCOSEU NEGATIVE 06/05/2022 1315   HGBUR MODERATE (A) 06/05/2022 1315   BILIRUBINUR NEGATIVE 06/05/2022 1315   BILIRUBINUR Negative 12/07/2020 1129   KETONESUR NEGATIVE 06/05/2022 1315   PROTEINUR 100 (A) 06/05/2022 1315   NITRITE POSITIVE (A) 06/05/2022 1315   LEUKOCYTESUR LARGE (A) 06/05/2022 1315    Radiological Exams on Admission: CT Renal Stone Study  Result Date: 06/05/2022 CLINICAL DATA:  Flank pain EXAM: CT ABDOMEN AND PELVIS WITHOUT CONTRAST TECHNIQUE: Multidetector CT imaging of the abdomen and pelvis was performed following the standard protocol without IV contrast. RADIATION DOSE REDUCTION: This exam was performed according to the departmental dose-optimization program which includes automated exposure control, adjustment of the mA and/or kV according to patient size and/or use of iterative reconstruction technique. COMPARISON:  07/14/2021 FINDINGS: Lower chest: No acute abnormality.  There is a small hiatal hernia. Hepatobiliary: No focal liver abnormality is seen. Status post cholecystectomy. No biliary dilatation. Pancreas: Unremarkable. No pancreatic ductal dilatation or surrounding inflammatory changes. Spleen: Normal in size without focal abnormality. Adrenals/Urinary Tract: Renal cortical thinning consistent with scarring identified on the right. On the left there is hydronephrosis and hydroureter as well as perinephric stranding. No stones are seen in the kidneys or ureters. Unremarkable urinary bladder. Stomach/Bowel: Stomach is within normal limits. Appendix appears normal. No evidence of bowel wall thickening, distention, or inflammatory changes. Diverticulosis noted of the descending and sigmoid. Vascular/Lymphatic: Aortic atherosclerosis. No enlarged abdominal or pelvic lymph nodes.  Reproductive: Prostate is unremarkable. Other: No abdominal wall hernia or abnormality. No abdominopelvic ascites. Musculoskeletal: No acute or significant osseous findings. IMPRESSION: 1. Left-sided hydronephrosis and hydroureter suggesting recent passage of a stone or pyelonephritis. 2. No nephrolithiasis identified currently. 3. Diverticulosis. Electronically Signed   By: Sammie Bench M.D.   On: 06/05/2022 16:19     I reviewed all nursing notes, pharmacy notes, vitals, pertinent old records.  Assessment/Plan Principal Problem:   Pyelonephritis   Acute left-sided pyelonephritis with hydroureter CT stone:Left-sided hydronephrosis and hydroureter suggesting recent passage of a stone or pyelonephritis. UA positive Started ceftriaxone 1 g IV daily Follow urine culture Follow blood culture   # HTN, HLD Resumed home meds aspirin, Crestor, losartan 50 mg p.o. daily Monitor BP and titrate medication accordingly   # NIDDM T2 Held home medications for now Started NovoLog sliding scale Follow A1c Monitor CBG, continue diabetic diet  Nutrition: Carb modified diet DVT Prophylaxis: Subcutaneous Lovenox  Advance goals of care discussion: Full code   Consults: None    Family Communication: family was not present at bedside, at the time of interview.  Opportunity was given to ask question and all questions were answered satisfactorily.  Disposition: Admitted as inpatient, telemetry unit. Likely to be discharged home, in 2-3 days.  I have discussed plan of care as described above with RN and patient/family.  Severity of Illness: The appropriate patient status for this patient is INPATIENT. Inpatient status is judged to be reasonable and necessary in order to provide the required intensity of service to ensure the patient's safety. The patient's presenting symptoms, physical exam findings, and initial radiographic and laboratory data in the context of their chronic comorbidities is  felt to place them at high risk for further clinical deterioration. Furthermore, it is not anticipated that the patient will be medically stable for discharge from the hospital within 2 midnights of admission.   * I certify that at the point of admission it is my clinical judgment that the patient will require inpatient hospital care spanning beyond 2 midnights from the point of admission due to high intensity of service, high risk for further deterioration and high frequency of surveillance required.*   Author: Val Riles, MD [facility] 06/05/2022 8:06 PM   To reach On-call, see care teams to locate the attending and reach out to them via [email or web address]. If 7PM-7AM, please contact night-coverage If you still have difficulty reaching the attending provider, please page the [facility] (Director on Call) for [facility] on amion for assistance.

## 2022-06-05 NOTE — Consult Note (Signed)
CODE SEPSIS - PHARMACY COMMUNICATION  **Broad Spectrum Antibiotics should be administered within 1 hour of Sepsis diagnosis**  Time Code Sepsis Called/Page Received: 1732  Antibiotics Ordered: Ceftriaxone  Time of 1st antibiotic administration: 1554  Additional action taken by pharmacy: N/A  Gretel Acre, PharmD PGY1 Pharmacy Resident 06/05/2022 5:34 PM

## 2022-06-06 ENCOUNTER — Encounter: Payer: Self-pay | Admitting: Student

## 2022-06-06 DIAGNOSIS — N12 Tubulo-interstitial nephritis, not specified as acute or chronic: Secondary | ICD-10-CM | POA: Diagnosis not present

## 2022-06-06 LAB — PHOSPHORUS: Phosphorus: 2.6 mg/dL (ref 2.5–4.6)

## 2022-06-06 LAB — BASIC METABOLIC PANEL
Anion gap: 8 (ref 5–15)
BUN: 16 mg/dL (ref 8–23)
CO2: 20 mmol/L — ABNORMAL LOW (ref 22–32)
Calcium: 7.6 mg/dL — ABNORMAL LOW (ref 8.9–10.3)
Chloride: 101 mmol/L (ref 98–111)
Creatinine, Ser: 0.91 mg/dL (ref 0.44–1.00)
GFR, Estimated: >60 mL/min (ref >60)
Glucose, Bld: 128 mg/dL — ABNORMAL HIGH (ref 70–99)
Potassium: 3.8 mmol/L (ref 3.5–5.1)
Sodium: 129 mmol/L — ABNORMAL LOW (ref 135–145)

## 2022-06-06 LAB — GLUCOSE, CAPILLARY
Glucose-Capillary: 110 mg/dL — ABNORMAL HIGH (ref 70–99)
Glucose-Capillary: 133 mg/dL — ABNORMAL HIGH (ref 70–99)

## 2022-06-06 LAB — CBC
HCT: 28.1 % — ABNORMAL LOW (ref 36.0–46.0)
Hemoglobin: 9.8 g/dL — ABNORMAL LOW (ref 12.0–15.0)
MCH: 30.1 pg (ref 26.0–34.0)
MCHC: 34.9 g/dL (ref 30.0–36.0)
MCV: 86.2 fL (ref 80.0–100.0)
Platelets: 190 10*3/uL (ref 150–400)
RBC: 3.26 MIL/uL — ABNORMAL LOW (ref 3.87–5.11)
RDW: 12.8 % (ref 11.5–15.5)
WBC: 13.4 10*3/uL — ABNORMAL HIGH (ref 4.0–10.5)
nRBC: 0 % (ref 0.0–0.2)

## 2022-06-06 LAB — CBG MONITORING, ED
Glucose-Capillary: 108 mg/dL — ABNORMAL HIGH (ref 70–99)
Glucose-Capillary: 146 mg/dL — ABNORMAL HIGH (ref 70–99)

## 2022-06-06 LAB — MAGNESIUM: Magnesium: 1.4 mg/dL — ABNORMAL LOW (ref 1.7–2.4)

## 2022-06-06 LAB — OSMOLALITY: Osmolality: 280 mOsm/kg (ref 275–295)

## 2022-06-06 MED ORDER — MAGNESIUM SULFATE 4 GM/100ML IV SOLN
4.0000 g | Freq: Once | INTRAVENOUS | Status: AC
  Administered 2022-06-06: 4 g via INTRAVENOUS
  Filled 2022-06-06: qty 100

## 2022-06-06 NOTE — Progress Notes (Signed)
[facility] Progress Note  Patient: Andrea Dodson    [id number]  DOA: 06/05/2022     Date of Service: the patient was seen and examined on 06/06/2022  Chief Complaint  Patient presents with   Flank Pain   Brief hospital course:  Andrea Dodson is a 72 y.o. female with Past medical history of NIDDM T2, HTN, HLD, renal stone, presented to [facility] ED with complaining of left flank and lower abdominal pain and dysuria.  According to patient she was not feeling well for past 3 to 4 days, feeling weak and nauseated.  Today morning around 2 AM she woke up with pain in the abdomen.  In the ED patient had fever Tmax 103.   ED Course:  WBC 16.7 elevated, lactic acid 2.5 slightly elevated.  UA positive for UTI CT stone: Left-sided hydronephrosis and hydroureter suggesting recent passage of a stone or pyelonephritis. 2. No nephrolithiasis identified currently. 3. Diverticulosis. Hyponatremia sodium 131, CO2 18 mild metabolic acidosis, hyperglycemia serum glucose 181. [facility] hospitalist consulted for admission and further management    Assessment and Plan:  # Acute left-sided pyelonephritis with hydroureter CT stone:Left-sided hydronephrosis and hydroureter suggesting recent passage of a stone or pyelonephritis. UA positive Started ceftriaxone 1 g IV daily Follow urine culture Follow blood culture     # HTN, HLD Resumed home meds aspirin, Crestor, losartan 50 mg p.o. daily Monitor BP and titrate medication accordingly     # NIDDM T2, HbA1c 6.9, well-controlled Held home medications for now Started NovoLog sliding scale Follow A1c Monitor CBG, continue diabetic diet   Isotonic hyponatremia serum osmolality 280 WNL Na 131--129 Continue to monitor sodium level  Hypomagnesemia, mag repleted. Monitor electrolytes and replete as needed.   Body mass index is 26.89 kg/m.  Interventions:   Diet: Diabetic diet DVT Prophylaxis: Subcutaneous Lovenox   Advance goals of care  discussion: Full code  Family Communication: family was not present at bedside, at the time of interview.  The pt provided permission to discuss medical plan with the family. Opportunity was given to ask question and all questions were answered satisfactorily.   Disposition:  Pt is from Home, admitted with pyelonephritis, blood culture and urine culture still pending, which precludes a safe discharge. Discharge to home, when clinically stable, may need 1-2 more days to improve..  Subjective: No significant overnight events, patient was complaining of abdominal pain 4/10, which is constant, denies any nausea vomiting, no diarrhea.  No chest pain or palpitation, no shortness of breath.   Physical Exam: General: NAD, lying comfortably Appear in no distress, affect appropriate Eyes: PERRLA ENT: Oral Mucosa Clear, moist  Neck: no JVD,  Cardiovascular: S1 and S2 Present, no Murmur,  Respiratory: good respiratory effort, Bilateral Air entry equal and Decreased, no Crackles, no wheezes Abdomen: Bowel Sound present, Soft and no tenderness,  Skin: no rashes Extremities: no Pedal edema, no calf tenderness Neurologic: without any new focal findings Gait not checked due to patient safety concerns  Vitals:   06/06/22 0800 06/06/22 0900 06/06/22 1137 06/06/22 1154  BP: (!) 125/56 (!) 119/58  133/62  Pulse: 81 85  79  Resp: 16 16  15  $ Temp:   97.9 F (36.6 C) 97.9 F (36.6 C)  TempSrc:   Oral Oral  SpO2: 95% 96%  97%  Weight:      Height:        Intake/Output Summary (Last 24 hours) at 06/06/2022 1253 Last data filed at 06/05/2022 1613 Gross per 24 hour  Intake 100 ml  Output --  Net 100 ml   Filed Weights   06/05/22 1258  Weight: 66.7 kg    Data Reviewed: I have personally reviewed and interpreted daily labs, tele strips, imagings as discussed above. I reviewed all nursing notes, pharmacy notes, vitals, pertinent old records I have discussed plan of care as described above with  RN and patient/family.  CBC: Recent Labs  Lab 06/05/22 1225 06/06/22 0510  WBC 16.7* 13.4*  HGB 11.6* 9.8*  HCT 33.2* 28.1*  MCV 84.5 86.2  PLT 235 [id number]   Basic Metabolic Panel: Recent Labs  Lab 06/05/22 1225 06/06/22 0510  NA 131* 129*  K 3.8 3.8  CL 104 101  CO2 18* 20*  GLUCOSE 181* 128*  BUN 17 16  CREATININE 0.90 0.91  CALCIUM 8.9 7.6*  MG  --  1.4*  PHOS  --  2.6    Studies: CT Renal Stone Study  Result Date: 06/05/2022 CLINICAL DATA:  Flank pain EXAM: CT ABDOMEN AND PELVIS WITHOUT CONTRAST TECHNIQUE: Multidetector CT imaging of the abdomen and pelvis was performed following the standard protocol without IV contrast. RADIATION DOSE REDUCTION: This exam was performed according to the departmental dose-optimization program which includes automated exposure control, adjustment of the mA and/or kV according to patient size and/or use of iterative reconstruction technique. COMPARISON:  07/14/2021 FINDINGS: Lower chest: No acute abnormality.  There is a small hiatal hernia. Hepatobiliary: No focal liver abnormality is seen. Status post cholecystectomy. No biliary dilatation. Pancreas: Unremarkable. No pancreatic ductal dilatation or surrounding inflammatory changes. Spleen: Normal in size without focal abnormality. Adrenals/Urinary Tract: Renal cortical thinning consistent with scarring identified on the right. On the left there is hydronephrosis and hydroureter as well as perinephric stranding. No stones are seen in the kidneys or ureters. Unremarkable urinary bladder. Stomach/Bowel: Stomach is within normal limits. Appendix appears normal. No evidence of bowel wall thickening, distention, or inflammatory changes. Diverticulosis noted of the descending and sigmoid. Vascular/Lymphatic: Aortic atherosclerosis. No enlarged abdominal or pelvic lymph nodes. Reproductive: Prostate is unremarkable. Other: No abdominal wall hernia or abnormality. No abdominopelvic ascites. Musculoskeletal:  No acute or significant osseous findings. IMPRESSION: 1. Left-sided hydronephrosis and hydroureter suggesting recent passage of a stone or pyelonephritis. 2. No nephrolithiasis identified currently. 3. Diverticulosis. Electronically Signed   By: Sammie Bench M.D.   On: 06/05/2022 16:19    Scheduled Meds:  aspirin EC  81 mg Oral Daily   enoxaparin (LOVENOX) injection  40 mg Subcutaneous Q24H   fluticasone  2 spray Each Nare Daily   insulin aspart  0-15 Units Subcutaneous TID WC   loratadine  10 mg Oral QHS   losartan  50 mg Oral Daily   rosuvastatin  10 mg Oral Daily   sodium bicarbonate  650 mg Oral TID   sodium chloride flush  3 mL Intravenous Q12H   Continuous Infusions:  sodium chloride     sodium chloride 100 mL/hr at 06/06/22 1156   cefTRIAXone (ROCEPHIN)  IV     PRN Meds: sodium chloride, acetaminophen **OR** acetaminophen, bisacodyl, HYDROcodone-acetaminophen, ondansetron **OR** ondansetron (ZOFRAN) IV, phenazopyridine, polyethylene glycol, sodium chloride flush  Time spent: 35 minutes  Author: Val Riles. MD [facility] 06/06/2022 12:53 PM  To reach On-call, see care teams to locate the attending and reach out to them via [email or web address]. If 7PM-7AM, please contact night-coverage If you still have difficulty reaching the attending provider, please page the [facility] (Director on Call) for [facility] on amion for assistance.

## 2022-06-06 NOTE — TOC Initial Note (Signed)
Transition of Care [facility]) - Initial/Assessment Note    Patient Details  Name: Andrea Dodson MRN: [id number] Date of Birth: 21-Aug-1950  Transition of Care [facility]) CM/SW Contact:    Laurena Slimmer, RN Phone Number: 06/06/2022, 12:40 PM  Clinical Narrative:                  Transition of Care [facility]) Screening Note   Patient Details  Name: Andrea Dodson Date of Birth: 09-01-1950   Transition of Care [facility]) CM/SW Contact:    Laurena Slimmer, RN Phone Number: 06/06/2022, 12:40 PM    Transition of Care [facility]) has reviewed patient and no TOC needs have been identified at this time. We will continue to monitor patient advancement through interdisciplinary progression rounds. If new patient transition needs arise, please place a TOC consult.          Patient Goals and CMS Choice            Expected Discharge Plan and Services                                              Prior Living Arrangements/Services                       Activities of Daily Living Home Assistive Devices/Equipment: Eyeglasses ADL Screening (condition at time of admission) Patient's cognitive ability adequate to safely complete daily activities?: Yes Is the patient deaf or have difficulty hearing?: No Does the patient have difficulty seeing, even when wearing glasses/contacts?: No Does the patient have difficulty concentrating, remembering, or making decisions?: No Patient able to express need for assistance with ADLs?: Yes Does the patient have difficulty dressing or bathing?: No Independently performs ADLs?: Yes (appropriate for developmental age) Does the patient have difficulty walking or climbing stairs?: No Weakness of Legs: None Weakness of Arms/Hands: None  Permission Sought/Granted                  Emotional Assessment              Admission diagnosis:  Pyelonephritis [N12] Sepsis, due to unspecified organism, unspecified whether acute organ  dysfunction present [facility]) [A41.9] Patient Active Problem List   Diagnosis Date Noted   Pyelonephritis 06/05/2022   Lesion of skin of scalp 08/15/2021   Epidermal inclusion cyst 08/15/2021   Hydronephrosis with obstructing calculus 09/22/2020   Acute lower UTI 09/22/2020   Salivary gland swelling 02/11/2019   Environmental and seasonal allergies 08/12/2018   Hyperlipidemia associated with type 2 diabetes mellitus ([place]) 01/20/2017   Overweight (BMI 25.0-29.9) 07/11/2016   Type 2 diabetes mellitus with other specified complication ([place]) [id number]   Essential hypertension 07/11/2016   Colon cancer screening 07/11/2016   H/O seasonal allergies 07/08/2016   Osteoarthritis of right knee 07/08/2016   Urinary incontinence 07/08/2016   PCP:  Olin Hauser, DO Pharmacy:   [facility] [phone] [address], [place] AT [facility] [address] [place] [address] Phone: [phone] Fax: [phone]  [facility] ([place]) - [place], [place] [facility] [address] [place] [address] [place] [address] Phone: [phone] Fax: [place], [place]  [place] [address] Phone: [phone] Fax: [phone]     Social Determinants of Health (SDOH) Social History: [place]: No Food Insecurity (06/06/2022)  Housing: Low Risk  (06/06/2022)  Transportation Needs: No Transportation Needs (06/06/2022)  Utilities: Not At Risk (06/06/2022)  Alcohol Screen: Low Risk  (02/26/2021)  Depression (PHQ2-9): Low Risk  (02/26/2022)  Financial Resource Strain: Low Risk  (02/26/2021)  Physical Activity: Inactive (02/26/2021)  Social Connections: Moderately Isolated (02/26/2021)  Stress: No Stress Concern Present (02/26/2021)  Tobacco Use: Low Risk  (06/06/2022)   SDOH Interventions:     Readmission Risk  Interventions     No data to display

## 2022-06-07 DIAGNOSIS — N12 Tubulo-interstitial nephritis, not specified as acute or chronic: Secondary | ICD-10-CM | POA: Diagnosis not present

## 2022-06-07 LAB — CBC
HCT: 27.6 % — ABNORMAL LOW (ref 36.0–46.0)
Hemoglobin: 9.7 g/dL — ABNORMAL LOW (ref 12.0–15.0)
MCH: 29.7 pg (ref 26.0–34.0)
MCHC: 35.1 g/dL (ref 30.0–36.0)
MCV: 84.4 fL (ref 80.0–100.0)
Platelets: 198 10*3/uL (ref 150–400)
RBC: 3.27 MIL/uL — ABNORMAL LOW (ref 3.87–5.11)
RDW: 12.6 % (ref 11.5–15.5)
WBC: 9.7 10*3/uL (ref 4.0–10.5)
nRBC: 0 % (ref 0.0–0.2)

## 2022-06-07 LAB — GLUCOSE, CAPILLARY
Glucose-Capillary: 84 mg/dL (ref 70–99)
Glucose-Capillary: 165 mg/dL — ABNORMAL HIGH (ref 70–99)
Glucose-Capillary: 114 mg/dL — ABNORMAL HIGH (ref 70–99)
Glucose-Capillary: 159 mg/dL — ABNORMAL HIGH (ref 70–99)

## 2022-06-07 LAB — BASIC METABOLIC PANEL
Anion gap: 8 (ref 5–15)
BUN: 16 mg/dL (ref 8–23)
CO2: 20 mmol/L — ABNORMAL LOW (ref 22–32)
Calcium: 7.7 mg/dL — ABNORMAL LOW (ref 8.9–10.3)
Chloride: 103 mmol/L (ref 98–111)
Creatinine, Ser: 0.79 mg/dL (ref 0.44–1.00)
GFR, Estimated: >60 mL/min (ref >60)
Glucose, Bld: 88 mg/dL (ref 70–99)
Potassium: 3.3 mmol/L — ABNORMAL LOW (ref 3.5–5.1)
Sodium: 131 mmol/L — ABNORMAL LOW (ref 135–145)

## 2022-06-07 LAB — PHOSPHORUS: Phosphorus: 2.3 mg/dL — ABNORMAL LOW (ref 2.5–4.6)

## 2022-06-07 LAB — MAGNESIUM: Magnesium: 2.2 mg/dL (ref 1.7–2.4)

## 2022-06-07 MED ORDER — GUAIFENESIN-DM 100-10 MG/5ML PO SYRP
5.0000 mL | ORAL_SOLUTION | ORAL | Status: DC | PRN
  Administered 2022-06-07 (×2): 5 mL via ORAL
  Filled 2022-06-07 (×2): qty 10

## 2022-06-07 MED ORDER — K PHOS MONO-SOD PHOS DI & MONO 155-852-130 MG PO TABS
500.0000 mg | ORAL_TABLET | Freq: Four times a day (QID) | ORAL | Status: AC
  Administered 2022-06-07 (×2): 500 mg via ORAL
  Filled 2022-06-07 (×3): qty 2

## 2022-06-07 MED ORDER — POTASSIUM CHLORIDE CRYS ER 20 MEQ PO TBCR
40.0000 meq | EXTENDED_RELEASE_TABLET | Freq: Once | ORAL | Status: AC
  Administered 2022-06-07: 40 meq via ORAL
  Filled 2022-06-07: qty 2

## 2022-06-07 MED ORDER — AMOXICILLIN-POT CLAVULANATE 875-125 MG PO TABS: 1.0000 | ORAL_TABLET | Freq: Two times a day (BID) | ORAL | 0 refills | Status: DC

## 2022-06-07 NOTE — Discharge Summary (Signed)
[facility] Discharge Summary   Patient: Andrea Dodson [id number]  PCP: Olin Hauser, DO  Date of admission: 06/05/2022   Date of discharge:  06/07/2022     Discharge Diagnoses:  Principal Problem:   Pyelonephritis   Admitted From: Home Disposition:  Home   Recommendations for Outpatient Follow-up:  PCP: in 1 wk Follow up LABS/TEST:     Diet recommendation: Carb modified diet  Activity: The patient is advised to gradually reintroduce usual activities, as tolerated  Discharge Condition: stable  Code Status: Full code   History of present illness: As per the H and P dictated on admission Hospital Course:  Andrea Dodson is a 72 y.o. female with Past medical history of NIDDM T2, HTN, HLD, renal stone, presented to [facility] ED with complaining of left flank and lower abdominal pain and dysuria.  According to patient she was not feeling well for past 3 to 4 days, feeling weak and nauseated.  Today morning around 2 AM she woke up with pain in the abdomen.  In the ED patient had fever Tmax 103. ED Course: WBC 16.7 elevated, lactic acid 2.5 slightly elevated.  UA positive for UTI CT stone: Left-sided hydronephrosis and hydroureter suggesting recent passage of a stone or pyelonephritis. 2. No nephrolithiasis identified currently. 3. Diverticulosis. Hyponatremia sodium 131, CO2 18 mild metabolic acidosis, hyperglycemia S.Glucose 181. [facility] hospitalist consulted for admission and further management   Assessment and Plan: # Acute left-sided pyelonephritis with hydroureter CT stone:Left-sided hydronephrosis and hydroureter suggesting recent passage of a stone or pyelonephritis. UA positive, blood culture NGTD.  Urine culture grew E. coli, sensitive to nitrofurantoin and ceftriaxone. S/p ceftriaxone 1 g IV daily given during hospital stay, discharged on Macrobid 100 mg p.o. twice daily for 12 days to complete 14-day course.  Patient remained stable and asymptomatic.  Agreed for  discharge planning. # SVT, Patient had an episode of SVT heart rate 150s, patient remained asymptomatic.  Patient was monitored on telemetry, no significant events overnight.  Troponin x 2 negative TTE LVEF 55 to 60%, no regional wall motion abnormality.  Grade 1 diastolic dysfunction.  No any other significant findings. # HTN, HLD. Resumed home meds aspirin, Crestor, losartan 50 mg p.o. daily # NIDDM T2, HbA1c 6.9, well-controlled.  Patient was on NovoLog sliding scale during hospital stay, resumed home medication on discharge.  Recommended to continue diabetic diet and monitor FSBG. # Isotonic hyponatremia, serum osmolality 280 WNL, Na 131--129--133 improving. # Hypomagnesemia, mag repleted.  Resolved  Body mass index is 26.89 kg/m.  Nutrition Interventions:   Patient was ambulatory without any assistance. On the day of the discharge the patient's vitals were stable, and no other acute medical condition were reported by patient. the patient was felt safe to be discharge at Home   Consultants: None Procedures: None  Discharge Exam: General: Appear in no distress, no Rash; Oral Mucosa Clear, moist. Cardiovascular: S1 and S2 Present, no Murmur, Respiratory: normal respiratory effort, Bilateral Air entry present and no Crackles, no wheezes Abdomen: Bowel Sound present, Soft and no tenderness, no hernia Extremities: no Pedal edema, no calf tenderness Neurology: alert and oriented to time, place, and person affect appropriate.  Filed Weights   06/05/22 1258  Weight: 66.7 kg   Vitals:   06/07/22 0328 06/07/22 0737  BP: (!) 121/56 (!) 129/58  Pulse: 77 64  Resp: 18 16  Temp: 98.4 F (36.9 C) 98 F (36.7 C)  SpO2: 95% 99%    DISCHARGE MEDICATION: Allergies as of  06/07/2022       Reactions   Morphine Nausea And Vomiting   Codeine Nausea And Vomiting, Other (See Comments)   Nausea vomiting,  Muscle weakness   Lisinopril Cough   ACEi-cough        Medication List     TAKE  these medications    amoxicillin-clavulanate 875-125 MG tablet Commonly known as: AUGMENTIN Take 1 tablet by mouth 2 (two) times daily for 12 days.   aspirin EC 81 MG tablet Take 1 tablet (81 mg total) by mouth daily.   fluticasone 50 MCG/ACT nasal spray Commonly known as: FLONASE Place 2 sprays into both nostrils daily. Use for 4-6 weeks then stop and use seasonally or as needed.   glucose blood test strip Commonly known as: ONE TOUCH ULTRA TEST CHECK BLOOD SUGAR ONCE DAILY   loratadine 10 MG tablet Commonly known as: CLARITIN Take 1 tablet (10 mg total) by mouth daily.   losartan 50 MG tablet Commonly known as: COZAAR Take 1 tablet (50 mg total) by mouth daily.   metFORMIN 500 MG tablet Commonly known as: GLUCOPHAGE TAKE 1 TABLET BY MOUTH TWICE  DAILY WITH MEALS   naproxen 500 MG tablet Commonly known as: Naprosyn Take 1 tablet (500 mg total) by mouth 2 (two) times daily with a meal.   ONE TOUCH ULTRA 2 w/Device Kit 1 device for checking blood sugar once daily   onetouch ultrasoft lancets Use as instructed   phenazopyridine 200 MG tablet Commonly known as: Pyridium Take 1 tablet (200 mg total) by mouth 3 (three) times daily as needed for pain.   rosuvastatin 10 MG tablet Commonly known as: CRESTOR TAKE 1 TABLET BY MOUTH DAILY   Ryaltris 665-25 MCG/ACT Susp Generic drug: Olopatadine-Mometasone Place 1 spray into both nostrils 2 (two) times daily.       Allergies  Allergen Reactions   Morphine Nausea And Vomiting   Codeine Nausea And Vomiting and Other (See Comments)    Nausea vomiting,  Muscle weakness   Lisinopril Cough    ACEi-cough   Discharge Instructions     Call MD for:  difficulty breathing, headache or visual disturbances   Complete by: As directed    Call MD for:  extreme fatigue   Complete by: As directed    Call MD for:  persistant dizziness or light-headedness   Complete by: As directed    Call MD for:  persistant nausea and vomiting    Complete by: As directed    Call MD for:  severe uncontrolled pain   Complete by: As directed    Call MD for:  temperature >100.4   Complete by: As directed    Diet - low sodium heart healthy   Complete by: As directed    Discharge instructions   Complete by: As directed    Follow-up with PCP in 1 week   Increase activity slowly   Complete by: As directed        The results of significant diagnostics from this hospitalization (including imaging, microbiology, ancillary and laboratory) are listed below for reference.    Significant Diagnostic Studies: CT Renal Stone Study  Result Date: 06/05/2022 CLINICAL DATA:  Flank pain EXAM: CT ABDOMEN AND PELVIS WITHOUT CONTRAST TECHNIQUE: Multidetector CT imaging of the abdomen and pelvis was performed following the standard protocol without IV contrast. RADIATION DOSE REDUCTION: This exam was performed according to the departmental dose-optimization program which includes automated exposure control, adjustment of the mA and/or kV according to patient size  and/or use of iterative reconstruction technique. COMPARISON:  07/14/2021 FINDINGS: Lower chest: No acute abnormality.  There is a small hiatal hernia. Hepatobiliary: No focal liver abnormality is seen. Status post cholecystectomy. No biliary dilatation. Pancreas: Unremarkable. No pancreatic ductal dilatation or surrounding inflammatory changes. Spleen: Normal in size without focal abnormality. Adrenals/Urinary Tract: Renal cortical thinning consistent with scarring identified on the right. On the left there is hydronephrosis and hydroureter as well as perinephric stranding. No stones are seen in the kidneys or ureters. Unremarkable urinary bladder. Stomach/Bowel: Stomach is within normal limits. Appendix appears normal. No evidence of bowel wall thickening, distention, or inflammatory changes. Diverticulosis noted of the descending and sigmoid. Vascular/Lymphatic: Aortic atherosclerosis. No enlarged  abdominal or pelvic lymph nodes. Reproductive: Prostate is unremarkable. Other: No abdominal wall hernia or abnormality. No abdominopelvic ascites. Musculoskeletal: No acute or significant osseous findings. IMPRESSION: 1. Left-sided hydronephrosis and hydroureter suggesting recent passage of a stone or pyelonephritis. 2. No nephrolithiasis identified currently. 3. Diverticulosis. Electronically Signed   By: Sammie Bench M.D.   On: 06/05/2022 16:19    Microbiology: Recent Results (from the past 240 hour(s))  Urine Culture     Status: Abnormal (Preliminary result)   Collection Time: 06/05/22  1:15 PM   Specimen: Urine, Random  Result Value Ref Range Status   Specimen Description   Final    URINE, RANDOM Performed at [facility], [address], [place], [place] [address]    Special Requests   Final    NONE Performed at [facility], [address] [place], [place] [address]    Culture >=100,000 COLONIES/mL ESCHERICHIA COLI (A)  Final   Report Status PENDING  Incomplete  Blood culture (routine x 2)     Status: None (Preliminary result)   Collection Time: 06/05/22  5:46 PM   Specimen: BLOOD  Result Value Ref Range Status   Specimen Description BLOOD LEFT AC  Final   Special Requests   Final    BOTTLES DRAWN AEROBIC AND ANAEROBIC Blood Culture adequate volume   Culture   Final    NO GROWTH 2 DAYS Performed at [facility], [address] [place], [place] [address]    Report Status PENDING  Incomplete  Blood culture (routine x 2)     Status: None (Preliminary result)   Collection Time: 06/05/22  5:48 PM   Specimen: BLOOD  Result Value Ref Range Status   Specimen Description BLOOD LEFT AC  Final   Special Requests   Final    BOTTLES DRAWN AEROBIC AND ANAEROBIC Blood Culture results may not be optimal due to an inadequate volume of blood received in culture bottles   Culture   Final    NO GROWTH 2 DAYS Performed at [facility], [address] [place],  [address]    Report Status PENDING  Incomplete     Labs: CBC: Recent Labs  Lab 06/05/22 1225 06/06/22 0510 06/07/22 0420  WBC 16.7* 13.4* 9.7  HGB 11.6* 9.8* 9.7*  HCT 33.2* 28.1* 27.6*  MCV 84.5 86.2 84.4  PLT 235 190 [id number]   Basic Metabolic Panel: Recent Labs  Lab 06/05/22 1225 06/06/22 0510 06/07/22 0420  NA 131* 129* 131*  K 3.8 3.8 3.3*  CL 104 101 103  CO2 18* 20* 20*  GLUCOSE 181* 128* 88  BUN 17 16 16  $ CREATININE 0.90 0.91 0.79  CALCIUM 8.9 7.6* 7.7*  MG  --  1.4* 2.2  PHOS  --  2.6 2.3*  Liver Function Tests: No results for input(s): "AST", "ALT", "ALKPHOS", "BILITOT", "PROT", "ALBUMIN" in the last 168 hours. No results for input(s): "LIPASE", "AMYLASE" in the last 168 hours. No results for input(s): "AMMONIA" in the last 168 hours. Cardiac Enzymes: No results for input(s): "CKTOTAL", "CKMB", "CKMBINDEX", "TROPONINI" in the last 168 hours. BNP (last 3 results) No results for input(s): "BNP" in the last 8760 hours. CBG: Recent Labs  Lab 06/06/22 1136 06/06/22 1651 06/06/22 2038 06/07/22 0739 06/07/22 1252  GLUCAP 146* 110* 133* 84 165*    Time spent: 35 minutes  Signed:  Val Riles  [facility] 06/07/2022 3:09 PM

## 2022-06-07 NOTE — Progress Notes (Addendum)
CCMD called and said that patient had SVT HR up to 150's not sustaining. This RN checked on patient. Patient stated that she walked to the bathroom and that she feels ok. Notified Dr. Dwyane Dee of the above. Per MD he will discontinue the discharge and monitor on tele overnight.

## 2022-06-08 ENCOUNTER — Inpatient Hospital Stay (HOSPITAL_COMMUNITY)
Admit: 2022-06-08 | Discharge: 2022-06-08 | Disposition: A | Discharge status: Home / Self Care | Payer: Medicare Other | Attending: Student | Admitting: Student

## 2022-06-08 DIAGNOSIS — I471 Supraventricular tachycardia, unspecified: Secondary | ICD-10-CM

## 2022-06-08 DIAGNOSIS — R9431 Abnormal electrocardiogram [ECG] [EKG]: Secondary | ICD-10-CM

## 2022-06-08 LAB — CBC
HCT: 27.6 % — ABNORMAL LOW (ref 36.0–46.0)
Hemoglobin: 9.7 g/dL — ABNORMAL LOW (ref 12.0–15.0)
MCH: 29.2 pg (ref 26.0–34.0)
MCHC: 35.1 g/dL (ref 30.0–36.0)
MCV: 83.1 fL (ref 80.0–100.0)
Platelets: 231 10*3/uL (ref 150–400)
RBC: 3.32 MIL/uL — ABNORMAL LOW (ref 3.87–5.11)
RDW: 12.4 % (ref 11.5–15.5)
WBC: 6.3 10*3/uL (ref 4.0–10.5)
nRBC: 0 % (ref 0.0–0.2)

## 2022-06-08 LAB — MAGNESIUM: Magnesium: 1.8 mg/dL (ref 1.7–2.4)

## 2022-06-08 LAB — BASIC METABOLIC PANEL
Anion gap: 6 (ref 5–15)
BUN: 17 mg/dL (ref 8–23)
CO2: 23 mmol/L (ref 22–32)
Calcium: 8.2 mg/dL — ABNORMAL LOW (ref 8.9–10.3)
Chloride: 104 mmol/L (ref 98–111)
Creatinine, Ser: 0.77 mg/dL (ref 0.44–1.00)
GFR, Estimated: >60 mL/min (ref >60)
Glucose, Bld: 141 mg/dL — ABNORMAL HIGH (ref 70–99)
Potassium: 3.6 mmol/L (ref 3.5–5.1)
Sodium: 133 mmol/L — ABNORMAL LOW (ref 135–145)

## 2022-06-08 LAB — PHOSPHORUS: Phosphorus: 2.6 mg/dL (ref 2.5–4.6)

## 2022-06-08 LAB — ECHOCARDIOGRAM COMPLETE
AR max vel: 1.67 cm2
AV Area VTI: 1.72 cm2
AV Area mean vel: 1.69 cm2
AV Mean grad: 4.5 mmHg
AV Peak grad: 8.2 mmHg
Ao pk vel: 1.44 m/s
Area-P 1/2: 4.68 cm2
Calc EF: 66.4 %
Height: 62 in
P 1/2 time: 724 msec
S' Lateral: 2.7 cm
Single Plane A2C EF: 70.5 %
Single Plane A4C EF: 61.5 %
Weight: 2352 oz

## 2022-06-08 LAB — GLUCOSE, CAPILLARY
Glucose-Capillary: 205 mg/dL — ABNORMAL HIGH (ref 70–99)
Glucose-Capillary: 123 mg/dL — ABNORMAL HIGH (ref 70–99)

## 2022-06-08 LAB — URINE CULTURE: Culture: >=100000 — AB

## 2022-06-08 LAB — TROPONIN I (HIGH SENSITIVITY)
Troponin I (High Sensitivity): 8 ng/L (ref <18)
Troponin I (High Sensitivity): 8 ng/L (ref <18)

## 2022-06-08 MED ORDER — POTASSIUM CHLORIDE CRYS ER 20 MEQ PO TBCR
40.0000 meq | EXTENDED_RELEASE_TABLET | Freq: Once | ORAL | Status: AC
  Administered 2022-06-08: 40 meq via ORAL
  Filled 2022-06-08: qty 2

## 2022-06-08 MED ORDER — NITROFURANTOIN MONOHYD MACRO 100 MG PO CAPS: 100.0000 mg | ORAL_CAPSULE | Freq: Two times a day (BID) | ORAL | 0 refills | Status: AC

## 2022-06-08 MED ORDER — MAGNESIUM SULFATE 2 GM/50ML IV SOLN
2.0000 g | Freq: Once | INTRAVENOUS | Status: AC
  Administered 2022-06-08: 2 g via INTRAVENOUS
  Filled 2022-06-08: qty 50

## 2022-06-08 NOTE — Plan of Care (Signed)
  Problem: Education: Goal: Ability to describe self-care measures that may prevent or decrease complications (Diabetes Survival Skills Education) will improve Outcome: Progressing Goal: Individualized Educational Video(s) Outcome: Progressing   Problem: Coping: Goal: Ability to adjust to condition or change in health will improve Outcome: Progressing   

## 2022-06-08 NOTE — Progress Notes (Signed)
[facility] Progress Note  Patient: Andrea Dodson    [id number]  DOA: 06/05/2022     Date of Service: the patient was seen and examined on 06/08/2022  Chief Complaint  Patient presents with   Flank Pain   Brief hospital course:  Burke Heise is a 72 y.o. female with Past medical history of NIDDM T2, HTN, HLD, renal stone, presented to [facility] ED with complaining of left flank and lower abdominal pain and dysuria.  According to patient she was not feeling well for past 3 to 4 days, feeling weak and nauseated.  Today morning around 2 AM she woke up with pain in the abdomen.  In the ED patient had fever Tmax 103.   ED Course:  WBC 16.7 elevated, lactic acid 2.5 slightly elevated.  UA positive for UTI CT stone: Left-sided hydronephrosis and hydroureter suggesting recent passage of a stone or pyelonephritis. 2. No nephrolithiasis identified currently. 3. Diverticulosis. Hyponatremia sodium 131, CO2 18 mild metabolic acidosis, hyperglycemia serum glucose 181. [facility] hospitalist consulted for admission and further management    Assessment and Plan:  # Acute left-sided pyelonephritis with hydroureter CT stone:Left-sided hydronephrosis and hydroureter suggesting recent passage of a stone or pyelonephritis. UA positive Started ceftriaxone 1 g IV daily Follow urine culture Follow blood culture    # SVT Patient had an episode of SVT heart rate 150s, patient remained asymptomatic We will continue to monitor on telemetry Monitor electrolytes  # HTN, HLD Resumed home meds aspirin, Crestor, losartan 50 mg p.o. daily Monitor BP and titrate medication accordingly     # NIDDM T2, HbA1c 6.9, well-controlled Held home medications for now Started NovoLog sliding scale Follow A1c Monitor CBG, continue diabetic diet   Isotonic hyponatremia serum osmolality 280 WNL Na 131--129 Continue to monitor sodium level  Hypomagnesemia, mag repleted. Monitor electrolytes and replete as  needed.   Body mass index is 26.89 kg/m.  Interventions:   Diet: Diabetic diet DVT Prophylaxis: Subcutaneous Lovenox   Advance goals of care discussion: Full code  Family Communication: family was not present at bedside, at the time of interview.  The pt provided permission to discuss medical plan with the family. Opportunity was given to ask question and all questions were answered satisfactorily.   Disposition:  Pt is from Home, admitted with pyelonephritis, blood culture and urine culture still pending, which precludes a safe discharge. Discharge to home, when clinically stable, may need 1-2 more days to improve..  Subjective: No significant overnight events, no significant pain in the abdomen, no nausea vomiting or diarrhea.  No chest pain or palpitation, no shortness of breath. Urine culture sent to report is pending.  Patient developed an episode of SVT as per RN, heart rate was in 150s but patient remained asymptomatic. Plan was to discharge home today but canceled due to SVT episode.   Physical Exam: General: NAD, lying comfortably Appear in no distress, affect appropriate Eyes: PERRLA ENT: Oral Mucosa Clear, moist  Neck: no JVD,  Cardiovascular: S1 and S2 Present, no Murmur,  Respiratory: good respiratory effort, Bilateral Air entry equal and Decreased, no Crackles, no wheezes Abdomen: Bowel Sound present, Soft and no tenderness,  Skin: no rashes Extremities: no Pedal edema, no calf tenderness Neurologic: without any new focal findings Gait not checked due to patient safety concerns  Vitals:   06/07/22 0737 06/07/22 1541 06/07/22 2024 06/08/22 0341  BP: (!) 129/58 (!) 161/56 139/81 (!) 134/58  Pulse: 64 74 81 83  Resp: 16 18 18 $ 16  Temp: 98 F (36.7 C) 98 F (36.7 C) 98.6 F (37 C) 98.2 F (36.8 C)  TempSrc:   Oral Oral  SpO2: 99% 97% 95% 93%  Weight:      Height:        Intake/Output Summary (Last 24 hours) at 06/08/2022 0732 Last data filed at  06/07/2022 1616 Gross per 24 hour  Intake 300 ml  Output --  Net 300 ml   Filed Weights   06/05/22 1258  Weight: 66.7 kg    Data Reviewed: I have personally reviewed and interpreted daily labs, tele strips, imagings as discussed above. I reviewed all nursing notes, pharmacy notes, vitals, pertinent old records I have discussed plan of care as described above with RN and patient/family.  CBC: Recent Labs  Lab 06/05/22 1225 06/06/22 0510 06/07/22 0420 06/08/22 0454  WBC 16.7* 13.4* 9.7 6.3  HGB 11.6* 9.8* 9.7* 9.7*  HCT 33.2* 28.1* 27.6* 27.6*  MCV 84.5 86.2 84.4 83.1  PLT 235 190 198 [id number]   Basic Metabolic Panel: Recent Labs  Lab 06/05/22 1225 06/06/22 0510 06/07/22 0420 06/08/22 0454  NA 131* 129* 131* 133*  K 3.8 3.8 3.3* 3.6  CL 104 101 103 104  CO2 18* 20* 20* 23  GLUCOSE 181* 128* 88 141*  BUN 17 16 16 17  $ CREATININE 0.90 0.91 0.79 0.77  CALCIUM 8.9 7.6* 7.7* 8.2*  MG  --  1.4* 2.2 1.8  PHOS  --  2.6 2.3* 2.6    Studies: No results found.  Scheduled Meds:  aspirin EC  81 mg Oral Daily   enoxaparin (LOVENOX) injection  40 mg Subcutaneous Q24H   fluticasone  2 spray Each Nare Daily   insulin aspart  0-15 Units Subcutaneous TID WC   loratadine  10 mg Oral QHS   losartan  50 mg Oral Daily   rosuvastatin  10 mg Oral Daily   sodium bicarbonate  650 mg Oral TID   sodium chloride flush  3 mL Intravenous Q12H   Continuous Infusions:  sodium chloride     cefTRIAXone (ROCEPHIN)  IV Stopped (06/07/22 1848)   PRN Meds: sodium chloride, acetaminophen **OR** acetaminophen, bisacodyl, guaiFENesin-dextromethorphan, HYDROcodone-acetaminophen, ondansetron **OR** ondansetron (ZOFRAN) IV, phenazopyridine, polyethylene glycol, sodium chloride flush  Time spent: 35 minutes  Author: Val Riles. MD [facility] 06/08/2022 7:32 AM  To reach On-call, see care teams to locate the attending and reach out to them via [email or web address]. If 7PM-7AM, please contact  night-coverage If you still have difficulty reaching the attending provider, please page the [facility] (Director on Call) for [facility] on amion for assistance.

## 2022-06-08 NOTE — Plan of Care (Signed)
Patient is appropriate for discharge from this facility to independent living environment.  PIV removed and discharge plan reviewed with patient with patient reports understanding.

## 2022-06-09 ENCOUNTER — Telehealth: Payer: Self-pay | Admitting: *Deleted

## 2022-06-09 NOTE — Transitions of Care (Post Inpatient/ED Visit) (Signed)
   06/09/2022  Name: Zaire Otano MRN: [id number] DOB: May 08, 1950  Today's TOC FU Call Status: Today's TOC FU Call Status:: Successful TOC FU Call Competed TOC FU Call Complete Date: 06/09/22  Transition Care Management Follow-up Telephone Call    Items Reviewed: Did you receive and understand the discharge instructions provided?: Yes Medications obtained and verified?: Yes (Medications Reviewed) Any new allergies since your discharge?: No Dietary orders reviewed?: Yes Type of Diet Ordered:: carb modified Do you have support at home?: Yes People in Home: sibling(s) Name of Support/Comfort Primary Source: [name], son and daughter  Home Care and Equipment/Supplies: [place] Ordered?: No Any new equipment or medical supplies ordered?: No  Functional Questionnaire: Do you need assistance with bathing/showering or dressing?: No Do you need assistance with meal preparation?: No Do you need assistance with eating?: No Do you have difficulty maintaining continence: No Do you need assistance with getting out of bed/getting out of a chair/moving?: No Do you have difficulty managing or taking your medications?: No  Folllow up appointments reviewed: PCP Follow-up appointment confirmed?: Yes Date of PCP follow-up appointment?: 06/10/22 Follow-up Provider: [facility] Follow-up appointment confirmed?: NA Do you need transportation to your follow-up appointment?: No Do you understand care options if your condition(s) worsen?: Yes-patient verbalized understanding   Interventions Today    Flowsheet Row Most Recent Value  Nutrition Interventions   Nutrition Discussed/Reviewed Nutrition Discussed  [Patient will follow up with [facility] for 14 day meal plan to start.]       [place] [facility] [phone]

## 2022-06-10 ENCOUNTER — Ambulatory Visit (INDEPENDENT_AMBULATORY_CARE_PROVIDER_SITE_OTHER): Payer: Medicare Other | Admitting: Family Medicine

## 2022-06-10 ENCOUNTER — Encounter: Payer: Self-pay | Admitting: Family Medicine

## 2022-06-10 VITALS — BP 134/70 | HR 68 | Ht 61.0 in | Wt 150.0 lb

## 2022-06-10 DIAGNOSIS — N1330 Unspecified hydronephrosis: Secondary | ICD-10-CM

## 2022-06-10 DIAGNOSIS — N12 Tubulo-interstitial nephritis, not specified as acute or chronic: Secondary | ICD-10-CM

## 2022-06-10 LAB — CULTURE, BLOOD (ROUTINE X 2)
Culture: NO GROWTH
Culture: NO GROWTH
Special Requests: ADEQUATE

## 2022-06-10 MED ORDER — CEPHALEXIN 500 MG PO CAPS: 500.0000 mg | ORAL_CAPSULE | Freq: Three times a day (TID) | ORAL | 0 refills | Status: DC

## 2022-06-10 NOTE — Progress Notes (Signed)
Subjective:    Patient ID: Andrea Dodson, female    DOB: [birth year], 72 y.o.   MRN: [id number]  Andrea Dodson is a 72 y.o. female presenting on 06/10/2022 for Hospitalization Follow-up   HPI  HOSPITAL FOLLOW-UP VISIT  Hospital/Location: [facility] Date of Admission: 06/05/22 Date of Discharge: 06/08/22 Transitions of care telephone call: 06/09/22 phone call by Johny Shock RN  Reason for Admission: Pyelonephritis  [facility] H&P and Discharge Summary have been reviewed - Patient presents today 2 days after recent hospitalization. Brief summary of recent course, patient had symptoms of L flank pain and UTI Pyelonephritis, history of recurrent UTI and Kidney stones, hospitalized, treated with urine culture identified E Coli, treated with Ceftriaxone IV antibiotic, and discharged on Macrobid, twice a day to complete 14 day total course.  No longer has dysuria. That has improved. Urine color is still dark, not very dark but darker than normal.  Sinusitis Sinus drainage and cough Diabetic tussin OTC Flonase  - Today reports overall has done well after discharge. Symptoms of dysuria has resolved. Has some darker urine still  - New medications on discharge: Macrobid - Changes to current meds on discharge: none  I have reviewed the discharge medication list, and have reconciled the current and discharge medications today.   Current Outpatient Medications:    aspirin EC 81 MG tablet, Take 1 tablet (81 mg total) by mouth daily., Disp: , Rfl:    Blood Glucose Monitoring Suppl (ONE TOUCH ULTRA 2) w/Device KIT, 1 device for checking blood sugar once daily, Disp: 1 each, Rfl: 0   cephALEXin (KEFLEX) 500 MG capsule, Take 1 capsule (500 mg total) by mouth 3 (three) times daily. For 7 days, Disp: 21 capsule, Rfl: 0   fluticasone (FLONASE) 50 MCG/ACT nasal spray, Place 2 sprays into both nostrils daily. Use for 4-6 weeks then stop and use seasonally or as needed., Disp: 16 g, Rfl: 3   glucose  blood (ONE TOUCH ULTRA TEST) test strip, CHECK BLOOD SUGAR ONCE DAILY, Disp: 100 each, Rfl: 3   Lancets (ONETOUCH ULTRASOFT) lancets, Use as instructed, Disp: 100 each, Rfl: 12   loratadine (CLARITIN) 10 MG tablet, Take 1 tablet (10 mg total) by mouth daily., Disp: 30 tablet, Rfl: 11   losartan (COZAAR) 50 MG tablet, Take 1 tablet (50 mg total) by mouth daily., Disp: 90 tablet, Rfl: 3   metFORMIN (GLUCOPHAGE) 500 MG tablet, TAKE 1 TABLET BY MOUTH TWICE  DAILY WITH MEALS, Disp: 200 tablet, Rfl: 0   nitrofurantoin, macrocrystal-monohydrate, (MACROBID) 100 MG capsule, Take 1 capsule (100 mg total) by mouth 2 (two) times daily for 12 days., Disp: 24 capsule, Rfl: 0   rosuvastatin (CRESTOR) 10 MG tablet, TAKE 1 TABLET BY MOUTH DAILY, Disp: 100 tablet, Rfl: 0   naproxen (NAPROSYN) 500 MG tablet, Take 1 tablet (500 mg total) by mouth 2 (two) times daily with a meal. (Patient not taking: Reported on 06/10/2022), Disp: 20 tablet, Rfl: 2   phenazopyridine (PYRIDIUM) 200 MG tablet, Take 1 tablet (200 mg total) by mouth 3 (three) times daily as needed for pain. (Patient not taking: Reported on 06/05/2022), Disp: 20 tablet, Rfl: 0   RYALTRIS 665-25 MCG/ACT SUSP, Place 1 spray into both nostrils 2 (two) times daily. (Patient not taking: Reported on 06/05/2022), Disp: , Rfl:   ------------------------------------------------------------------------- Social History   Tobacco Use   Smoking status: Never    Passive exposure: Never   Smokeless tobacco: Never  Vaping Use   Vaping Use: Never used  Substance Use Topics   Alcohol use: No   Drug use: No    Review of Systems Per HPI unless specifically indicated above     Objective:    BP 134/70   Pulse 68   Ht 5' 1"$  (1.549 m)   Wt 150 lb (68 kg)   SpO2 98%   BMI 28.34 kg/m   Wt Readings from Last 3 Encounters:  06/10/22 150 lb (68 kg)  06/05/22 147 lb (66.7 kg)  02/26/22 151 lb 6.4 oz (68.7 kg)    Physical Exam Vitals and nursing note reviewed.   Constitutional:      General: She is not in acute distress.    Appearance: Normal appearance. She is well-developed. She is not diaphoretic.     Comments: Well-appearing, comfortable, cooperative  HENT:     Head: Normocephalic and atraumatic.  Eyes:     General:        Right eye: No discharge.        Left eye: No discharge.     Conjunctiva/sclera: Conjunctivae normal.  Cardiovascular:     Rate and Rhythm: Normal rate.  Pulmonary:     Effort: Pulmonary effort is normal.  Skin:    General: Skin is warm and dry.     Findings: No erythema or rash.  Neurological:     Mental Status: She is alert and oriented to person, place, and time.  Psychiatric:        Mood and Affect: Mood normal.        Behavior: Behavior normal.        Thought Content: Thought content normal.     Comments: Well groomed, good eye contact, normal speech and thoughts     I have personally reviewed the radiology report from 06/08/22 on ECHO.   ECHOCARDIOGRAM REPORT       Patient Name:   Andrea Dodson Date of Exam: 06/08/2022  Medical Rec #:  [id number]      Height:       62.0 in  Accession #:    [id number]     Weight:       147.0 lb  Date of Birth:  March 28, 1951     BSA:          1.677 m  Patient Age:    67 years       BP:           131/58 mmHg  Patient Gender: F              HR:           83 bpm.  Exam Location:  [facility]   Procedure: 2D Echo, Color Doppler and Cardiac Doppler   Indications:     Abnormal ECG R94.31    History:         Patient has no prior history of Echocardiogram  examinations.                  Risk Factors:Diabetes and Hypertension.    Sonographer:     L. Thornton-Maynard  Referring Phys:  [id number] Val Riles  Diagnosing Phys: Kate Sable MD   IMPRESSIONS     1. Left ventricular ejection fraction, by estimation, is 55 to 60%. The  left ventricle has normal function. The left ventricle has no regional  wall motion abnormalities. Left ventricular diastolic parameters are   consistent with Grade I diastolic  dysfunction (impaired relaxation).   2. Right ventricular systolic function is normal. The right ventricular  size is normal.   3. The mitral valve is normal in structure. No evidence of mitral valve  regurgitation.   4. The aortic valve is tricuspid. Aortic valve regurgitation is not  visualized.   FINDINGS   Left Ventricle: Left ventricular ejection fraction, by estimation, is 55  to 60%. The left ventricle has normal function. The left ventricle has no  regional wall motion abnormalities. The left ventricular internal cavity  size was normal in size. There is   no left ventricular hypertrophy. Left ventricular diastolic parameters  are consistent with Grade I diastolic dysfunction (impaired relaxation).   Right Ventricle: The right ventricular size is normal. No increase in  right ventricular wall thickness. Right ventricular systolic function is  normal.   Left Atrium: Left atrial size was normal in size.   Right Atrium: Right atrial size was normal in size.   Pericardium: There is no evidence of pericardial effusion.   Mitral Valve: The mitral valve is normal in structure. No evidence of  mitral valve regurgitation.   Tricuspid Valve: The tricuspid valve is normal in structure. Tricuspid  valve regurgitation is not demonstrated.   Aortic Valve: The aortic valve is tricuspid. Aortic valve regurgitation is  not visualized. Aortic regurgitation PHT measures 724 msec. Aortic valve  mean gradient measures 4.5 mmHg. Aortic valve peak gradient measures 8.2  mmHg. Aortic valve area, by VTI  measures 1.72 cm.   Pulmonic Valve: The pulmonic valve was normal in structure. Pulmonic valve  regurgitation is mild.   Aorta: The aortic root and ascending aorta are structurally normal, with  no evidence of dilitation.   IAS/Shunts: No atrial level shunt detected by color flow Doppler.     LEFT VENTRICLE  PLAX 2D  LVIDd:         3.10 cm      Diastology  LVIDs:         2.70 cm     LV e' medial:    8.27 cm/s  LV PW:         1.30 cm     LV E/e' medial:  9.7  LV IVS:        1.50 cm     LV e' lateral:   7.51 cm/s  LVOT diam:     1.90 cm     LV E/e' lateral: 10.7  LV SV:         53  LV SV Index:   32  LVOT Area:     2.84 cm    LV Volumes (MOD)  LV vol d, MOD A2C: 44.8 ml  LV vol d, MOD A4C: 49.3 ml  LV vol s, MOD A2C: 13.2 ml  LV vol s, MOD A4C: 19.0 ml  LV SV MOD A2C:     31.6 ml  LV SV MOD A4C:     49.3 ml  LV SV MOD BP:      32.2 ml   RIGHT VENTRICLE             IVC  RV S prime:     14.20 cm/s  IVC diam: 1.40 cm  TAPSE (M-mode): 1.8 cm   LEFT ATRIUM             Index        RIGHT ATRIUM           Index  LA diam:        2.40 cm 1.43 cm/m   RA Area:     12.40 cm  LA Vol (A2C):   21.3 ml 12.70 ml/m  RA Volume:   30.60 ml  18.24 ml/m  LA Vol (A4C):   20.8 ml 12.40 ml/m  LA Biplane Vol: 20.9 ml 12.46 ml/m   AORTIC VALVE                    PULMONIC VALVE  AV Area (Vmax):    1.67 cm     PV Vmax:          0.95 m/s  AV Area (Vmean):   1.69 cm     PV Peak grad:     3.6 mmHg  AV Area (VTI):     1.72 cm     PR End Diast Vel: 4.10 msec  AV Vmax:           143.50 cm/s  AV Vmean:          99.250 cm/s  AV VTI:            0.308 m  AV Peak Grad:      8.2 mmHg  AV Mean Grad:      4.5 mmHg  LVOT Vmax:         84.40 cm/s  LVOT Vmean:        59.200 cm/s  LVOT VTI:          0.187 m  LVOT/AV VTI ratio: 0.61  AI PHT:            724 msec    AORTA  Ao Root diam: 3.40 cm  Ao Asc diam:  3.40 cm   MITRAL VALVE  MV Area (PHT): 4.68 cm    SHUNTS  MV Decel Time: 162 msec    Systemic VTI:  0.19 m  MV E velocity: 80.20 cm/s  Systemic Diam: 1.90 cm  MV A velocity: 96.90 cm/s  MV E/A ratio:  0.83   Kate Sable MD  Electronically signed by Kate Sable MD  Signature Date/Time: 06/08/2022/11:52:06 AM    Results for orders placed or performed during the hospital encounter of 06/05/22  Blood culture (routine x 2)    Specimen: BLOOD  Result Value Ref Range   Specimen Description BLOOD LEFT AC    Special Requests      BOTTLES DRAWN AEROBIC AND ANAEROBIC Blood Culture results may not be optimal due to an inadequate volume of blood received in culture bottles   Culture      NO GROWTH 5 DAYS Performed at [facility], [address] [place], [place] [address]    Report Status 06/10/2022 FINAL   Blood culture (routine x 2)   Specimen: BLOOD  Result Value Ref Range   Specimen Description BLOOD LEFT AC    Special Requests      BOTTLES DRAWN AEROBIC AND ANAEROBIC Blood Culture adequate volume   Culture      NO GROWTH 5 DAYS Performed at [facility], [address], [place], [place] [address]    Report Status 06/10/2022 FINAL   Urine Culture   Specimen: Urine, Random  Result Value Ref Range   Specimen Description      URINE, RANDOM Performed at [facility], [address] [place], [place] [address]    Special Requests      NONE Performed at [facility], [address] [place], [place] [address]    Culture >=100,000 COLONIES/mL ESCHERICHIA COLI (A)    Report Status 06/08/2022 FINAL    Organism ID, Bacteria ESCHERICHIA COLI (  A)       Susceptibility   Escherichia coli - MIC*    AMPICILLIN >=32 RESISTANT Resistant     CEFAZOLIN <=4 SENSITIVE Sensitive     CEFEPIME <=0.12 SENSITIVE Sensitive     CEFTRIAXONE <=0.25 SENSITIVE Sensitive     CIPROFLOXACIN 0.5 INTERMEDIATE Intermediate     GENTAMICIN <=1 SENSITIVE Sensitive     IMIPENEM <=0.25 SENSITIVE Sensitive     NITROFURANTOIN <=16 SENSITIVE Sensitive     TRIMETH/SULFA >=320 RESISTANT Resistant     AMPICILLIN/SULBACTAM 16 INTERMEDIATE Intermediate     PIP/TAZO <=4 SENSITIVE Sensitive     * >=100,000 COLONIES/mL ESCHERICHIA COLI  Basic metabolic panel  Result Value Ref Range   Sodium 131 (L) 135 - 145 mmol/L   Potassium 3.8 3.5 - 5.1 mmol/L   Chloride 104 98 - 111 mmol/L   CO2 18 (L) 22 - 32  mmol/L   Glucose, Bld 181 (H) 70 - 99 mg/dL   BUN 17 8 - 23 mg/dL   Creatinine, Ser 0.90 0.44 - 1.00 mg/dL   Calcium 8.9 8.9 - 10.3 mg/dL   GFR, Estimated >60 >60 mL/min   Anion gap 9 5 - 15  CBC  Result Value Ref Range   WBC 16.7 (H) 4.0 - 10.5 K/uL   RBC 3.93 3.87 - 5.11 MIL/uL   Hemoglobin 11.6 (L) 12.0 - 15.0 g/dL   HCT 33.2 (L) 36.0 - 46.0 %   MCV 84.5 80.0 - 100.0 fL   MCH 29.5 26.0 - 34.0 pg   MCHC 34.9 30.0 - 36.0 g/dL   RDW 12.5 11.5 - 15.5 %   Platelets 235 150 - 400 K/uL   nRBC 0.0 0.0 - 0.2 %  Urinalysis, Routine w reflex microscopic -Urine, Clean Catch  Result Value Ref Range   Color, Urine YELLOW (A) YELLOW   APPearance TURBID (A) CLEAR   Specific Gravity, Urine 1.014 1.005 - 1.030   pH 5.0 5.0 - 8.0   Glucose, UA NEGATIVE NEGATIVE mg/dL   Hgb urine dipstick MODERATE (A) NEGATIVE   Bilirubin Urine NEGATIVE NEGATIVE   Ketones, ur NEGATIVE NEGATIVE mg/dL   Protein, ur 100 (A) NEGATIVE mg/dL   Nitrite POSITIVE (A) NEGATIVE   Leukocytes,Ua LARGE (A) NEGATIVE   RBC / HPF 21-50 0 - 5 RBC/hpf   WBC, UA >50 0 - 5 WBC/hpf   Bacteria, UA MANY (A) NONE SEEN   Squamous Epithelial / HPF 0-5 0 - 5 /HPF   WBC Clumps PRESENT    Mucus PRESENT   Lactic acid, plasma  Result Value Ref Range   Lactic Acid, Venous 2.5 (HH) 0.5 - 1.9 mmol/L  Lactic acid, plasma  Result Value Ref Range   Lactic Acid, Venous 1.0 0.5 - 1.9 mmol/L  Basic metabolic panel  Result Value Ref Range   Sodium 129 (L) 135 - 145 mmol/L   Potassium 3.8 3.5 - 5.1 mmol/L   Chloride 101 98 - 111 mmol/L   CO2 20 (L) 22 - 32 mmol/L   Glucose, Bld 128 (H) 70 - 99 mg/dL   BUN 16 8 - 23 mg/dL   Creatinine, Ser 0.91 0.44 - 1.00 mg/dL   Calcium 7.6 (L) 8.9 - 10.3 mg/dL   GFR, Estimated >60 >60 mL/min   Anion gap 8 5 - 15  CBC  Result Value Ref Range   WBC 13.4 (H) 4.0 - 10.5 K/uL   RBC 3.26 (L) 3.87 - 5.11 MIL/uL   Hemoglobin 9.8 (L) 12.0 - 15.0  g/dL   HCT 28.1 (L) 36.0 - 46.0 %   MCV 86.2 80.0 - 100.0  fL   MCH 30.1 26.0 - 34.0 pg   MCHC 34.9 30.0 - 36.0 g/dL   RDW 12.8 11.5 - 15.5 %   Platelets 190 150 - 400 K/uL   nRBC 0.0 0.0 - 0.2 %  Magnesium  Result Value Ref Range   Magnesium 1.4 (L) 1.7 - 2.4 mg/dL  Phosphorus  Result Value Ref Range   Phosphorus 2.6 2.5 - 4.6 mg/dL  Hemoglobin A1c  Result Value Ref Range   Hgb A1c MFr Bld 6.9 (H) 4.8 - 5.6 %   Mean Plasma Glucose 151.33 mg/dL  Osmolality  Result Value Ref Range   Osmolality 280 275 - 295 mOsm/kg  Glucose, capillary  Result Value Ref Range   Glucose-Capillary 110 (H) 70 - 99 mg/dL  Basic metabolic panel  Result Value Ref Range   Sodium 131 (L) 135 - 145 mmol/L   Potassium 3.3 (L) 3.5 - 5.1 mmol/L   Chloride 103 98 - 111 mmol/L   CO2 20 (L) 22 - 32 mmol/L   Glucose, Bld 88 70 - 99 mg/dL   BUN 16 8 - 23 mg/dL   Creatinine, Ser 0.79 0.44 - 1.00 mg/dL   Calcium 7.7 (L) 8.9 - 10.3 mg/dL   GFR, Estimated >60 >60 mL/min   Anion gap 8 5 - 15  CBC  Result Value Ref Range   WBC 9.7 4.0 - 10.5 K/uL   RBC 3.27 (L) 3.87 - 5.11 MIL/uL   Hemoglobin 9.7 (L) 12.0 - 15.0 g/dL   HCT 27.6 (L) 36.0 - 46.0 %   MCV 84.4 80.0 - 100.0 fL   MCH 29.7 26.0 - 34.0 pg   MCHC 35.1 30.0 - 36.0 g/dL   RDW 12.6 11.5 - 15.5 %   Platelets 198 150 - 400 K/uL   nRBC 0.0 0.0 - 0.2 %  Magnesium  Result Value Ref Range   Magnesium 2.2 1.7 - 2.4 mg/dL  Phosphorus  Result Value Ref Range   Phosphorus 2.3 (L) 2.5 - 4.6 mg/dL  Glucose, capillary  Result Value Ref Range   Glucose-Capillary 133 (H) 70 - 99 mg/dL  Glucose, capillary  Result Value Ref Range   Glucose-Capillary 84 70 - 99 mg/dL  Glucose, capillary  Result Value Ref Range   Glucose-Capillary 165 (H) 70 - 99 mg/dL  Glucose, capillary  Result Value Ref Range   Glucose-Capillary 114 (H) 70 - 99 mg/dL  Basic metabolic panel  Result Value Ref Range   Sodium 133 (L) 135 - 145 mmol/L   Potassium 3.6 3.5 - 5.1 mmol/L   Chloride 104 98 - 111 mmol/L   CO2 23 22 - 32 mmol/L    Glucose, Bld 141 (H) 70 - 99 mg/dL   BUN 17 8 - 23 mg/dL   Creatinine, Ser 0.77 0.44 - 1.00 mg/dL   Calcium 8.2 (L) 8.9 - 10.3 mg/dL   GFR, Estimated >60 >60 mL/min   Anion gap 6 5 - 15  CBC  Result Value Ref Range   WBC 6.3 4.0 - 10.5 K/uL   RBC 3.32 (L) 3.87 - 5.11 MIL/uL   Hemoglobin 9.7 (L) 12.0 - 15.0 g/dL   HCT 27.6 (L) 36.0 - 46.0 %   MCV 83.1 80.0 - 100.0 fL   MCH 29.2 26.0 - 34.0 pg   MCHC 35.1 30.0 - 36.0 g/dL   RDW 12.4 11.5 - 15.5 %  Platelets 231 150 - 400 K/uL   nRBC 0.0 0.0 - 0.2 %  Magnesium  Result Value Ref Range   Magnesium 1.8 1.7 - 2.4 mg/dL  Phosphorus  Result Value Ref Range   Phosphorus 2.6 2.5 - 4.6 mg/dL  Glucose, capillary  Result Value Ref Range   Glucose-Capillary 159 (H) 70 - 99 mg/dL  Glucose, capillary  Result Value Ref Range   Glucose-Capillary 205 (H) 70 - 99 mg/dL  Glucose, capillary  Result Value Ref Range   Glucose-Capillary 123 (H) 70 - 99 mg/dL  CBG monitoring, ED  Result Value Ref Range   Glucose-Capillary 108 (H) 70 - 99 mg/dL  CBG monitoring, ED  Result Value Ref Range   Glucose-Capillary 146 (H) 70 - 99 mg/dL  ECHOCARDIOGRAM COMPLETE  Result Value Ref Range   Weight 2,352 oz   Height 62 in   BP 154/75 mmHg   Ao pk vel 1.44 m/s   AV Area VTI 1.72 cm2   AR max vel 1.67 cm2   AV Mean grad 4.5 mmHg   AV Peak grad 8.2 mmHg   Single Plane A2C EF 70.5 %   Single Plane A4C EF 61.5 %   Calc EF 66.4 %   S' Lateral 2.70 cm   AV Area mean vel 1.69 cm2   Area-P 1/2 4.68 cm2   P 1/2 time 724 msec   Est EF 55 - 60%   Troponin I (High Sensitivity)  Result Value Ref Range   Troponin I (High Sensitivity) 8 <18 ng/L  Troponin I (High Sensitivity)  Result Value Ref Range   Troponin I (High Sensitivity) 8 <18 ng/L      Assessment & Plan:   Problem List Items Addressed This Visit     Pyelonephritis - Primary   Relevant Medications   cephALEXin (KEFLEX) 500 MG capsule   Other Visit Diagnoses     Hydronephrosis of left  kidney           HFU Pyelonephritis - resolving L Hydronephrosis on prior CT  No clear stone or etiology, seems may have passed. History of nephrolithiasis.  Followed by [facility] Dr Diamantina Providence  I will contact Dr Diamantina Providence at [facility] and see if they can follow up sooner with you (sooner than July), and consider recurrent UTI treatment options or testing. May need a low dose preventative antibiotic.  On Macrobid, sensitive based on urine culture from 06/05/22 however, I am concerned this is not strong enough to resolve the UTI if Pyelonephritis was present.  If she is improving (currently the case) then can proceed and finish course.  Back up plan with Keflex antibiotic 541m 3 times daily for 7 days if not improved after 48 more hours on Macrobid antibiotic.  There are some resistances, so we have limited options.   For the cough / bronchitis Keep on Flonase spray 2 in each nostril once a day may take a few days to weeks to work. If not improving 48 hours we can consider Azithromycin Zpak   Meds ordered this encounter  Medications   cephALEXin (KEFLEX) 500 MG capsule    Sig: Take 1 capsule (500 mg total) by mouth 3 (three) times daily. For 7 days    Dispense:  21 capsule    Refill:  0    Follow up plan: Return if symptoms worsen or fail to improve.   ANobie Putnam [facility] 06/10/2022, 11:03 AM

## 2022-06-10 NOTE — Patient Instructions (Addendum)
Thank you for coming to the office today.  For the cough / bronchitis Keep on Flonase spray 2 in each nostril once a day may take a few days to weeks to work. If not improving 48 hours we can consider Azithromycin Zpak  ------------  I will contact Dr Diamantina Providence at [facility] and see if they can follow up sooner with you (sooner than July), and consider recurrent UTI treatment options or testing. May need a low dose preventative antibiotic.  Back up plan with Keflex antibiotic 522m 3 times daily for 7 days if not improved after 48 more hours on Macrobid antibiotic. There are some resistances, so we have limited options.  Urine Culture Order: [id number]: Final result     Visible to patient: Yes (not seen)     Next appt: 08/27/2022 at 01:20 PM in Internal Medicine ([name] DO)   Specimen Information: Urine, Random  0 Result Notes    Component 5 d ago  Specimen Description URINE, RANDOM [facility] [address], [place] [address] Special Requests NONE [facility] [address] [place] [address] Culture >=100,000 COLONIES/mL ESCHERICHIA COLI Abnormal   Report Status 06/08/2022 FINAL  Organism ID, Bacteria ESCHERICHIA COLI Abnormal   Resulting Agency [facility]     Susceptibility   Escherichia coli    MIC    AMPICILLIN >=32 RESIST... Resistant    AMPICILLIN/SULBACTAM 16 INTERMED... Intermediate    CEFAZOLIN <=4 SENSITIVE Sensitive    CEFEPIME <=0.12 SENS... Sensitive    CEFTRIAXONE <=0.25 SENS... Sensitive    CIPROFLOXACIN 0.5 INTERME... Intermediate    GENTAMICIN <=1 SENSITIVE Sensitive    IMIPENEM <=0.25 SENS... Sensitive    NITROFURANTOIN <=16 SENSIT... Sensitive    PIP/TAZO <=4 SENSITIVE Sensitive    TRIMETH/SULFA >=320 RESIS... Resistant           Susceptibility Comments  Escherichia coli  >=100,000 COLONIES/mL ESCHERICHIA COLI      Specimen Collected:  06/05/22 13:15 Last Resulted: 06/08/22 06:11          Please schedule a Follow-up Appointment to: Return if symptoms worsen or fail to improve.  If you have any other questions or concerns, please feel free to call the office or send a message through [facility] You may also schedule an earlier appointment if necessary.  Additionally, you may be receiving a survey about your experience at our office within a few days to 1 week by e-mail or mail. We value your feedback.  ANobie Putnam DO [facility]

## 2022-06-11 ENCOUNTER — Other Ambulatory Visit: Payer: Self-pay | Admitting: Family Medicine

## 2022-06-11 DIAGNOSIS — H6993 Unspecified Eustachian tube disorder, bilateral: Secondary | ICD-10-CM

## 2022-06-11 NOTE — Telephone Encounter (Signed)
Requested Prescriptions  Pending Prescriptions Disp Refills   fluticasone (FLONASE) 50 MCG/ACT nasal spray [Pharmacy Med Name: FLUTICASONE 50MCG NASAL SP (120) RX] 48 g 0    Sig: USE 2 SPRAYS IN EACH NOSTRIL DAILY. USE FOR 6-4 WEEKS THEN STOP AND USE SEASONALLY OR AS NEEDED     Ear, Nose, and Throat: Nasal Preparations - Corticosteroids Passed - 06/11/2022  5:06 PM      Passed - Valid encounter within last 12 months    Recent Outpatient Visits           Yesterday Pyelonephritis   [place], DO   3 months ago Annual physical exam   [facility] Olin Hauser, DO   8 months ago Dysfunction of both eustachian tubes   [facility] Olin Hauser, DO   10 months ago Type 2 diabetes mellitus with other specified complication, without long-term current use of insulin [facility])   [place], DO   1 year ago Encounter for [employer] annual wellness exam   [place], DO       Future Appointments             In 2 months [place], Devonne Doughty, [facility], [facility]

## 2022-06-13 ENCOUNTER — Encounter: Payer: Self-pay | Admitting: Family Medicine

## 2022-06-30 ENCOUNTER — Other Ambulatory Visit: Payer: Self-pay | Admitting: Family Medicine

## 2022-07-01 NOTE — Telephone Encounter (Signed)
Requested Prescriptions  Pending Prescriptions Disp Refills   metFORMIN (GLUCOPHAGE) 500 MG tablet [Pharmacy Med Name: metFORMIN HCl 500 MG Oral Tablet] 200 tablet 0    Sig: TAKE 1 TABLET BY MOUTH TWICE  DAILY WITH MEALS     Endocrinology:  Diabetes - Biguanides Failed - 06/30/2022 10:45 PM      Failed - B12 Level in normal range and within 720 days    No results found for: "VITAMINB12"       Passed - Cr in normal range and within 360 days    Creat  Date Value Ref Range Status  02/19/2022 0.84 0.60 - 1.00 mg/dL Final   Creatinine, Ser  Date Value Ref Range Status  06/08/2022 0.77 0.44 - 1.00 mg/dL Final   Creatinine, Urine  Date Value Ref Range Status  08/14/2021 98 20 - 275 mg/dL Final         Passed - HBA1C is between 0 and 7.9 and within 180 days    Hemoglobin A1C  Date Value Ref Range Status  08/06/2021 6.9  Final   Hgb A1c MFr Bld  Date Value Ref Range Status  06/05/2022 6.9 (H) 4.8 - 5.6 % Final    Comment:    (NOTE) Pre diabetes:          5.7%-6.4%  Diabetes:              >6.4%  Glycemic control for   <7.0% adults with diabetes          Passed - eGFR in normal range and within 360 days    GFR, Est African American  Date Value Ref Range Status  01/24/2020 84 > OR = 60 mL/min/1.33m Final   GFR, Est Non African American  Date Value Ref Range Status  01/24/2020 72 > OR = 60 mL/min/1.721mFinal   GFR, Estimated  Date Value Ref Range Status  06/08/2022 >60 >60 mL/min Final    Comment:    (NOTE) Calculated using the CKD-EPI Creatinine Equation (2021)    eGFR  Date Value Ref Range Status  02/19/2022 75 > OR = 60 mL/min/1.7333minal  02/17/2022 68 >59 mL/min/1.73 Final         Passed - Valid encounter within last 6 months    Recent Outpatient Visits           3 weeks ago Pyelonephritis   [facility]   4 months ago [facility]   9 months ago Dysfunction of both eustachian tubes   [facility] [facility] [place]   10 months ago Type 2 diabetes mellitus with other specified complication, without long-term current use of insulin ([facility]   1 year ago Encounter for [employer] wellness exam   [facility] [place]       Future Appointments             In 1 week [facility]illey CoD [facility] [place]   In 1 month [place]onne DoughtyO [facility] normal limits and completed in the last 12 months    WBC  Date Value Ref Range Status  06/08/2022 6.3  4.0 - 10.5 K/uL Final   RBC  Date Value Ref Range Status  06/08/2022 3.32 (L) 3.87 - 5.11 MIL/uL Final   Hemoglobin  Date Value Ref Range Status  06/08/2022 9.7 (L) 12.0 - 15.0 g/dL Final   HCT  Date Value Ref Range Status  06/08/2022 27.6 (L) 36.0 - 46.0 % Final   MCHC  Date Value Ref Range Status  06/08/2022 35.1 30.0 - 36.0 g/dL Final   [facility]  Date Value Ref Range Status  06/08/2022 29.2 26.0 - 34.0 pg Final   MCV  Date Value Ref Range Status  06/08/2022 83.1 80.0 - 100.0 fL Final   No results found for: "PLTCOUNTKUC", "LABPLAT", "POCPLA" RDW  Date Value Ref Range Status  06/08/2022 12.4 11.5 - 15.5 % Final

## 2022-07-09 ENCOUNTER — Telehealth: Payer: Self-pay | Admitting: Family Medicine

## 2022-07-09 NOTE — Telephone Encounter (Signed)
Contacted Andrea Dodson to schedule their annual wellness visit. Appointment made for 08/15/2022.  Sherol Dade; Care Guide Ambulatory Clinical [place] [facility] Direct Dial: [phone]

## 2022-07-14 ENCOUNTER — Encounter: Payer: Self-pay | Admitting: Urology

## 2022-07-14 ENCOUNTER — Ambulatory Visit
Admission: RE | Admit: 2022-07-14 | Discharge: 2022-07-14 | Disposition: A | Discharge status: Home / Self Care | Payer: Medicare Other | Attending: Urology | Admitting: Urology

## 2022-07-14 ENCOUNTER — Ambulatory Visit
Admission: RE | Admit: 2022-07-14 | Discharge: 2022-07-14 | Disposition: A | Discharge status: Home / Self Care | Payer: Medicare Other | Source: Ambulatory Visit | Attending: Urology | Admitting: Urology

## 2022-07-14 ENCOUNTER — Ambulatory Visit: Payer: Medicare Other | Admitting: Urology

## 2022-07-14 VITALS — BP 167/83 | HR 78 | Ht 61.0 in | Wt 148.0 lb

## 2022-07-14 DIAGNOSIS — R109 Unspecified abdominal pain: Secondary | ICD-10-CM | POA: Diagnosis not present

## 2022-07-14 DIAGNOSIS — N200 Calculus of kidney: Secondary | ICD-10-CM

## 2022-07-14 DIAGNOSIS — Z87440 Personal history of urinary (tract) infections: Secondary | ICD-10-CM

## 2022-07-14 DIAGNOSIS — Z87442 Personal history of urinary calculi: Secondary | ICD-10-CM

## 2022-07-14 DIAGNOSIS — N390 Urinary tract infection, site not specified: Secondary | ICD-10-CM

## 2022-07-14 DIAGNOSIS — K5900 Constipation, unspecified: Secondary | ICD-10-CM | POA: Diagnosis not present

## 2022-07-14 DIAGNOSIS — R300 Dysuria: Secondary | ICD-10-CM | POA: Diagnosis not present

## 2022-07-14 LAB — URINALYSIS, COMPLETE
Bilirubin, UA: NEGATIVE
Glucose, UA: NEGATIVE
Ketones, UA: NEGATIVE
Leukocytes,UA: NEGATIVE
Nitrite, UA: NEGATIVE
Protein,UA: NEGATIVE
Specific Gravity, UA: 1.02 (ref 1.005–1.030)
Urobilinogen, Ur: 0.2 mg/dL (ref 0.2–1.0)
pH, UA: 5 (ref 5.0–7.5)

## 2022-07-14 LAB — MICROSCOPIC EXAMINATION: Epithelial Cells (non renal): >10 /hpf — AB (ref 0–10)

## 2022-07-14 MED ORDER — ESTRADIOL 0.1 MG/GM VA CREA: TOPICAL_CREAM | VAGINAL | 12 refills | Status: DC

## 2022-07-14 NOTE — Progress Notes (Signed)
   07/14/2022 3:04 PM   Jerilynn Som 1950-07-19 [id number]  Reason for visit: History of nephrolithiasis, recent UTI/pyelonephritis and hydronephrosis  HPI: 72 year old female who previously has undergone ureteroscopy with both Dr. Junious Silk and Dr. Felipa Eth.  I have last saw her in July 2023 and recommended a [id number] urine metabolic workup, but this was never completed.  She was recently hospitalized in February 2024 for left-sided pyelonephritis.  I reviewed those hospital notes as well as the CT that shows no evidence of ureteral stones, mild to moderate left-sided hydro ureteronephrosis, small punctate nonobstructing renal stone x 1 bilaterally.  She improved with a course of antibiotics and denies any complaints today, specifically no flank pain or hematuria.  Urinalysis today is benign.  She reports about 3-4 UTIs a year and is interested in other prevention strategies.  We discussed the evaluation and treatment of patients with recurrent UTIs at length.  We specifically discussed the differences between asymptomatic bacteriuria and true urinary tract infection.  We discussed the AUA definition of recurrent UTI of at least 2 culture proven symptomatic acute cystitis episodes in a 57-month period, or 3 within a 1 year period.  We discussed the importance of culture directed antibiotic treatment, and antibiotic stewardship.  First-line therapy includes nitrofurantoin(5 days), Bactrim(3 days), or fosfomycin(3 g single dose).  Possible etiologies of recurrent infection include periurethral tissue atrophy in postmenopausal woman, constipation, sexual activity, incomplete emptying, anatomic abnormalities, and even genetic predisposition.  Finally, we discussed the role of perineal hygiene, timed voiding, adequate hydration, topical vaginal estrogen, cranberry prophylaxis, and low-dose antibiotic prophylaxis.  -Trial of topical estrogen cream for recurrent infections -Renal ultrasound to confirm  resolution of prior left hydronephrosis-> if residual left hydronephrosis would recommend retrograde pyelogram and diagnostic ureteroscopy -RTC 6 months symptom check   Billey Co, MD  [place] [address], [place], [place] [address] [phone]

## 2022-07-21 ENCOUNTER — Other Ambulatory Visit: Payer: Self-pay | Admitting: Family Medicine

## 2022-07-21 DIAGNOSIS — I10 Essential (primary) hypertension: Secondary | ICD-10-CM

## 2022-07-22 NOTE — Telephone Encounter (Signed)
Requested Prescriptions  Pending Prescriptions Disp Refills   losartan (COZAAR) 50 MG tablet [Pharmacy Med Name: Losartan Potassium 50 MG Oral Tablet] 90 tablet 0    Sig: TAKE 1 TABLET BY MOUTH DAILY     Cardiovascular:  Angiotensin Receptor Blockers Failed - 07/21/2022 10:46 PM      Failed - Last BP in normal range    BP Readings from Last 1 Encounters:  07/14/22 (!) 167/83         Passed - Cr in normal range and within 180 days    Creat  Date Value Ref Range Status  02/19/2022 0.84 0.60 - 1.00 mg/dL Final   Creatinine, Ser  Date Value Ref Range Status  06/08/2022 0.77 0.44 - 1.00 mg/dL Final   Creatinine, Urine  Date Value Ref Range Status  08/14/2021 98 20 - 275 mg/dL Final         Passed - K in normal range and within 180 days    Potassium  Date Value Ref Range Status  06/08/2022 3.6 3.5 - 5.1 mmol/L Final         Passed - Patient is not pregnant      Passed - Valid encounter within last 6 months    Recent Outpatient Visits           1 month ago Pyelonephritis   [place], DO   4 months ago Annual physical exam   [facility] Olin Hauser, DO   9 months ago Dysfunction of both eustachian tubes   [place], DO   11 months ago Type 2 diabetes mellitus with other specified complication, without long-term current use of insulin [facility])   [place], DO   1 year ago Encounter for [employer] annual wellness exam   [place], DO       Future Appointments             In 1 month [place], Devonne Doughty, [facility], [place]   In 5 months Diamantina [place], Herbert Seta, MD [place]

## 2022-07-25 ENCOUNTER — Ambulatory Visit
Admission: RE | Admit: 2022-07-25 | Discharge: 2022-07-25 | Disposition: A | Discharge status: Home / Self Care | Payer: Medicare Other | Source: Ambulatory Visit | Attending: Urology | Admitting: Urology

## 2022-07-25 DIAGNOSIS — N200 Calculus of kidney: Secondary | ICD-10-CM | POA: Diagnosis not present

## 2022-07-28 LAB — HEMOGLOBIN A1C: Hemoglobin A1C: 6.3

## 2022-07-30 ENCOUNTER — Ambulatory Visit
Admission: RE | Admit: 2022-07-30 | Discharge: 2022-07-30 | Disposition: A | Discharge status: Home / Self Care | Payer: Medicare Other | Source: Ambulatory Visit | Attending: Family Medicine | Admitting: Family Medicine

## 2022-07-30 DIAGNOSIS — Z1231 Encounter for screening mammogram for malignant neoplasm of breast: Secondary | ICD-10-CM | POA: Diagnosis not present

## 2022-08-11 ENCOUNTER — Ambulatory Visit: Payer: Medicare Other | Admitting: Physician Assistant

## 2022-08-11 ENCOUNTER — Encounter: Payer: Self-pay | Admitting: Physician Assistant

## 2022-08-11 VITALS — BP 137/83 | HR 85 | Ht 61.0 in | Wt 145.0 lb

## 2022-08-11 DIAGNOSIS — R300 Dysuria: Secondary | ICD-10-CM | POA: Diagnosis not present

## 2022-08-11 LAB — URINALYSIS, COMPLETE
Bilirubin, UA: NEGATIVE
Glucose, UA: NEGATIVE
Ketones, UA: NEGATIVE
Nitrite, UA: POSITIVE — AB
Specific Gravity, UA: 1.02 (ref 1.005–1.030)
Urobilinogen, Ur: 0.2 mg/dL (ref 0.2–1.0)
pH, UA: 5 (ref 5.0–7.5)

## 2022-08-11 LAB — MICROSCOPIC EXAMINATION: WBC, UA: >30 /hpf — AB (ref 0–5)

## 2022-08-11 MED ORDER — DOXYCYCLINE HYCLATE 100 MG PO CAPS: 100.0000 mg | ORAL_CAPSULE | Freq: Two times a day (BID) | ORAL | 0 refills | Status: AC

## 2022-08-11 NOTE — Patient Instructions (Signed)
Recurrent UTI Prevention Strategies  Stay well hydrated. Start taking an over-the-counter cranberry supplement for urinary tract health. Take this once or twice daily on an empty stomach, e.g. right before bed. Start taking an over-the-counter probiotic containing the bacterial species called Lactobacillus. Take this daily. Continue vaginal estrogen cream. Apply a pea-sized amount around the opening of the urethra three times weekly forever.

## 2022-08-11 NOTE — Progress Notes (Signed)
08/11/2022 12:15 PM   Andrea Dodson 21-Oct-1950 [id number]  CC: Chief Complaint  Patient presents with   Dysuria   HPI: Andrea Dodson is a 72 y.o. female with PMH diabetes, nephrolithiasis, and recurrent UTI/pyelonephritis who presents today for evaluation of possible UTI.   Today she reports a 1 to 7-month history of recurrent versus persistent UTI by her PCP.  She was initially started on Macrobid, and her symptoms improved but did not resolve.  She was then switched to Keflex 3 times daily x 7 days with symptom resolution, the day after completing Keflex, her dysuria and low back pain resumed.  This was 1 week ago.  She is staying well-hydrated.  She denies fever, chills, nausea, or vomiting.  She is using vaginal estrogen cream 3 times weekly.  She is frustrated by her frequency of infections.  In-office UA today positive for trace intact blood, 1+ protein, nitrites, and 2+ leukocytes; urine microscopy with >30 WBCs/HPF, 3-10 RBCs/HPF, and many bacteria.   PMH: Past Medical History:  Diagnosis Date   Allergy    Arthritis    right knee   Diabetes mellitus, type 2    Hypertension    Kidney stone    Urinary incontinence    Wears dentures    full upper and lower    Surgical History: Past Surgical History:  Procedure Laterality Date   APPENDECTOMY     CATARACT EXTRACTION W/PHACO Left 08/08/2019   Procedure: CATARACT EXTRACTION PHACO AND INTRAOCULAR LENS PLACEMENT (IOC) LEFT 4.59  00:32.3;  Surgeon: Nevada Crane, MD;  Location: [facility];  Service: Ophthalmology;  Laterality: Left;  Diabetic - oral meds   CATARACT EXTRACTION W/PHACO Right 09/05/2019   Procedure: CATARACT EXTRACTION PHACO AND INTRAOCULAR LENS PLACEMENT (IOC) RIGHT DIABETIC 4.65  00:34.2;  Surgeon: Nevada Crane, MD;  Location: [facility];  Service: Ophthalmology;  Laterality: Right;  Diabetic - oral meds   CHOLECYSTECTOMY  1993   CYSTOSCOPY W/ RETROGRADES Bilateral 07/14/2021    Procedure: CYSTOSCOPY WITH RETROGRADE PYELOGRAM;  Surgeon: Milderd Meager., MD;  Location: [facility];  Service: Urology;  Laterality: Bilateral;   CYSTOSCOPY/URETEROSCOPY/HOLMIUM LASER/STENT PLACEMENT Left 09/22/2020   Procedure: CYSTOSCOPY/URETEROSCOPY/HOLMIUM LASER/STENT PLACEMENT;  Surgeon: Jerilee Field, MD;  Location: [facility];  Service: Urology;  Laterality: Left;   TONSILLECTOMY     URETEROSCOPY WITH HOLMIUM LASER LITHOTRIPSY Left 07/14/2021   Procedure: URETEROSCOPY WITH LEFT STENT PLACEMENT;  Surgeon: Milderd Meager., MD;  Location: [facility];  Service: Urology;  Laterality: Left;    Home Medications:  Allergies as of 08/11/2022       Reactions   Morphine Nausea And Vomiting   Codeine Nausea And Vomiting, Other (See Comments)   Nausea vomiting,  Muscle weakness   Lisinopril Cough   ACEi-cough        Medication List        Accurate as of August 11, 2022 12:15 PM. If you have any questions, ask your nurse or doctor.          aspirin EC 81 MG tablet Take 1 tablet (81 mg total) by mouth daily.   estradiol 0.1 MG/GM vaginal cream Commonly known as: ESTRACE Estrogen Cream Instruction Discard applicator Apply pea sized amount to tip of finger to urethra before bed. Wash hands well after application. Use Monday, Wednesday and Friday   fluticasone 50 MCG/ACT nasal spray Commonly known as: FLONASE USE 2 SPRAYS IN EACH NOSTRIL DAILY. USE FOR 6-4 WEEKS THEN STOP AND USE SEASONALLY OR  AS NEEDED   glucose blood test strip Commonly known as: ONE TOUCH ULTRA TEST CHECK BLOOD SUGAR ONCE DAILY   loratadine 10 MG tablet Commonly known as: CLARITIN Take 1 tablet (10 mg total) by mouth daily.   losartan 50 MG tablet Commonly known as: COZAAR TAKE 1 TABLET BY MOUTH DAILY   metFORMIN 500 MG tablet Commonly known as: GLUCOPHAGE TAKE 1 TABLET BY MOUTH TWICE  DAILY WITH MEALS   naproxen 500 MG tablet Commonly known as: Naprosyn Take 1 tablet (500 mg total) by mouth  2 (two) times daily with a meal.   ONE TOUCH ULTRA 2 w/Device Kit 1 device for checking blood sugar once daily   onetouch ultrasoft lancets Use as instructed   phenazopyridine 200 MG tablet Commonly known as: Pyridium Take 1 tablet (200 mg total) by mouth 3 (three) times daily as needed for pain.   rosuvastatin 10 MG tablet Commonly known as: CRESTOR TAKE 1 TABLET BY MOUTH DAILY   Ryaltris 665-25 MCG/ACT Susp Generic drug: Olopatadine-Mometasone Place 1 spray into both nostrils 2 (two) times daily.        Allergies:  Allergies  Allergen Reactions   Morphine Nausea And Vomiting   Codeine Nausea And Vomiting and Other (See Comments)    Nausea vomiting,  Muscle weakness   Lisinopril Cough    ACEi-cough    Family History: Family History  Problem Relation Age of Onset   Heart failure Mother    Heart disease Mother    Diabetes Mother    Heart attack Mother    Cancer Father        lung   Heart attack Sister    Diabetes Sister    Breast cancer Half-Sister     Social History:   reports that she has never smoked. She has never been exposed to tobacco smoke. She has never used smokeless tobacco. She reports that she does not drink alcohol and does not use drugs.  Physical Exam: BP 137/83   Pulse 85   Ht  (1.549 m)   Wt 145 lb (65.8 kg)   BMI 27.40 kg/m   Constitutional:  Alert and oriented, no acute distress, nontoxic appearing HEENT: [place], AT Cardiovascular: No clubbing, cyanosis, or edema Respiratory: Normal respiratory effort, no increased work of breathing Skin: No rashes, bruises or suspicious lesions Neurologic: Grossly intact, no focal deficits, moving all 4 extremities Psychiatric: Normal mood and affect  Laboratory Data: Results for orders placed or performed in visit on 08/11/22  Microscopic Examination   Urine  Result Value Ref Range   WBC, UA >30 (A) 0 - 5 /hpf   RBC, Urine 3-10 (A) 0 - 2 /hpf   Epithelial Cells (non renal) 0-10 0 - 10 /hpf    Casts Present (A) None seen /lpf   Cast Type Granular casts (A) N/A   Bacteria, UA Many (A) None seen/Few  Urinalysis, Complete  Result Value Ref Range   Specific Gravity, UA 1.020 1.005 - 1.030   pH, UA 5.0 5.0 - 7.5   Color, UA Yellow Yellow   Appearance Ur Cloudy (A) Clear   Leukocytes,UA 2+ (A) Negative   Protein,UA 1+ (A) Negative/Trace   Glucose, UA Negative Negative   Ketones, UA Negative Negative   RBC, UA Trace (A) Negative   Bilirubin, UA Negative Negative   Urobilinogen, Ur 0.2 0.2 - 1.0 mg/dL   Nitrite, UA Positive (A) Negative   Microscopic Examination See below:    Assessment & Plan:  1. Dysuria Recurrent versus persistent UTI, no recent urine culture available to assess appropriateness of Macrobid/Keflex.  Her UA appears grossly infected today.  Will send for standard and atypical cultures, though I suspect she will grow more typical bacteria.  Will start empiric doxycycline in the meantime.  We discussed recurrent UTI prevention strategies including staying well-hydrated, cranberry supplements, lactobacillus containing probiotics, and continuing vaginal estrogen cream.  Based on her clinical response to a culture appropriate antibiotic, may consider suppressive antibiotics.  Will call her with her results. - Urinalysis, Complete - CULTURE, URINE COMPREHENSIVE - Mycoplasma / ureaplasma culture - doxycycline (VIBRAMYCIN) 100 MG capsule; Take 1 capsule (100 mg total) by mouth 2 (two) times daily for 7 days.  Dispense: 14 capsule; Refill: 0   Return for Will call with results.  Carman Ching, PA-C  [facility] [place] [address] [place], [place] [address] [phone]

## 2022-08-13 LAB — CULTURE, URINE COMPREHENSIVE

## 2022-08-15 ENCOUNTER — Ambulatory Visit (INDEPENDENT_AMBULATORY_CARE_PROVIDER_SITE_OTHER): Payer: Medicare Other

## 2022-08-15 VITALS — Ht 61.0 in | Wt 145.0 lb

## 2022-08-15 DIAGNOSIS — Z0000 Encounter for general adult medical examination without abnormal findings: Secondary | ICD-10-CM

## 2022-08-15 DIAGNOSIS — Z780 Asymptomatic menopausal state: Secondary | ICD-10-CM

## 2022-08-15 NOTE — Progress Notes (Signed)
I connected with  Andrea Dodson on 08/15/22 by a audio enabled telemedicine application and verified that I am speaking with the correct person using two identifiers.  Patient Location: Home  Provider Location: Office/Clinic  I discussed the limitations of evaluation and management by telemedicine. The patient expressed understanding and agreed to proceed.  Subjective:   Andrea Dodson is a 72 y.o. female who presents for Medicare Annual (Subsequent) preventive examination.  Review of Systems     Cardiac Risk Factors include: advanced age (>40men, >86 women);diabetes mellitus;hypertension     Objective:    There were no vitals filed for this visit. There is no height or weight on file to calculate BMI.     08/15/2022   10:22 AM 06/05/2022   12:59 PM 02/26/2021    9:19 AM 09/22/2020    9:14 AM 02/21/2020   10:47 AM 09/05/2019    6:38 AM 08/08/2019    6:40 AM  Advanced Directives  Does Patient Have a Medical Advance Directive? No No No No No Yes No  Type of Advance Directive      Healthcare Power of Attorney   Does patient want to make changes to medical advance directive?      Yes (MAU/Ambulatory/Procedural Areas - Information given)   Would patient like information on creating a medical advance directive? No - Patient declined No - Patient declined No - Patient declined No - Patient declined   Yes (MAU/Ambulatory/Procedural Areas - Information given)    Current Medications (verified) Outpatient Encounter Medications as of 08/15/2022  Medication Sig   aspirin EC 81 MG tablet Take 1 tablet (81 mg total) by mouth daily.   Blood Glucose Monitoring Suppl (ONE TOUCH ULTRA 2) w/Device KIT 1 device for checking blood sugar once daily   doxycycline (VIBRAMYCIN) 100 MG capsule Take 1 capsule (100 mg total) by mouth 2 (two) times daily for 7 days.   estradiol (ESTRACE) 0.1 MG/GM vaginal cream Estrogen Cream Instruction Discard applicator Apply pea sized amount to tip of finger to urethra  before bed. Wash hands well after application. Use Monday, Wednesday and Friday   fluticasone (FLONASE) 50 MCG/ACT nasal spray USE 2 SPRAYS IN EACH NOSTRIL DAILY. USE FOR 6-4 WEEKS THEN STOP AND USE SEASONALLY OR AS NEEDED   glucose blood (ONE TOUCH ULTRA TEST) test strip CHECK BLOOD SUGAR ONCE DAILY   Lancets (ONETOUCH ULTRASOFT) lancets Use as instructed   loratadine (CLARITIN) 10 MG tablet Take 1 tablet (10 mg total) by mouth daily.   losartan (COZAAR) 50 MG tablet TAKE 1 TABLET BY MOUTH DAILY   metFORMIN (GLUCOPHAGE) 500 MG tablet TAKE 1 TABLET BY MOUTH TWICE  DAILY WITH MEALS   naproxen (NAPROSYN) 500 MG tablet Take 1 tablet (500 mg total) by mouth 2 (two) times daily with a meal.   phenazopyridine (PYRIDIUM) 200 MG tablet Take 1 tablet (200 mg total) by mouth 3 (three) times daily as needed for pain.   rosuvastatin (CRESTOR) 10 MG tablet TAKE 1 TABLET BY MOUTH DAILY   RYALTRIS 665-25 MCG/ACT SUSP Place 1 spray into both nostrils 2 (two) times daily. (Patient not taking: Reported on 08/15/2022)   No facility-administered encounter medications on file as of 08/15/2022.    Allergies (verified) Morphine, Codeine, and Lisinopril   History: Past Medical History:  Diagnosis Date   Allergy    Arthritis    right knee   Diabetes mellitus, type 2 (HCC)    Hypertension    Kidney stone    Urinary incontinence  Wears dentures    full upper and lower   Past Surgical History:  Procedure Laterality Date   APPENDECTOMY     CATARACT EXTRACTION W/PHACO Left 08/08/2019   Procedure: CATARACT EXTRACTION PHACO AND INTRAOCULAR LENS PLACEMENT (IOC) LEFT 4.59  00:32.3;  Surgeon: Nevada Crane, MD;  Location[facility];  Service: Ophthalmology;  Laterality: Left;  Diabetic - oral meds   CATARACT EXTRACTION W/PHACO Right 09/05/2019   Procedure: CATARACT EXTRACTION PHACO AND INTRAOCULAR LENS PLACEMENT (IOC) RIGHT DIABETIC 4.65  00:34.2;  Surgeon: Nevada Crane, MD;  Location: [facility];  Service: Ophthalmology;  Laterality: Right;  Diabetic - oral meds   CHOLECYSTECTOMY  1993   CYSTOSCOPY W/ RETROGRADES Bilateral 07/14/2021   Procedure: CYSTOSCOPY WITH RETROGRADE PYELOGRAM;  Surgeon: Milderd Meager., MD;  Location: [facility];  Service: Urology;  Laterality: Bilateral;   CYSTOSCOPY/URETEROSCOPY/HOLMIUM LASER/STENT PLACEMENT Left 09/22/2020   Procedure: CYSTOSCOPY/URETEROSCOPY/HOLMIUM LASER/STENT PLACEMENT;  Surgeon: Jerilee Field, MD;  Location: [facility];  Service: Urology;  Laterality: Left;   TONSILLECTOMY     URETEROSCOPY WITH HOLMIUM LASER LITHOTRIPSY Left 07/14/2021   Procedure: URETEROSCOPY WITH LEFT STENT PLACEMENT;  Surgeon: Milderd Meager., MD;  Location: [facility];  Service: Urology;  Laterality: Left;   Family History  Problem Relation Age of Onset   Heart failure Mother    Heart disease Mother    Diabetes Mother    Heart attack Mother    Cancer Father        lung   Heart attack Sister    Diabetes Sister    Breast cancer Half-Sister    Social History   Socioeconomic History   Marital status: Widowed    Spouse name: Not on file   Number of children: Not on file   Years of education: Not on file   Highest education level: Some college, no degree  Occupational History   Occupation: retired  Tobacco Use   Smoking status: Never    Passive exposure: Never   Smokeless tobacco: Never  Vaping Use   Vaping Use: Never used  Substance and Sexual Activity   Alcohol use: No   Drug use: No   Sexual activity: Not Currently  Other Topics Concern   Not on file  Social History Narrative   Not on file   Social Determinants of Health   Financial Resource Strain: Low Risk  (08/15/2022)   Overall Financial Resource Strain (CARDIA)    Difficulty of Paying Living Expenses: Not hard at all  Food Insecurity: No Food Insecurity (08/15/2022)   Hunger Vital Sign    Worried About Running Out of Food in the Last Year: Never true    Ran Out of  Food in the Last Year: Never true  Transportation Needs: No Transportation Needs (08/15/2022)   PRAPARE - Administrator, Civil Service (Medical): No    Lack of Transportation (Non-Medical): No  Physical Activity: Insufficiently Active (08/15/2022)   Exercise Vital Sign    Days of Exercise per Week: 2 days    Minutes of Exercise per Session: 20 min  Stress: No Stress Concern Present (08/15/2022)   [employer] of Occupational Health - Occupational Stress Questionnaire    Feeling of Stress : Not at all  Social Connections: Socially Isolated (08/15/2022)   Social Connection and Isolation Panel [NHANES]    Frequency of Communication with Friends and Family: More than three times a week    Frequency of Social Gatherings with Friends and Family: Once  a week    Attends Religious Services: Never    Active Member of Clubs or Organizations: No    Attends Banker Meetings: Never    Marital Status: Widowed    Tobacco Counseling Counseling given: Not Answered   Clinical Intake:  Pre-visit preparation completed: Yes  Pain : No/denies pain     Nutritional Risks: None Diabetes: Yes CBG done?: No Did pt. bring in CBG monitor from home?: No  How often do you need to have someone help you when you read instructions, pamphlets, or other written materials from your doctor or pharmacy?: 1 - Never  Diabetic?yes Nutrition Risk Assessment:  Has the patient had any N/V/D within the last 2 months?  No  Does the patient have any non-healing wounds?  No  Has the patient had any unintentional weight loss or weight gain?  No   Diabetes:  Is the patient diabetic?  Yes  If diabetic, was a CBG obtained today?  No  Did the patient bring in their glucometer from home?  No  How often do you monitor your CBG's? Every morning.   Financial Strains and Diabetes Management:  Are you having any financial strains with the device, your supplies or your medication? No .  Does  the patient want to be seen by Chronic Care Management for management of their diabetes?  No  Would the patient like to be referred to a Nutritionist or for Diabetic Management?  No   Diabetic Exams:  Diabetic Eye Exam: Completed 10/30/21.  Pt has been advised about the importance in completing this exam.  No diabetic retinopathy Diabetic Foot Exam: Completed 02/26/22. Pt has been advised about the importance in completing this exam.   Interpreter Needed?: No  Information entered by :: Kennedy Bucker, LPN   Activities of Daily Living    08/15/2022   10:22 AM 06/06/2022    9:00 AM  In your present state of health, do you have any difficulty performing the following activities:  Hearing? 0 0  Vision? 0 0  Difficulty concentrating or making decisions? 0 0  Walking or climbing stairs? 0 0  Dressing or bathing? 0 0  Doing errands, shopping? 0 0  Preparing Food and eating ? N   Using the Toilet? N   In the past six months, have you accidently leaked urine? N   Do you have problems with loss of bowel control? N   Managing your Medications? N   Managing your Finances? N   Housekeeping or managing your Housekeeping? N     Patient Care Team: Smitty Cords, DO as PCP - General (Family Medicine)  Indicate any recent Medical Services you may have received from other than [facility] providers in the past year (date may be approximate).     Assessment:   This is a routine wellness examination for Andrea Dodson.  Hearing/Vision screen Hearing Screening - Comments:: No aids Vision Screening - Comments:: Wears glasses- Dr. Brooke Dare  Dietary issues and exercise activities discussed: Current Exercise Habits: Home exercise routine, Type of exercise: walking, Time (Minutes): 20, Frequency (Times/Week): 2, Weekly Exercise (Minutes/Week): 40, Intensity: Mild   Goals Addressed             This Visit's Progress    DIET - EAT MORE FRUITS AND VEGETABLES         Depression Screen     08/15/2022   10:20 AM 02/26/2022   10:54 AM 08/14/2021   10:36 AM 02/26/2021    9:30  AM 01/10/2021    1:24 PM 02/21/2020   10:48 AM 02/01/2020    1:51 PM  PHQ 2/9 Scores  PHQ - 2 Score 0 0 0 0 0 0 0  PHQ- 9 Score 0 0 0   0     Fall Risk    08/15/2022   10:22 AM 02/26/2022   10:53 AM 08/14/2021   10:36 AM 02/26/2021    9:16 AM 02/21/2020   10:48 AM  Fall Risk   Falls in the past year? 0 0 0 0 0  Number falls in past yr: 0 0 0 0   Injury with Fall? 0 0 0 0   Risk for fall due to : No Fall Risks No Fall Risks No Fall Risks  Medication side effect  Follow up Falls prevention discussed;Falls evaluation completed Falls evaluation completed Falls evaluation completed Falls evaluation completed;Falls prevention discussed Falls evaluation completed;Education provided;Falls prevention discussed    FALL RISK PREVENTION PERTAINING TO THE HOME:  Any stairs in or around the home? Yes  If so, are there any without handrails? No  Home free of loose throw rugs in walkways, pet beds, electrical cords, etc? Yes  Adequate lighting in your home to reduce risk of falls? Yes   ASSISTIVE DEVICES UTILIZED TO PREVENT FALLS:  Life alert? No  Use of a cane, walker or w/c? No  Grab bars in the bathroom? Yes  Shower chair or bench in shower? No  Elevated toilet seat or a handicapped toilet? No    Cognitive Function:        08/15/2022   10:27 AM 02/21/2020   10:50 AM 02/09/2018   10:34 AM  6CIT Screen  What Year? 0 points 0 points 0 points  What month? 0 points 0 points 0 points  What time? 0 points 0 points 0 points  Count back from 20 0 points 0 points 0 points  Months in reverse 0 points 0 points 0 points  Repeat phrase 0 points 0 points 0 points  Total Score 0 points 0 points 0 points    Immunizations Immunization History  Administered Date(s) Administered   Fluad Quad(high Dose 65+) 02/01/2019, 02/01/2020, 02/04/2021, 02/26/2022   Influenza, High Dose Seasonal PF 12/30/2016, 02/09/2018    Moderna Sars-Covid-2 Vaccination 09/19/2019, 10/17/2019, 04/17/2020, 02/28/2021   Pneumococcal Conjugate-13 07/11/2016   Pneumococcal Polysaccharide-23 08/10/2017   Tdap 07/11/2016   Zoster Recombinat (Shingrix) 08/14/2021, 02/26/2022    TDAP status: Up to date  Flu Vaccine status: Up to date  Pneumococcal vaccine status: Up to date  Covid-19 vaccine status: Completed vaccines  Qualifies for Shingles Vaccine? Yes   Zostavax completed No   Shingrix Completed?: Yes  Screening Tests Health Maintenance  Topic Date Due   COVID-19 Vaccine (5 - 2023-24 season) 12/20/2021   Diabetic kidney evaluation - Urine ACR  08/15/2022   OPHTHALMOLOGY EXAM  10/31/2022   INFLUENZA VACCINE  11/20/2022   HEMOGLOBIN A1C  12/04/2022   Fecal DNA (Cologuard)  02/15/2023   FOOT EXAM  02/27/2023   Diabetic kidney evaluation - eGFR measurement  06/09/2023   MAMMOGRAM  07/30/2023   Medicare Annual Wellness (AWV)  08/15/2023   DTaP/Tdap/Td (2 - Td or Tdap) 07/12/2026   Pneumonia Vaccine 43+ Years old  Completed   DEXA SCAN  Completed   Hepatitis C Screening  Completed   Zoster Vaccines- Shingrix  Completed   HPV VACCINES  Aged Out    Health Maintenance  Health Maintenance Due  Topic Date Due  COVID-19 Vaccine (5 - 2023-24 season) 12/20/2021   Diabetic kidney evaluation - Urine ACR  08/15/2022    Colorectal cancer screening: Type of screening: Cologuard. Completed 02/15/20. Repeat every 3 years  Mammogram status: Completed 07/30/22. Repeat every year  Bone Density status: Completed 04/21/06. Results reflect: Bone density results: NORMAL. Repeat every 5 years.  Lung Cancer Screening: (Low Dose CT Chest recommended if Age 34-80 years, 30 pack-year currently smoking OR have quit w/in 15years.) does not qualify.   Additional Screening:  Hepatitis C Screening: does qualify; Completed 07/15/16  Vision Screening: Recommended annual ophthalmology exams for early detection of glaucoma and other  disorders of the eye. Is the patient up to date with their annual eye exam?  Yes  Who is the provider or what is the name of the office in which the patient attends annual eye exams? Dr.King If pt is not established with a provider, would they like to be referred to a provider to establish care? No .   Dental Screening: Recommended annual dental exams for proper oral hygiene  Community Resource Referral / Chronic Care Management: CRR required this visit?  No   CCM required this visit?  No      Plan:     I have personally reviewed and noted the following in the patient's chart:   Medical and social history Use of alcohol, tobacco or illicit drugs  Current medications and supplements including opioid prescriptions. Patient is not currently taking opioid prescriptions. Functional ability and status Nutritional status Physical activity Advanced directives List of other physicians Hospitalizations, surgeries, and ER visits in previous 12 months Vitals Screenings to include cognitive, depression, and falls Referrals and appointments  In addition, I have reviewed and discussed with patient certain preventive protocols, quality metrics, and best practice recommendations. A written personalized care plan for preventive services as well as general preventive health recommendations were provided to patient.     Hal Hope, LPN   [id number]/61/[id number]   Nurse Notes: none

## 2022-08-15 NOTE — Patient Instructions (Signed)
Andrea Dodson , Thank you for taking time to come for your Medicare Wellness Visit. I appreciate your ongoing commitment to your health goals. Please review the following plan we discussed and let me know if I can assist you in the future.   These are the goals we discussed:  Goals      DIET - EAT MORE FRUITS AND VEGETABLES     DIET - INCREASE WATER INTAKE     Recommend drinking at least 6-8 glasses of water a day      Patient Stated     02/21/2020, no goals     Weight (lb) < 200 lb (90.7 kg)     Loose 5- 10 lbs         This is a list of the screening recommended for you and due dates:  Health Maintenance  Topic Date Due   COVID-19 Vaccine (5 - 2023-24 season) 12/20/2021   Yearly kidney health urinalysis for diabetes  08/15/2022   Eye exam for diabetics  10/31/2022   Flu Shot  11/20/2022   Hemoglobin A1C  12/04/2022   Cologuard (Stool DNA test)  02/15/2023   Complete foot exam   02/27/2023   Yearly kidney function blood test for diabetes  06/09/2023   Mammogram  07/30/2023   Medicare Annual Wellness Visit  08/15/2023   DTaP/Tdap/Td vaccine (2 - Td or Tdap) 07/12/2026   Pneumonia Vaccine  Completed   DEXA scan (bone density measurement)  Completed   Hepatitis C Screening: USPSTF Recommendation to screen - Ages 32-79 yo.  Completed   Zoster (Shingles) Vaccine  Completed   HPV Vaccine  Aged Out    Advanced directives: no  Conditions/risks identified: none  Next appointment: Follow up in one year for your annual wellness visit 08/21/23 @ 10:15 am by phone   Preventive Care 65 Years and Older, Female Preventive care refers to lifestyle choices and visits with your health care provider that can promote health and wellness. What does preventive care include? A yearly physical exam. This is also called an annual well check. Dental exams once or twice a year. Routine eye exams. Ask your health care provider how often you should have your eyes checked. Personal lifestyle choices,  including: Daily care of your teeth and gums. Regular physical activity. Eating a healthy diet. Avoiding tobacco and drug use. Limiting alcohol use. Practicing safe sex. Taking low-dose aspirin every day. Taking vitamin and mineral supplements as recommended by your health care provider. What happens during an annual well check? The services and screenings done by your health care provider during your annual well check will depend on your age, overall health, lifestyle risk factors, and family history of disease. Counseling  Your health care provider may ask you questions about your: Alcohol use. Tobacco use. Drug use. Emotional well-being. Home and relationship well-being. Sexual activity. Eating habits. History of falls. Memory and ability to understand (cognition). Work and work Astronomer. Reproductive health. Screening  You may have the following tests or measurements: Height, weight, and BMI. Blood pressure. Lipid and cholesterol levels. These may be checked every 5 years, or more frequently if you are over 22 years old. Skin check. Lung cancer screening. You may have this screening every year starting at age 59 if you have a 30-pack-year history of smoking and currently smoke or have quit within the past 15 years. Fecal occult blood test (FOBT) of the stool. You may have this test every year starting at age 51. Flexible sigmoidoscopy or  colonoscopy. You may have a sigmoidoscopy every 5 years or a colonoscopy every 10 years starting at age 5. Hepatitis C blood test. Hepatitis B blood test. Sexually transmitted disease (STD) testing. Diabetes screening. This is done by checking your blood sugar (glucose) after you have not eaten for a while (fasting). You may have this done every 1-3 years. Bone density scan. This is done to screen for osteoporosis. You may have this done starting at age 19. Mammogram. This may be done every 1-2 years. Talk to your health care provider  about how often you should have regular mammograms. Talk with your health care provider about your test results, treatment options, and if necessary, the need for more tests. Vaccines  Your health care provider may recommend certain vaccines, such as: Influenza vaccine. This is recommended every year. Tetanus, diphtheria, and acellular pertussis (Tdap, Td) vaccine. You may need a Td booster every 10 years. Zoster vaccine. You may need this after age 37. Pneumococcal 13-valent conjugate (PCV13) vaccine. One dose is recommended after age 70. Pneumococcal polysaccharide (PPSV23) vaccine. One dose is recommended after age 71. Talk to your health care provider about which screenings and vaccines you need and how often you need them. This information is not intended to replace advice given to you by your health care provider. Make sure you discuss any questions you have with your health care provider. Document Released: 05/04/2015 Document Revised: 12/26/2015 Document Reviewed: 02/06/2015 Elsevier Interactive Patient Education  2017 [place] Prevention in the Home Falls can cause injuries. They can happen to people of all ages. There are many things you can do to make your home safe and to help prevent falls. What can I do on the outside of my home? Regularly fix the edges of walkways and driveways and fix any cracks. Remove anything that might make you trip as you walk through a door, such as a raised step or threshold. Trim any bushes or trees on the path to your home. Use bright outdoor lighting. Clear any walking paths of anything that might make someone trip, such as rocks or tools. Regularly check to see if handrails are loose or broken. Make sure that both sides of any steps have handrails. Any raised decks and porches should have guardrails on the edges. Have any leaves, snow, or ice cleared regularly. Use sand or salt on walking paths during winter. Clean up any spills in  your garage right away. This includes oil or grease spills. What can I do in the bathroom? Use night lights. Install grab bars by the toilet and in the tub and shower. Do not use towel bars as grab bars. Use non-skid mats or decals in the tub or shower. If you need to sit down in the shower, use a plastic, non-slip stool. Keep the floor dry. Clean up any water that spills on the floor as soon as it happens. Remove soap buildup in the tub or shower regularly. Attach bath mats securely with double-sided non-slip rug tape. Do not have throw rugs and other things on the floor that can make you trip. What can I do in the bedroom? Use night lights. Make sure that you have a light by your bed that is easy to reach. Do not use any sheets or blankets that are too big for your bed. They should not hang down onto the floor. Have a firm chair that has side arms. You can use this for support while you get dressed. Do not  have throw rugs and other things on the floor that can make you trip. What can I do in the kitchen? Clean up any spills right away. Avoid walking on wet floors. Keep items that you use a lot in easy-to-reach places. If you need to reach something above you, use a strong step stool that has a grab bar. Keep electrical cords out of the way. Do not use floor polish or wax that makes floors slippery. If you must use wax, use non-skid floor wax. Do not have throw rugs and other things on the floor that can make you trip. What can I do with my stairs? Do not leave any items on the stairs. Make sure that there are handrails on both sides of the stairs and use them. Fix handrails that are broken or loose. Make sure that handrails are as long as the stairways. Check any carpeting to make sure that it is firmly attached to the stairs. Fix any carpet that is loose or worn. Avoid having throw rugs at the top or bottom of the stairs. If you do have throw rugs, attach them to the floor with carpet  tape. Make sure that you have a light switch at the top of the stairs and the bottom of the stairs. If you do not have them, ask someone to add them for you. What else can I do to help prevent falls? Wear shoes that: Do not have high heels. Have rubber bottoms. Are comfortable and fit you well. Are closed at the toe. Do not wear sandals. If you use a stepladder: Make sure that it is fully opened. Do not climb a closed stepladder. Make sure that both sides of the stepladder are locked into place. Ask someone to hold it for you, if possible. Clearly mark and make sure that you can see: Any grab bars or handrails. First and last steps. Where the edge of each step is. Use tools that help you move around (mobility aids) if they are needed. These include: Canes. Walkers. Scooters. Crutches. Turn on the lights when you go into a dark area. Replace any light bulbs as soon as they burn out. Set up your furniture so you have a clear path. Avoid moving your furniture around. If any of your floors are uneven, fix them. If there are any pets around you, be aware of where they are. Review your medicines with your doctor. Some medicines can make you feel dizzy. This can increase your chance of falling. Ask your doctor what other things that you can do to help prevent falls. This information is not intended to replace advice given to you by your health care provider. Make sure you discuss any questions you have with your health care provider. Document Released: 02/01/2009 Document Revised: 09/13/2015 Document Reviewed: 05/12/2014 Elsevier Interactive Patient Education  2017 [employer].

## 2022-08-17 LAB — MYCOPLASMA / UREAPLASMA CULTURE
Mycoplasma hominis Culture: NEGATIVE
Ureaplasma urealyticum: NEGATIVE

## 2022-08-21 ENCOUNTER — Other Ambulatory Visit: Payer: Self-pay | Admitting: Family Medicine

## 2022-08-21 DIAGNOSIS — E1169 Type 2 diabetes mellitus with other specified complication: Secondary | ICD-10-CM

## 2022-08-22 MED ORDER — ROSUVASTATIN CALCIUM 10 MG PO TABS: 10.0000 mg | ORAL_TABLET | Freq: Every day | ORAL | 1 refills | Status: DC

## 2022-08-27 ENCOUNTER — Other Ambulatory Visit: Payer: Self-pay | Admitting: Family Medicine

## 2022-08-27 ENCOUNTER — Encounter: Payer: Self-pay | Admitting: Family Medicine

## 2022-08-27 ENCOUNTER — Ambulatory Visit (INDEPENDENT_AMBULATORY_CARE_PROVIDER_SITE_OTHER): Payer: Medicare Other | Admitting: Family Medicine

## 2022-08-27 VITALS — BP 136/76 | HR 95 | Temp 96.1°F | Ht 61.0 in | Wt 148.0 lb

## 2022-08-27 DIAGNOSIS — E663 Overweight: Secondary | ICD-10-CM | POA: Diagnosis not present

## 2022-08-27 DIAGNOSIS — M7552 Bursitis of left shoulder: Secondary | ICD-10-CM | POA: Diagnosis not present

## 2022-08-27 DIAGNOSIS — D649 Anemia, unspecified: Secondary | ICD-10-CM

## 2022-08-27 DIAGNOSIS — I10 Essential (primary) hypertension: Secondary | ICD-10-CM

## 2022-08-27 DIAGNOSIS — E785 Hyperlipidemia, unspecified: Secondary | ICD-10-CM | POA: Diagnosis not present

## 2022-08-27 DIAGNOSIS — E1169 Type 2 diabetes mellitus with other specified complication: Secondary | ICD-10-CM | POA: Diagnosis not present

## 2022-08-27 DIAGNOSIS — Z0000 Encounter for general adult medical examination without abnormal findings: Secondary | ICD-10-CM

## 2022-08-27 DIAGNOSIS — Z1211 Encounter for screening for malignant neoplasm of colon: Secondary | ICD-10-CM | POA: Diagnosis not present

## 2022-08-27 MED ORDER — ONETOUCH ULTRA TEST VI STRP: ORAL_STRIP | 3 refills | Status: DC

## 2022-08-27 NOTE — Progress Notes (Signed)
Subjective:    Patient ID: Andrea Dodson, female    DOB: 03-25-1951, 72 y.o.   MRN: [id number]  Andrea Dodson is a 72 y.o. female presenting on 08/27/2022 for No chief complaint on file.   HPI  CHRONIC DM, Type 2 / Overweight BMI >27 Hyperlipidemia   Prior A1c 7.1, previously 6.9, prior range similar. Recent A1c down to 6.3 CBGs: stable readings Meds: Metformin 500mg  BID Tolerating well w/o side-effects Currently ARB / ASA daily, on Statin - Rosuvastatin 10mg  Lifestyle: - Weight stable - Diet improved DM diet. Largest mea of day is afternoon/lunch, Occasional sweets - Exercise (walking now goal to resume exercise) - Request copy of Diabetic Eye Exam 10/2021 [facility],  no Diabetic Retinopathy. Denies hypoglycemia   CHRONIC HTN: Reports no new concerns. Home BP normal Current Meds - Losartan 50mg  daily - Prior history w/ ACEi cough on Lisinopril Reports good compliance, took meds today. Tolerating well, w/o complaints.   Low Hemoglobin / Anemia Prior history. Now lower Hgb again on last labs 9-10 range in hospital 3 month ago. Asymptomatic, no bleeding or other symptoms Taking Aspirin 81    PMH - Seasonal allergies - taking Loratadine 10mg  daily, controls her allergy symptoms   Updates History of Pyelonephritis Feb 2024, she followed up with me on 06/10/22, and has since seen [facility] on 08/11/22 - she has had further testing with urine and culture, has failed multiple prior infections. She has successfully resolved the UTI on Doxycycline 2 week course, just finished recently. Has done very well.    Health Maintenance:      Last cologuard 02/15/20 negative. Repeat due 01/2023, ordered for future.     Mammogram UTD 07/2022, negative, she does every other year    UTD Pneumonia vaccine series. Prevnar-13 and Pneumovax-23 previously ages 58 and 89.   UTD Shingrix vaccines.      08/15/2022   10:20 AM 02/26/2022   10:54 AM 08/14/2021   10:36 AM  Depression screen  PHQ 2/9  Decreased Interest 0 0 0  Down, Depressed, Hopeless 0 0 0  PHQ - 2 Score 0 0 0  Altered sleeping 0 0 0  Tired, decreased energy 0 0 0  Change in appetite 0 0 0  Feeling bad or failure about yourself  0 0 0  Trouble concentrating 0 0 0  Moving slowly or fidgety/restless 0 0 0  Suicidal thoughts 0 0 0  PHQ-9 Score 0 0 0  Difficult doing work/chores Not difficult at all Not difficult at all Not difficult at all    Social History   Tobacco Use   Smoking status: Never    Passive exposure: Never   Smokeless tobacco: Never  Vaping Use   Vaping Use: Never used  Substance Use Topics   Alcohol use: No   Drug use: No    Review of Systems Per HPI unless specifically indicated above     Objective:    BP 136/76 (BP Location: Left Arm, Patient Position: Sitting, Cuff Size: Normal)   Pulse 95   Temp (!) 96.1 F (35.6 C) (Temporal)   Wt 148 lb (67.1 kg)   SpO2 96%   BMI 27.96 kg/m   Wt Readings from Last 3 Encounters:  08/27/22 148 lb (67.1 kg)  08/15/22 145 lb (65.8 kg)  08/11/22 145 lb (65.8 kg)    Physical Exam Vitals and nursing note reviewed.  Constitutional:      General: She is not in acute distress.  Appearance: She is well-developed. She is not diaphoretic.     Comments: Well-appearing, comfortable, cooperative  HENT:     Head: Normocephalic and atraumatic.  Eyes:     General:        Right eye: No discharge.        Left eye: No discharge.     Conjunctiva/sclera: Conjunctivae normal.  Neck:     Thyroid: No thyromegaly.  Cardiovascular:     Rate and Rhythm: Normal rate and regular rhythm.     Heart sounds: Normal heart sounds. No murmur heard. Pulmonary:     Effort: Pulmonary effort is normal. No respiratory distress.     Breath sounds: Normal breath sounds. No wheezing or rales.  Musculoskeletal:        General: Normal range of motion.     Cervical back: Normal range of motion and neck supple.  Lymphadenopathy:     Cervical: No cervical  adenopathy.  Skin:    General: Skin is warm and dry.     Findings: No erythema or rash.  Neurological:     Mental Status: She is alert and oriented to person, place, and time.  Psychiatric:        Behavior: Behavior normal.     Comments: Well groomed, good eye contact, normal speech and thoughts      Results for orders placed or performed in visit on 08/27/22  Hemoglobin A1c  Result Value Ref Range   Hemoglobin A1C 6.3       Assessment & Plan:   Problem List Items Addressed This Visit     Essential hypertension    Controlled Home readings normal No known complications  Failed: Lisinopril (ACEi cough)    Plan:  1. Continue current BP regimen Losartan 50mg  daily 2. Encourage improved lifestyle - low sodium diet, regular exercise 3. Continue monitor BP outside office, bring readings to next visit, if persistently >140/90 or new symptoms notify office sooner      Hyperlipidemia associated with type 2 diabetes mellitus (HCC)    Controlled lipids on statin and lifestyle Fam history HyperTG    Plan: 1. Continue Rosuvastatin 10mg  nightly 2. Continue ASA 81mg  for primary ASCVD risk reduction 3. Encourage improved lifestyle - low carb/cholesterol, reduce portion size, continue improving regular exercise      Overweight (BMI 25.0-29.9)    Wt loss and improvement Encourage lifestyle diet exercise      Type 2 diabetes mellitus with other specified complication (HCC) - Primary    Recent improved A1c to 6.3 home check No hypoglycemia Complications - other including hyperlipidemia, obesity - increases risk of future cardiovascular complications   Plan:  1. Continue Metformin IR 500mg  BID 2. Encourage improved lifestyle - low carb, low sugar diet, reduce portion size, continue improving regular exercise 3. Check CBG, bring log to next visit for review 4. Continue ASA, ARB, Statin 5. Urine microalbumin      Relevant Medications   ONETOUCH ULTRA TEST test strip    Other Relevant Orders   Urine Microalbumin w/creat. ratio   Other Visit Diagnoses     Screening for colon cancer       Relevant Orders   Cologuard   Bursitis of left shoulder           L Shoulder Bursitis Clinically consistent with history, no new injury Likely underlying arthropathy Conservative management AVS exercise for ROM and stretching Topical therapy Tylenol, NSAID AS NEEDED Follow-up if not improving next step is X-ray / injection /  PT vs consult  Orders Placed This Encounter  Procedures   Hemoglobin A1c    This external order was created through the Results Console.   Urine Microalbumin w/creat. ratio   Cologuard    Not due until October 2024     Meds ordered this encounter  Medications   ONETOUCH ULTRA TEST test strip    Sig: Use to check blood sugar 1 x daily    Dispense:  100 each    Refill:  3    E11.69, OneTouch Ultra      Follow up plan: Return in about 6 months (around 02/27/2023) for 6 month fasting lab only then 1 week later Annual Physical.  Future labs ordered for 02/2023  Saralyn Pilar, DO [facility] 08/27/2022, 1:35 PM

## 2022-08-27 NOTE — Assessment & Plan Note (Signed)
Wt loss and improvement Encourage lifestyle diet exercise 

## 2022-08-27 NOTE — Assessment & Plan Note (Signed)
Controlled lipids on statin and lifestyle Fam history HyperTG    Plan: 1. Continue Rosuvastatin 10mg nightly 2. Continue ASA 81mg for primary ASCVD risk reduction 3. Encourage improved lifestyle - low carb/cholesterol, reduce portion size, continue improving regular exercise 

## 2022-08-27 NOTE — Assessment & Plan Note (Signed)
Recent improved A1c to 6.3 home check No hypoglycemia Complications - other including hyperlipidemia, obesity - increases risk of future cardiovascular complications   Plan:  1. Continue Metformin IR 500mg  BID 2. Encourage improved lifestyle - low carb, low sugar diet, reduce portion size, continue improving regular exercise 3. Check CBG, bring log to next visit for review 4. Continue ASA, ARB, Statin 5. Urine microalbumin

## 2022-08-27 NOTE — Assessment & Plan Note (Signed)
Controlled Home readings normal No known complications  Failed: Lisinopril (ACEi cough)    Plan:  1. Continue current BP regimen Losartan 50mg daily 2. Encourage improved lifestyle - low sodium diet, regular exercise 3. Continue monitor BP outside office, bring readings to next visit, if persistently >140/90 or new symptoms notify office sooner 

## 2022-08-27 NOTE — Patient Instructions (Addendum)
Thank you for coming to the office today.  Keep up the good work!  Recent Labs    02/19/22 0818 06/05/22 1225 07/28/22 0000  HGBA1C 7.1* 6.9* 6.3   FYI your iron was mildly low back in Feb likely due to hospitalization and IV Fluid. We can check again on the iron labs Hemoglobin next visit.  Continue the supplement.  Urine test today for protein / diabetic check.  Refilled OneTouch Ultra Test strips to [facility].  --------------  Most likely you have bursitis of your shoulder. This is inflammation of the shoulder joint caused most often by arthritis or wear and tear. Often it can flare up to cause bursitis due to repetitive activities or other triggers. It may take time to heal, possibly 2 to 6 weeks, and it is important to avoid over use of shoulder especially above head motions that can re-aggravate the problem.  START anti inflammatory topical - OTC Voltaren (generic Diclofenac) topical 2-4 times a day as needed for pain swelling of affected joint for 1-2 weeks or longer.  Recommend to start taking Tylenol Extra Strength 500mg  tabs - take 1 to 2 tabs per dose (max 1000mg ) every 6-8 hours for pain (take regularly, don't skip a dose for next 7 days), max 24 hour daily dose is 6 tablets or 3000mg . In the future you can repeat the same everyday Tylenol course for 1-2 weeks at a time.   Future X-ray   DUE for FASTING BLOOD WORK (no food or drink after midnight before the lab appointment, only water or coffee without cream/sugar on the morning of)  SCHEDULE "Lab Only" visit in the morning at the clinic for lab draw in 6 MONTHS   - Make sure Lab Only appointment is at about 1 week before your next appointment, so that results will be available  For Lab Results, once available within 2-3 days of blood draw, you can can log in to MyChart online to view your results and a brief explanation. Also, we can discuss results at next follow-up visit.   Please schedule a Follow-up Appointment to:  Return in about 6 months (around 02/27/2023) for 6 month fasting lab only then 1 week later Annual Physical.  If you have any other questions or concerns, please feel free to call the office or send a message through MyChart. You may also schedule an earlier appointment if necessary.  Additionally, you may be receiving a survey about your experience at our office within a few days to 1 week by e-mail or mail. We value your feedback.  Saralyn Pilar, DO [facility], [facility]   Range of Motion Shoulder Exercises  Pendulum Circles - Lean with your good arm against a counter or table for support [facility] forward with a wide stance (make sure your body is comfortable) - Your painful shoulder should hang down and feel "heavy" - Gently move your painful arm in small circles "clockwise" for several turns - Switch to "counterclockwise" for several turns - Early on keep circles narrow and move slowly - Later in rehab, move in larger circles and faster movement   Wall Crawl - Stand close (about 1-2 ft away) to a wall, facing it directly - Reach out with your arm of painful shoulder and place fingers (not palm) on wall - You should make contact with wall at your waist level - Slowly walk your fingers up the wall. Stay in contact with wall entire time, do not remove fingers - Keep walking  fingers up wall until you reach shoulder level - You may feel tightening or mild discomfort, once you reach a height that causes pain or if you are already above your shoulder height then stop. Repeat from starting position. - Early on stand closer to wall, move fingers slowly, and stay at or below shoulder level - Later in rehab, stand farther away from wall (fingertips), move fingers quicker, go above shoulder level

## 2022-08-28 LAB — MICROALBUMIN / CREATININE URINE RATIO
Creatinine, Urine: 101 mg/dL (ref 20–275)
Microalb Creat Ratio: 9 mg/g creat (ref <30)
Microalb, Ur: 0.9 mg/dL

## 2022-09-08 ENCOUNTER — Other Ambulatory Visit: Payer: Self-pay | Admitting: Family Medicine

## 2022-09-09 DIAGNOSIS — H6981 Other specified disorders of Eustachian tube, right ear: Secondary | ICD-10-CM | POA: Diagnosis not present

## 2022-09-09 NOTE — Telephone Encounter (Signed)
Requested Prescriptions  Pending Prescriptions Disp Refills   metFORMIN (GLUCOPHAGE) 500 MG tablet [Pharmacy Med Name: metFORMIN HCl 500 MG Oral Tablet] 200 tablet 1    Sig: TAKE 1 TABLET BY MOUTH TWICE  DAILY WITH MEALS     Endocrinology:  Diabetes - Biguanides Failed - 09/08/2022 10:13 PM      Failed - B12 Level in normal range and within 720 days    No results found for: "VITAMINB12"       Passed - Cr in normal range and within 360 days    Creat  Date Value Ref Range Status  02/19/2022 0.84 0.60 - 1.00 mg/dL Final   Creatinine, Ser  Date Value Ref Range Status  06/08/2022 0.77 0.44 - 1.00 mg/dL Final   Creatinine, Urine  Date Value Ref Range Status  08/27/2022 101 20 - 275 mg/dL Final         Passed - HBA1C is between 0 and 7.9 and within 180 days    Hemoglobin A1C  Date Value Ref Range Status  07/28/2022 6.3  Final    Comment:    Home Visit, reported         Passed - eGFR in normal range and within 360 days    GFR, Est African American  Date Value Ref Range Status  01/24/2020 84 > OR = 60 mL/min/1.84m2 Final   GFR, Est Non African American  Date Value Ref Range Status  01/24/2020 72 > OR = 60 mL/min/1.65m2 Final   GFR, Estimated  Date Value Ref Range Status  06/08/2022 >60 >60 mL/min Final    Comment:    (NOTE) Calculated using the CKD-EPI Creatinine Equation (2021)    eGFR  Date Value Ref Range Status  02/19/2022 75 > OR = 60 mL/min/1.20m2 Final  02/17/2022 68 >59 mL/min/1.73 Final         Passed - Valid encounter within last 6 months    Recent Outpatient Visits           1 week ago Type 2 diabetes mellitus with other specified complication, without long-term current use of insulin (HCC)   [place] Smitty Cords, DO   3 months ago Pyelonephritis   [place] [facility] Smitty Cords, DO   6 months ago Annual physical exam   [place] [facility] Smitty Cords, DO   11 months ago Dysfunction of both eustachian tubes   [place] [facility] Smitty Cords, DO   1 year ago Type 2 diabetes mellitus with other specified complication, without long-term current use of insulin [facility])   [place] [place], Netta Neat, DO       Future Appointments             In 4 months Richardo Hanks, Laurette Schimke, MD [facility] [place]   In 6 months Althea Charon, Netta Neat, DO [place] [facility]            Passed - CBC within normal limits and completed in the last 12 months    WBC  Date Value Ref Range Status  06/08/2022 6.3 4.0 - 10.5 K/uL Final   RBC  Date Value Ref Range Status  06/08/2022 3.32 (L) 3.87 - 5.11 MIL/uL Final   Hemoglobin  Date Value Ref Range Status  06/08/2022 9.7 (L) 12.0 - 15.0 g/dL Final   HCT  Date Value Ref Range  Status  06/08/2022 27.6 (L) 36.0 - 46.0 % Final   MCHC  Date Value Ref Range Status  06/08/2022 35.1 30.0 - 36.0 g/dL Final   [facility]  Date Value Ref Range Status  06/08/2022 29.2 26.0 - 34.0 pg Final   MCV  Date Value Ref Range Status  06/08/2022 83.1 80.0 - 100.0 fL Final   No results found for: "PLTCOUNTKUC", "LABPLAT", "POCPLA" RDW  Date Value Ref Range Status  06/08/2022 12.4 11.5 - 15.5 % Final

## 2022-09-29 ENCOUNTER — Other Ambulatory Visit: Payer: Self-pay | Admitting: Family Medicine

## 2022-09-29 DIAGNOSIS — I10 Essential (primary) hypertension: Secondary | ICD-10-CM

## 2022-10-01 NOTE — Telephone Encounter (Signed)
Requested Prescriptions  Pending Prescriptions Disp Refills   losartan (COZAAR) 50 MG tablet [Pharmacy Med Name: Losartan Potassium 50 MG Oral Tablet] 90 tablet 0    Sig: TAKE 1 TABLET BY MOUTH DAILY     Cardiovascular:  Angiotensin Receptor Blockers Passed - 09/29/2022 11:58 PM      Passed - Cr in normal range and within 180 days    Creat  Date Value Ref Range Status  02/19/2022 0.84 0.60 - 1.00 mg/dL Final   Creatinine, Ser  Date Value Ref Range Status  06/08/2022 0.77 0.44 - 1.00 mg/dL Final   Creatinine, Urine  Date Value Ref Range Status  08/27/2022 101 20 - 275 mg/dL Final         Passed - K in normal range and within 180 days    Potassium  Date Value Ref Range Status  06/08/2022 3.6 3.5 - 5.1 mmol/L Final         Passed - Patient is not pregnant      Passed - Last BP in normal range    BP Readings from Last 1 Encounters:  08/27/22 136/76         Passed - Valid encounter within last 6 months    Recent Outpatient Visits           1 month ago Type 2 diabetes mellitus with other specified complication, without long-term current use of insulin [facility])   [place] [facility] Smitty Cords, DO   3 months ago Pyelonephritis   [place] [facility] Smitty Cords, DO   7 months ago Annual physical exam   [place] [facility] Smitty Cords, DO   1 year ago Dysfunction of both eustachian tubes   [place] [facility] Smitty Cords, DO   1 year ago Type 2 diabetes mellitus with other specified complication, without long-term current use of insulin [facility])   [place] [facility] Smitty Cords, DO       Future Appointments             In 3 months Richardo Hanks, Laurette Schimke, MD [facility] [place]   In 5 months Althea Charon, Netta Neat, DO [place], [facility]

## 2022-10-07 ENCOUNTER — Encounter: Payer: Self-pay | Admitting: Family Medicine

## 2022-10-07 ENCOUNTER — Ambulatory Visit
Admission: RE | Admit: 2022-10-07 | Discharge: 2022-10-07 | Disposition: A | Discharge status: Home / Self Care | Payer: Medicare Other | Source: Ambulatory Visit | Attending: Family Medicine | Admitting: Family Medicine

## 2022-10-07 DIAGNOSIS — M8589 Other specified disorders of bone density and structure, multiple sites: Secondary | ICD-10-CM | POA: Diagnosis not present

## 2022-10-07 DIAGNOSIS — Z780 Asymptomatic menopausal state: Secondary | ICD-10-CM | POA: Diagnosis not present

## 2022-10-08 ENCOUNTER — Other Ambulatory Visit: Payer: Self-pay | Admitting: Family Medicine

## 2022-10-08 DIAGNOSIS — H6993 Unspecified Eustachian tube disorder, bilateral: Secondary | ICD-10-CM

## 2022-10-08 NOTE — Telephone Encounter (Signed)
Requested Prescriptions  Pending Prescriptions Disp Refills   fluticasone (FLONASE) 50 MCG/ACT nasal spray [Pharmacy Med Name: FLUTICASONE NASAL SP (120) RX] 48 g 0    Sig: SHAKE LIQUID AND USE 2 SPRAYS IN EACH NOSTRIL DAILY FOR 4 TO 6 WEEKS THEN STOP AND USE SEASONALLY OR AS NEEDED     Ear, Nose, and Throat: Nasal Preparations - Corticosteroids Passed - 10/08/2022  3:13 AM      Passed - Valid encounter within last 12 months    Recent Outpatient Visits           1 month ago Type 2 diabetes mellitus with other specified complication, without long-term current use of insulin [facility])   [place] [facility] [place], Netta Neat, DO   4 months ago Pyelonephritis   [place] [facility] Smitty Cords, DO   7 months ago Annual physical exam   [place] [facility] Smitty Cords, DO   1 year ago Dysfunction of both eustachian tubes   [place] [facility] Smitty Cords, DO   1 year ago Type 2 diabetes mellitus with other specified complication, without long-term current use of insulin [facility])   [place] [facility] [place], Netta Neat, DO       Future Appointments             In 3 months Richardo Hanks, Laurette Schimke, MD [facility] [place]   In 5 months Althea Charon, Netta Neat, DO [place] [facility], [place] [facility]

## 2022-10-28 ENCOUNTER — Ambulatory Visit (INDEPENDENT_AMBULATORY_CARE_PROVIDER_SITE_OTHER): Payer: Medicare Other | Admitting: Family Medicine

## 2022-10-28 ENCOUNTER — Ambulatory Visit
Admission: RE | Admit: 2022-10-28 | Discharge: 2022-10-28 | Disposition: A | Discharge status: Home / Self Care | Payer: Medicare Other | Attending: Family Medicine | Admitting: Family Medicine

## 2022-10-28 ENCOUNTER — Encounter: Payer: Self-pay | Admitting: Family Medicine

## 2022-10-28 ENCOUNTER — Ambulatory Visit
Admission: RE | Admit: 2022-10-28 | Discharge: 2022-10-28 | Disposition: A | Discharge status: Home / Self Care | Payer: Medicare Other | Source: Ambulatory Visit | Attending: Family Medicine | Admitting: Family Medicine

## 2022-10-28 VITALS — BP 136/68 | HR 82 | Temp 98.6°F | Resp 18 | Ht 61.0 in | Wt 149.2 lb

## 2022-10-28 DIAGNOSIS — M79672 Pain in left foot: Secondary | ICD-10-CM | POA: Insufficient documentation

## 2022-10-28 DIAGNOSIS — M25572 Pain in left ankle and joints of left foot: Secondary | ICD-10-CM | POA: Diagnosis not present

## 2022-10-28 DIAGNOSIS — S92902A Unspecified fracture of left foot, initial encounter for closed fracture: Secondary | ICD-10-CM | POA: Diagnosis not present

## 2022-10-28 NOTE — Progress Notes (Signed)
Subjective:    Patient ID: Andrea Dodson, female    DOB: 1951-01-10, 72 y.o.   MRN: [id number]  Andrea Dodson is a 72 y.o. female presenting on 10/28/2022 for Foot Pain (Constant Lt foot Throbbing pain x 2 weeks  )   HPI  Left Foot Pain Reports 3 weeks onset, initial onset without any obvious injury, she wore sandals and walked around at [employer] and seems pain onset after that. She has used ice packs and ibuprofen. Some temporary pain relief but not resolving it. Prolonged standing, and wearing shoes can worse the pain. History of prior stress fracture years ago, had similar pain with this with stress fracture Denies redness swelling bruising numbness tingling      08/15/2022   10:20 AM 02/26/2022   10:54 AM 08/14/2021   10:36 AM  Depression screen PHQ 2/9  Decreased Interest 0 0 0  Down, Depressed, Hopeless 0 0 0  PHQ - 2 Score 0 0 0  Altered sleeping 0 0 0  Tired, decreased energy 0 0 0  Change in appetite 0 0 0  Feeling bad or failure about yourself  0 0 0  Trouble concentrating 0 0 0  Moving slowly or fidgety/restless 0 0 0  Suicidal thoughts 0 0 0  PHQ-9 Score 0 0 0  Difficult doing work/chores Not difficult at all Not difficult at all Not difficult at all    Social History   Tobacco Use   Smoking status: Never    Passive exposure: Never   Smokeless tobacco: Never  Vaping Use   Vaping Use: Never used  Substance Use Topics   Alcohol use: No   Drug use: No    Review of Systems Per HPI unless specifically indicated above     Objective:    BP 136/68   Pulse 82   Temp 98.6 F (37 C) (Oral)   Resp 18   Ht 5\' 1"  (1.549 m)   Wt 149 lb 3.2 oz (67.7 kg)   SpO2 94%   BMI 28.19 kg/m   Wt Readings from Last 3 Encounters:  10/28/22 149 lb 3.2 oz (67.7 kg)  08/27/22 148 lb (67.1 kg)  08/15/22 145 lb (65.8 kg)    Physical Exam Vitals and nursing note reviewed.  Constitutional:      General: She is not in acute distress.    Appearance: Normal  appearance. She is well-developed. She is not diaphoretic.     Comments: Well-appearing, comfortable, cooperative  HENT:     Head: Normocephalic and atraumatic.  Eyes:     General:        Right eye: No discharge.        Left eye: No discharge.     Conjunctiva/sclera: Conjunctivae normal.  Cardiovascular:     Rate and Rhythm: Normal rate.  Pulmonary:     Effort: Pulmonary effort is normal.  Musculoskeletal:     Comments: L foot appears normal no deformity, no edema bruising redness. Localized tender mid /forefoot left 3rd/4th metatarsal. Full range of motion, pain with plantar flex, not with dorsiflex, ankle inversion normal.  Skin:    General: Skin is warm and dry.     Findings: No erythema or rash.  Neurological:     Mental Status: She is alert and oriented to person, place, and time.  Psychiatric:        Mood and Affect: Mood normal.        Behavior: Behavior normal.  Thought Content: Thought content normal.     Comments: Well groomed, good eye contact, normal speech and thoughts     I have personally reviewed the radiology report from 10/28/22 on Left Foot X-ray.  CLINICAL DATA:  Left dorsal midfoot localized pain. Prior stress fracture. Dorsal third and fourth metatarsal pain.   EXAM: LEFT FOOT - COMPLETE 3+ VIEW   COMPARISON:  None Available.   FINDINGS: Small plantar calcaneal heel spur. Joint spaces are preserved. No acute fracture is seen, with attention to the third and fourth metatarsals. No dislocation.   IMPRESSION: 1. No acute fracture is seen, with attention to the third and fourth metatarsals. 2. Small plantar calcaneal heel spur.     Electronically Signed   By: Neita Garnet M.D.   On: 10/28/2022 15:43  Results for orders placed or performed in visit on 08/27/22  Hemoglobin A1c  Result Value Ref Range   Hemoglobin A1C 6.3   Urine Microalbumin w/creat. ratio  Result Value Ref Range   Creatinine, Urine 101 20 - 275 mg/dL   Microalb, Ur 0.9  mg/dL   Microalb Creat Ratio 9 <30 mg/g creat      Assessment & Plan:   Problem List Items Addressed This Visit   None Visit Diagnoses     Left foot pain    -  Primary   Relevant Orders   DG Foot Complete Left      L Foot pain uncertain etiology other than sprained foot without trauma Possible stress fracture given prior history Unlikely gout based on history  X-ray today - see results above, called 515pm today with result, negative for fracture, heel spur unrelated.   Trial Meloxicam at home for 1-2 weeks, or if you prefer ibuprofen that is fine.  Tylenol as needed.  Recommend ice, elevation  Use RICE therapy: - R - Rest / relative rest with activity modification avoid overuse of joint - I - Ice packs (make sure you use a towel or sock / something to protect skin) - C - Compression with Sock or ACE wrap to apply pressure and reduce swelling allowing more support - E - Elevation - if significant swelling, lift leg above heart level (toes above your nose) to help reduce swelling, most helpful at night after day of being on your feet   Recommend try Post-Op Shoe (rigid bottom) or Short CAM [employer]  Future consider Podiatry vs Sports med / further diagnostic imaging 2-3 more weeks if not improving or worsening.  Orders Placed This Encounter  Procedures   DG Foot Complete Left    Standing Status:   Future    Standing Expiration Date:   10/28/2023    Order Specific Question:   Reason for Exam (SYMPTOM  OR DIAGNOSIS REQUIRED)    Answer:   left foot dorsal mid foot localized pain, prior stress fracture, rule out    Order Specific Question:   Preferred imaging location?    Answer:   [facility]     No orders of the defined types were placed in this encounter.   Follow up plan: Return if symptoms worsen or fail to improve.   Saralyn Pilar, DO [facility] 10/28/2022, 2:50 PM

## 2022-10-28 NOTE — Patient Instructions (Addendum)
Thank you for coming to the office today.  Trial Meloxicam at home for 1-2 weeks, or if you prefer ibuprofen that is fine.  Tylenol as needed.  Recommend ice, elevation  Use RICE therapy: - R - Rest / relative rest with activity modification avoid overuse of joint - I - Ice packs (make sure you use a towel or sock / something to protect skin) - C - Compression with Sock or ACE wrap to apply pressure and reduce swelling allowing more support - E - Elevation - if significant swelling, lift leg above heart level (toes above your nose) to help reduce swelling, most helpful at night after day of being on your feet   Recommend try Post-Op Shoe (rigid bottom) or Short CAM [place] [employer]   Please schedule a Follow-up Appointment to: Return if symptoms worsen or fail to improve.  If you have any other questions or concerns, please feel free to call the office or send a message through MyChart. You may also schedule an earlier appointment if necessary.  Additionally, you may be receiving a survey about your experience at our office within a few days to 1 week by e-mail or mail. We value your feedback.  Saralyn Pilar, DO [facility], [place]

## 2022-10-30 ENCOUNTER — Other Ambulatory Visit: Payer: Self-pay | Admitting: Family Medicine

## 2022-10-30 DIAGNOSIS — E1169 Type 2 diabetes mellitus with other specified complication: Secondary | ICD-10-CM

## 2022-10-30 NOTE — Telephone Encounter (Signed)
Requested Prescriptions  Refused Prescriptions Disp Refills   rosuvastatin (CRESTOR) 10 MG tablet [Pharmacy Med Name: Rosuvastatin Calcium 10 MG Oral Tablet] 100 tablet 2    Sig: TAKE 1 TABLET BY MOUTH DAILY     Cardiovascular:  Antilipid - Statins 2 Failed - 10/30/2022  4:41 AM      Failed - Lipid Panel in normal range within the last 12 months    Cholesterol, Total  Date Value Ref Range Status  07/15/2016 202 (H) 100 - 199 mg/dL Final   Cholesterol  Date Value Ref Range Status  02/19/2022 115 <200 mg/dL Final   LDL Cholesterol (Calc)  Date Value Ref Range Status  02/19/2022 44 mg/dL (calc) Final    Comment:    Reference range: <100 . Desirable range <100 mg/dL for primary prevention;   <70 mg/dL for patients with CHD or diabetic patients  with > or = 2 CHD risk factors. Marland Kitchen LDL-C is now calculated using the Martin-Hopkins  calculation, which is a validated novel method providing  better accuracy than the Friedewald equation in the  estimation of LDL-C.  Horald Pollen et al. Lenox Ahr. 1610;960(45): 2061-2068  (http://education.Quest[employer].com/faq/FAQ164)    HDL  Date Value Ref Range Status  02/19/2022 48 (L) > OR = 50 mg/dL Final  40/98/1191 50 >47 mg/dL Final   Triglycerides  Date Value Ref Range Status  02/19/2022 147 <150 mg/dL Final         Passed - Cr in normal range and within 360 days    Creat  Date Value Ref Range Status  02/19/2022 0.84 0.60 - 1.00 mg/dL Final   Creatinine, Ser  Date Value Ref Range Status  06/08/2022 0.77 0.44 - 1.00 mg/dL Final   Creatinine, Urine  Date Value Ref Range Status  08/27/2022 101 20 - 275 mg/dL Final         Passed - Patient is not pregnant      Passed - Valid encounter within last 12 months    Recent Outpatient Visits           2 days ago Left foot pain   [place] [facility] Smitty Cords, DO   2 months ago Type 2 diabetes mellitus with other specified complication, without long-term  current use of insulin [facility])   [facility] [place], Netta Neat, DO   4 months ago Pyelonephritis   [place] [facility] Smitty Cords, DO   8 months ago Annual physical exam   [place] [facility] Smitty Cords, DO   1 year ago Dysfunction of both eustachian tubes   [place] [facility] Smitty Cords, DO       Future Appointments             In 2 months Richardo Hanks, Laurette Schimke, MD [facility] [place]   In 4 months Althea Charon, Netta Neat, DO [facility], [facility]

## 2022-11-06 ENCOUNTER — Ambulatory Visit: Payer: Medicare Other | Admitting: Urology

## 2022-12-02 ENCOUNTER — Other Ambulatory Visit: Payer: Self-pay | Admitting: Family Medicine

## 2022-12-02 DIAGNOSIS — E1169 Type 2 diabetes mellitus with other specified complication: Secondary | ICD-10-CM

## 2022-12-02 DIAGNOSIS — I10 Essential (primary) hypertension: Secondary | ICD-10-CM

## 2022-12-02 MED ORDER — ROSUVASTATIN CALCIUM 10 MG PO TABS
10.0000 mg | ORAL_TABLET | Freq: Every day | ORAL | 1 refills | Status: DC
Start: 2022-12-02 — End: 2023-03-23

## 2022-12-11 DIAGNOSIS — Z961 Presence of intraocular lens: Secondary | ICD-10-CM | POA: Diagnosis not present

## 2022-12-11 DIAGNOSIS — Z83518 Family history of other specified eye disorder: Secondary | ICD-10-CM | POA: Diagnosis not present

## 2022-12-11 DIAGNOSIS — E119 Type 2 diabetes mellitus without complications: Secondary | ICD-10-CM | POA: Diagnosis not present

## 2022-12-11 DIAGNOSIS — H43813 Vitreous degeneration, bilateral: Secondary | ICD-10-CM | POA: Diagnosis not present

## 2022-12-11 LAB — HM DIABETES EYE EXAM

## 2022-12-17 ENCOUNTER — Encounter: Payer: Self-pay | Admitting: Family Medicine

## 2022-12-22 DIAGNOSIS — Z1211 Encounter for screening for malignant neoplasm of colon: Secondary | ICD-10-CM | POA: Diagnosis not present

## 2023-01-13 ENCOUNTER — Other Ambulatory Visit: Payer: Self-pay | Admitting: Family Medicine

## 2023-01-13 DIAGNOSIS — H6993 Unspecified Eustachian tube disorder, bilateral: Secondary | ICD-10-CM

## 2023-01-14 ENCOUNTER — Ambulatory Visit: Payer: Medicare Other | Admitting: Urology

## 2023-01-14 ENCOUNTER — Encounter: Payer: Self-pay | Admitting: Urology

## 2023-01-14 VITALS — BP 123/73 | HR 74 | Ht 61.0 in | Wt 146.5 lb

## 2023-01-14 DIAGNOSIS — Z87440 Personal history of urinary (tract) infections: Secondary | ICD-10-CM | POA: Diagnosis not present

## 2023-01-14 DIAGNOSIS — Z87448 Personal history of other diseases of urinary system: Secondary | ICD-10-CM | POA: Diagnosis not present

## 2023-01-14 DIAGNOSIS — Z09 Encounter for follow-up examination after completed treatment for conditions other than malignant neoplasm: Secondary | ICD-10-CM

## 2023-01-14 DIAGNOSIS — N390 Urinary tract infection, site not specified: Secondary | ICD-10-CM

## 2023-01-14 NOTE — Progress Notes (Signed)
01/14/2023 1:52 PM   Andrea Dodson 1950-07-15 [id number]  Reason for visit: History of nephrolithiasis, UTI/pyelonephritis and hydronephrosis  HPI: 72 year old female who previously has undergone ureteroscopy with both Dr. Mena Goes and Dr. Pete Glatter for kidney stones.  She was hospitalized in February 2024 for left-sided pyelonephritis.  I reviewed those hospital notes as well as the CT that shows no evidence of ureteral stones, mild to moderate left-sided hydroureteronephrosis, small punctate nonobstructing renal stone x 1 bilaterally.  She improved with a course of antibiotics and follow-up renal ultrasound showed no hydronephrosis.  She has been having 3-4 UTIs, and in March 2024 opted to start topical estrogen cream.  She has done very well since that time, denies any recent infections, no dysuria or pelvic pressure.  She occasionally has some foul-smelling urine.  I encouraged her to add cranberry tablets for UTI prevention in addition to the estrogen cream.   -Continue topical estrogen cream for recurrent infections -RTC 1 year symptom check   Sondra Come, MD  [facility] [address], [address] [place], [place] [address] [phone]

## 2023-02-09 ENCOUNTER — Other Ambulatory Visit: Payer: Self-pay | Admitting: Family Medicine

## 2023-02-09 DIAGNOSIS — E1169 Type 2 diabetes mellitus with other specified complication: Secondary | ICD-10-CM

## 2023-02-10 NOTE — Telephone Encounter (Signed)
Requested Prescriptions  Refused Prescriptions Disp Refills   rosuvastatin (CRESTOR) 10 MG tablet [Pharmacy Med Name: Rosuvastatin Calcium 10 MG Oral Tablet] 100 tablet 2    Sig: TAKE 1 TABLET BY MOUTH DAILY     Cardiovascular:  Antilipid - Statins 2 Failed - 02/09/2023  4:52 AM      Failed - Lipid Panel in normal range within the last 12 months    Cholesterol, Total  Date Value Ref Range Status  07/15/2016 202 (H) 100 - 199 mg/dL Final   Cholesterol  Date Value Ref Range Status  02/19/2022 115 <200 mg/dL Final   LDL Cholesterol (Calc)  Date Value Ref Range Status  02/19/2022 44 mg/dL (calc) Final    Comment:    Reference range: <100 . Desirable range <100 mg/dL for primary prevention;   <70 mg/dL for patients with CHD or diabetic patients  with > or = 2 CHD risk factors. Marland Kitchen LDL-C is now calculated using the Martin-Hopkins  calculation, which is a validated novel method providing  better accuracy than the Friedewald equation in the  estimation of LDL-C.  Horald Pollen et al. Lenox Ahr. 1610;960(45): 2061-2068  (http://education.[facility].com/faq/FAQ164)    HDL  Date Value Ref Range Status  02/19/2022 48 (L) > OR = 50 mg/dL Final  40/98/1191 50 >47 mg/dL Final   Triglycerides  Date Value Ref Range Status  02/19/2022 147 <150 mg/dL Final         Passed - Cr in normal range and within 360 days    Creat  Date Value Ref Range Status  02/19/2022 0.84 0.60 - 1.00 mg/dL Final   Creatinine, Ser  Date Value Ref Range Status  06/08/2022 0.77 0.44 - 1.00 mg/dL Final   Creatinine, Urine  Date Value Ref Range Status  08/27/2022 101 20 - 275 mg/dL Final         Passed - Patient is not pregnant      Passed - Valid encounter within last 12 months    Recent Outpatient Visits           3 months ago Left foot pain   [place] [facility] [place], Netta Neat, DO   5 months ago Type 2 diabetes mellitus with other specified complication, without  long-term current use of insulin [facility])   [place] [facility] [place], Netta Neat, DO   8 months ago Pyelonephritis   [place] [facility] Smitty Cords, DO   11 months ago Annual physical exam   [place] [facility] Smitty Cords, DO   1 year ago Dysfunction of both eustachian tubes   [place] [facility] [place], Netta Neat, DO       Future Appointments             In 4 weeks Althea Charon, Netta Neat, DO  [facility], [facility]   In 11 months Carman Ching, [facility] [place]

## 2023-02-21 ENCOUNTER — Other Ambulatory Visit: Payer: Self-pay | Admitting: Family Medicine

## 2023-02-23 DIAGNOSIS — M1711 Unilateral primary osteoarthritis, right knee: Secondary | ICD-10-CM | POA: Diagnosis not present

## 2023-02-23 NOTE — Telephone Encounter (Signed)
Requested Prescriptions  Pending Prescriptions Disp Refills   metFORMIN (GLUCOPHAGE) 500 MG tablet [Pharmacy Med Name: metFORMIN HCl 500 MG Oral Tablet] 200 tablet 0    Sig: TAKE 1 TABLET BY MOUTH TWICE  DAILY WITH MEALS     Endocrinology:  Diabetes - Biguanides Failed - 02/21/2023 10:39 PM      Failed - HBA1C is between 0 and 7.9 and within 180 days    Hemoglobin A1C  Date Value Ref Range Status  07/28/2022 6.3  Final    Comment:    Home Visit, reported         Failed - B12 Level in normal range and within 720 days    No results found for: "VITAMINB12"       Failed - CBC within normal limits and completed in the last 12 months    WBC  Date Value Ref Range Status  06/08/2022 6.3 4.0 - 10.5 K/uL Final   RBC  Date Value Ref Range Status  06/08/2022 3.32 (L) 3.87 - 5.11 MIL/uL Final   Hemoglobin  Date Value Ref Range Status  06/08/2022 9.7 (L) 12.0 - 15.0 g/dL Final   HCT  Date Value Ref Range Status  06/08/2022 27.6 (L) 36.0 - 46.0 % Final   MCHC  Date Value Ref Range Status  06/08/2022 35.1 30.0 - 36.0 g/dL Final   [facility]  Date Value Ref Range Status  06/08/2022 29.2 26.0 - 34.0 pg Final   MCV  Date Value Ref Range Status  06/08/2022 83.1 80.0 - 100.0 fL Final   No results found for: "PLTCOUNTKUC", "LABPLAT", "POCPLA" RDW  Date Value Ref Range Status  06/08/2022 12.4 11.5 - 15.5 % Final         Passed - Cr in normal range and within 360 days    Creat  Date Value Ref Range Status  02/19/2022 0.84 0.60 - 1.00 mg/dL Final   Creatinine, Ser  Date Value Ref Range Status  06/08/2022 0.77 0.44 - 1.00 mg/dL Final   Creatinine, Urine  Date Value Ref Range Status  08/27/2022 101 20 - 275 mg/dL Final         Passed - eGFR in normal range and within 360 days    GFR, Est African American  Date Value Ref Range Status  01/24/2020 84 > OR = 60 mL/min/1.16m2 Final   GFR, Est Non African American  Date Value Ref Range Status  01/24/2020 72 > OR = 60 mL/min/1.69m2  Final   GFR, Estimated  Date Value Ref Range Status  06/08/2022 >60 >60 mL/min Final    Comment:    (NOTE) Calculated using the CKD-EPI Creatinine Equation (2021)    eGFR  Date Value Ref Range Status  02/19/2022 75 > OR = 60 mL/min/1.73m2 Final  02/17/2022 68 >59 mL/min/1.73 Final         Passed - Valid encounter within last 6 months    Recent Outpatient Visits           3 months ago Left foot pain   [place] [facility] Smitty Cords, DO   6 months ago Type 2 diabetes mellitus with other specified complication, without long-term current use of insulin [facility])   [place] [facility] Smitty Cords, DO   8 months ago Pyelonephritis   [place] [facility] Smitty Cords, DO   12 months ago Annual physical exam   [place] [facility] Smitty Cords,  DO   1 year ago Dysfunction of both eustachian tubes   [place] [facility] [place], Netta Neat, DO       Future Appointments             In 2 weeks Althea Charon, Netta Neat, DO [facility], [place]   In 10 months Carman Ching, [facility] [place]

## 2023-02-27 ENCOUNTER — Other Ambulatory Visit: Payer: Self-pay

## 2023-02-27 DIAGNOSIS — E1169 Type 2 diabetes mellitus with other specified complication: Secondary | ICD-10-CM

## 2023-02-27 DIAGNOSIS — I10 Essential (primary) hypertension: Secondary | ICD-10-CM

## 2023-02-27 DIAGNOSIS — D649 Anemia, unspecified: Secondary | ICD-10-CM

## 2023-02-27 DIAGNOSIS — Z0000 Encounter for general adult medical examination without abnormal findings: Secondary | ICD-10-CM

## 2023-03-02 ENCOUNTER — Other Ambulatory Visit: Payer: Medicare Other

## 2023-03-02 DIAGNOSIS — D649 Anemia, unspecified: Secondary | ICD-10-CM | POA: Diagnosis not present

## 2023-03-02 DIAGNOSIS — Z0000 Encounter for general adult medical examination without abnormal findings: Secondary | ICD-10-CM | POA: Diagnosis not present

## 2023-03-02 DIAGNOSIS — E785 Hyperlipidemia, unspecified: Secondary | ICD-10-CM | POA: Diagnosis not present

## 2023-03-02 DIAGNOSIS — I10 Essential (primary) hypertension: Secondary | ICD-10-CM | POA: Diagnosis not present

## 2023-03-02 DIAGNOSIS — E1169 Type 2 diabetes mellitus with other specified complication: Secondary | ICD-10-CM | POA: Diagnosis not present

## 2023-03-03 LAB — CBC WITH DIFFERENTIAL/PLATELET
Absolute Lymphocytes: 1332 {cells}/uL (ref 850–3900)
Absolute Monocytes: 360 {cells}/uL (ref 200–950)
Basophils Absolute: 48 {cells}/uL (ref 0–200)
Basophils Relative: 0.8 %
Eosinophils Absolute: 198 {cells}/uL (ref 15–500)
Eosinophils Relative: 3.3 %
HCT: 37.2 % (ref 35.0–45.0)
Hemoglobin: 12.5 g/dL (ref 11.7–15.5)
MCH: 29.9 pg (ref 27.0–33.0)
MCHC: 33.6 g/dL (ref 32.0–36.0)
MCV: 89 fL (ref 80.0–100.0)
MPV: 9.1 fL (ref 7.5–12.5)
Monocytes Relative: 6 %
Neutro Abs: 4062 {cells}/uL (ref 1500–7800)
Neutrophils Relative %: 67.7 %
Platelets: 294 10*3/uL (ref 140–400)
RBC: 4.18 10*6/uL (ref 3.80–5.10)
RDW: 12.6 % (ref 11.0–15.0)
Total Lymphocyte: 22.2 %
WBC: 6 10*3/uL (ref 3.8–10.8)

## 2023-03-03 LAB — TSH: TSH: 2.33 m[IU]/L (ref 0.40–4.50)

## 2023-03-03 LAB — LIPID PANEL
Cholesterol: 141 mg/dL (ref <200)
HDL: 52 mg/dL (ref >=50)
LDL Cholesterol (Calc): 66 mg/dL
Non-HDL Cholesterol (Calc): 89 mg/dL (ref <130)
Total CHOL/HDL Ratio: 2.7 (calc) (ref <5.0)
Triglycerides: 148 mg/dL (ref <150)

## 2023-03-03 LAB — COMPLETE METABOLIC PANEL WITH GFR
AG Ratio: 1.9 (calc) (ref 1.0–2.5)
ALT: 7 U/L (ref 6–29)
AST: 19 U/L (ref 10–35)
Albumin: 4.5 g/dL (ref 3.6–5.1)
Alkaline phosphatase (APISO): 104 U/L (ref 37–153)
BUN: 20 mg/dL (ref 7–25)
CO2: 23 mmol/L (ref 20–32)
Calcium: 9.4 mg/dL (ref 8.6–10.4)
Chloride: 103 mmol/L (ref 98–110)
Creat: 0.9 mg/dL (ref 0.60–1.00)
Globulin: 2.4 g/dL (ref 1.9–3.7)
Glucose, Bld: 130 mg/dL — ABNORMAL HIGH (ref 65–99)
Potassium: 4.6 mmol/L (ref 3.5–5.3)
Sodium: 135 mmol/L (ref 135–146)
Total Bilirubin: 0.5 mg/dL (ref 0.2–1.2)
Total Protein: 6.9 g/dL (ref 6.1–8.1)
eGFR: 68 mL/min/{1.73_m2} (ref >=60)

## 2023-03-03 LAB — IRON,TIBC AND FERRITIN PANEL
%SAT: 23 % (ref 16–45)
Ferritin: 31 ng/mL (ref 16–288)
Iron: 83 ug/dL (ref 45–160)
TIBC: 354 ug/dL (ref 250–450)

## 2023-03-03 LAB — HEMOGLOBIN A1C
Hgb A1c MFr Bld: 7.4 %{Hb} — ABNORMAL HIGH (ref <5.7)
Mean Plasma Glucose: 166 mg/dL
eAG (mmol/L): 9.2 mmol/L

## 2023-03-10 ENCOUNTER — Ambulatory Visit (INDEPENDENT_AMBULATORY_CARE_PROVIDER_SITE_OTHER): Payer: Medicare Other | Admitting: Family Medicine

## 2023-03-10 ENCOUNTER — Encounter: Payer: Self-pay | Admitting: Family Medicine

## 2023-03-10 VITALS — BP 136/72 | Ht 61.0 in | Wt 150.0 lb

## 2023-03-10 DIAGNOSIS — Z0000 Encounter for general adult medical examination without abnormal findings: Secondary | ICD-10-CM | POA: Diagnosis not present

## 2023-03-10 DIAGNOSIS — E785 Hyperlipidemia, unspecified: Secondary | ICD-10-CM | POA: Diagnosis not present

## 2023-03-10 DIAGNOSIS — M1711 Unilateral primary osteoarthritis, right knee: Secondary | ICD-10-CM

## 2023-03-10 DIAGNOSIS — E1169 Type 2 diabetes mellitus with other specified complication: Secondary | ICD-10-CM

## 2023-03-10 DIAGNOSIS — Z23 Encounter for immunization: Secondary | ICD-10-CM | POA: Diagnosis not present

## 2023-03-10 MED ORDER — METFORMIN HCL 500 MG PO TABS: 500.0000 mg | ORAL_TABLET | Freq: Two times a day (BID) | ORAL | 3 refills | Status: DC

## 2023-03-10 NOTE — Patient Instructions (Addendum)
Thank you for coming to the office today.  Keep up the great work!  Recent Labs    06/05/22 1225 07/28/22 0000 03/02/23 0835  HGBA1C 6.9* 6.3 7.4*   Goal to improve A1c sugar.  Keep up with Orthopedics on the Right knee.  Okay to take meloxicam intermittently, prefer 2+ weeks at a time  Flu Shot today   You have been referred for a Coronary Calcium Score Cardiac CT Scan. This is a screening test for patients aged 72-50+ with cardiovascular risk factors or who are healthy but would be interested in Cardiovascular Screening for heart disease. Even if there is a family history of heart disease, this imaging can be useful. Typically it can be done every 5+ years or at a different timeline we agree on  The scan will look at the chest and mainly focus on the heart and identify early signs of calcium build up or blockages within the heart arteries. It is not 100% accurate for identifying blockages or heart disease, but it is useful to help [place] predict who may have some early changes or be at risk in the future for a heart attack or cardiovascular problem.  The results are reviewed by a Cardiologist and they will document the results. It should become available on MyChart. Typically the results are divided into percentiles based on other patients of the same demographic and age. So it will compare your risk to others similar to you. If you have a higher score >99 or higher percentile >75%tile, it is recommended to consider Statin cholesterol therapy and or referral to Cardiologist. I will try to help explain your results and if we have questions we can contact the Cardiologist.  You will be contacted for scheduling. Usually it is done at any imaging facility through [facility], [facility] or [facility].  The cost is $99 flat fee total and it does not go through insurance, so no authorization is required.   Please schedule a Follow-up Appointment to: Return in  about 6 months (around 09/07/2023) for 6 month DM A1c (prefer early afternoon).  If you have any other questions or concerns, please feel free to call the office or send a message through MyChart. You may also schedule an earlier appointment if necessary.  Additionally, you may be receiving a survey about your experience at our office within a few days to 1 week by e-mail or mail. We value your feedback.  Saralyn Pilar, DO [facility], [place]

## 2023-03-10 NOTE — Progress Notes (Addendum)
Subjective:    Patient ID: Andrea Dodson, female    DOB: 04-25-1950, 72 y.o.   MRN: [id number]  Andrea Dodson is a 72 y.o. female presenting on 03/10/2023 for Annual Exam   HPI  Discussed the use of AI scribe software for clinical note transcription with the patient, who gave verbal consent to proceed.        CHRONIC DM, Type 2 / Overweight BMI >28 Hyperlipidemia   Prior A1c 7.4, previously 6.9, prior range similar. Recent A1c down to 6.3 CBGs: stable readings Meds: Metformin 500mg  BID Tolerating well w/o side-effects Currently ARB / ASA daily, on Statin - Rosuvastatin 10mg  Lifestyle: - Weight stable - Diet improved DM diet. Largest mea of day is afternoon/lunch, Occasional sweets - Exercise (walking now goal to resume exercise) - Request copy of Diabetic Eye Exam 10/2021 [facility],  no Diabetic Retinopathy. Denies hypoglycemia   CHRONIC HTN: Reports no new concerns. Home BP normal Current Meds - Losartan 50mg  daily - Prior history w/ ACEi cough on Lisinopril Reports good compliance, took meds today. Tolerating well, w/o complaints.   Low Hemoglobin / Anemia Prior history. Hospitalization for pyelonephritis had anemia Now resolved. Normalized Hgb 12.5 on last lab Asymptomatic, no bleeding or other symptoms Taking Aspirin 81    PMH - Seasonal allergies - taking Loratadine 10mg  daily, controls her allergy symptoms  The patient also reports a persistent issue with her ear feeling clogged, despite no apparent blockage or issue identified by an ENT specialist. She describes the sensation as being deep within the ear, not on the surface. The patient has a history of boils in the ear during childhood, which required lancing. She expresses frustration at the persistent discomfort and the lack of effective treatment options.    Right Knee Osteoarthritis  Last visit with me 10/2022 with Left foot pain. We did pursue X-ray imaging and found bone spur. She is following with  [facility] Dr Odis Luster and he has pursued evaluation and imaging, found to have advanced osteoarthritis, Right knee, using brace + meloxicam with some success. - She takes the Meloxicam 15mg  intermittently with good results    Health Maintenance:      Last cologuard 12/22/22  negative. Repeat 3 yr     Mammogram UTD 07/2022, negative, she does every other year    UTD Pneumonia vaccine series. Prevnar-13 and Pneumovax-23 previously ages 36 and 11. Future reconsider Prevnar-20 vaccine upgrade   UTD Shingrix vaccines.      10/28/2022    3:05 PM 08/15/2022   10:20 AM 02/26/2022   10:54 AM  Depression screen PHQ 2/9  Decreased Interest 0 0 0  Down, Depressed, Hopeless 0 0 0  PHQ - 2 Score 0 0 0  Altered sleeping 0 0 0  Tired, decreased energy 0 0 0  Change in appetite 0 0 0  Feeling bad or failure about yourself  0 0 0  Trouble concentrating 0 0 0  Moving slowly or fidgety/restless 0 0 0  Suicidal thoughts 0 0 0  PHQ-9 Score 0 0 0  Difficult doing work/chores Not difficult at all Not difficult at all Not difficult at all       10/28/2022    3:05 PM 02/26/2022   10:54 AM 08/14/2021   10:36 AM  GAD 7 : Generalized Anxiety Score  Nervous, Anxious, on Edge 0 0 0  Control/stop worrying 0 0 0  Worry too much - different things 0 0 0  Trouble relaxing 0 0 0  Restless 0 0 0  Easily annoyed or irritable 0 0 0  Afraid - awful might happen 0 0 0  Total GAD 7 Score 0 0 0  Anxiety Difficulty Not difficult at all Not difficult at all Not difficult at all     Past Medical History:  Diagnosis Date   Allergy    Arthritis    right knee   Diabetes mellitus, type 2 (HCC)    Hypertension    Kidney stone    Urinary incontinence    Urinary tract infection    Wears dentures    full upper and lower   Past Surgical History:  Procedure Laterality Date   APPENDECTOMY     CATARACT EXTRACTION W/PHACO Left 08/08/2019   Procedure: CATARACT EXTRACTION PHACO AND INTRAOCULAR LENS PLACEMENT (IOC)  LEFT 4.59  00:32.3;  Surgeon: Nevada Crane, MD;  Location: [facility];  Service: Ophthalmology;  Laterality: Left;  Diabetic - oral meds   CATARACT EXTRACTION W/PHACO Right 09/05/2019   Procedure: CATARACT EXTRACTION PHACO AND INTRAOCULAR LENS PLACEMENT (IOC) RIGHT DIABETIC 4.65  00:34.2;  Surgeon: Nevada Crane, MD;  Location: [facility];  Service: Ophthalmology;  Laterality: Right;  Diabetic - oral meds   CHOLECYSTECTOMY  1993   CYSTOSCOPY W/ RETROGRADES Bilateral 07/14/2021   Procedure: CYSTOSCOPY WITH RETROGRADE PYELOGRAM;  Surgeon: Milderd Meager., MD;  Location: [facility];  Service: Urology;  Laterality: Bilateral;   CYSTOSCOPY/URETEROSCOPY/HOLMIUM LASER/STENT PLACEMENT Left 09/22/2020   Procedure: CYSTOSCOPY/URETEROSCOPY/HOLMIUM LASER/STENT PLACEMENT;  Surgeon: Jerilee Field, MD;  Location: [facility];  Service: Urology;  Laterality: Left;   TONSILLECTOMY     URETEROSCOPY WITH HOLMIUM LASER LITHOTRIPSY Left 07/14/2021   Procedure: URETEROSCOPY WITH LEFT STENT PLACEMENT;  Surgeon: Milderd Meager., MD;  Location: [facility];  Service: Urology;  Laterality: Left;   Social History   Socioeconomic History   Marital status: Widowed    Spouse name: Not on file   Number of children: Not on file   Years of education: Not on file   Highest education level: Some college, no degree  Occupational History   Occupation: retired  Tobacco Use   Smoking status: Never    Passive exposure: Never   Smokeless tobacco: Never  Vaping Use   Vaping status: Never Used  Substance and Sexual Activity   Alcohol use: No   Drug use: No   Sexual activity: Not Currently    Birth control/protection: Post-menopausal  Other Topics Concern   Not on file  Social History Narrative   Not on file   Social Determinants of Health   Financial Resource Strain: Low Risk  (03/10/2023)   Overall Financial Resource Strain (CARDIA)    Difficulty of Paying Living Expenses: Not hard at  all  Food Insecurity: No Food Insecurity (03/10/2023)   Hunger Vital Sign    Worried About Running Out of Food in the Last Year: Never true    Ran Out of Food in the Last Year: Never true  Transportation Needs: No Transportation Needs (08/15/2022)   PRAPARE - Administrator, Civil Service (Medical): No    Lack of Transportation (Non-Medical): No  Physical Activity: Insufficiently Active (03/10/2023)   Exercise Vital Sign    Days of Exercise per Week: 2 days    Minutes of Exercise per Session: 30 min  Stress: No Stress Concern Present (03/10/2023)   [employer] of Occupational Health - Occupational Stress Questionnaire    Feeling of Stress : Not at all  Social  Connections: Socially Isolated (03/10/2023)   Social Connection and Isolation Panel [NHANES]    Frequency of Communication with Friends and Family: Never    Frequency of Social Gatherings with Friends and Family: Twice a week    Attends Religious Services: Never    Database administrator or Organizations: No    Attends Banker Meetings: Never    Marital Status: Widowed  Intimate Partner Violence: Not At Risk (03/10/2023)   Humiliation, Afraid, Rape, and Kick questionnaire    Fear of Current or Ex-Partner: No    Emotionally Abused: No    Physically Abused: No    Sexually Abused: No   Family History  Problem Relation Age of Onset   Heart failure Mother    Heart disease Mother    Diabetes Mother    Heart attack Mother    Cancer Father        lung   Heart attack Sister    Diabetes Sister    Breast cancer Half-Sister    Current Outpatient Medications on File Prior to Visit  Medication Sig   aspirin EC 81 MG tablet Take 1 tablet (81 mg total) by mouth daily.   Blood Glucose Monitoring Suppl (ONE TOUCH ULTRA 2) w/Device KIT 1 device for checking blood sugar once daily   cholecalciferol (VITAMIN D3) 25 MCG (1000 UNIT) tablet Take 1,000 Units by mouth daily.   Docusate Calcium (STOOL SOFTENER  PO) Take by mouth every morning.   estradiol (ESTRACE) 0.1 MG/GM vaginal cream Estrogen Cream Instruction Discard applicator Apply pea sized amount to tip of finger to urethra before bed. Wash hands well after application. Use Monday, Wednesday and Friday   fluticasone (FLONASE) 50 MCG/ACT nasal spray SHAKE LIQUID AND USE 2 SPRAYS IN EACH NOSTRIL DAILY FOR 4 TO 6 WEEKS THEN STOP AND USE SEASONALLY OR AS NEEDED   Lancets (ONETOUCH ULTRASOFT) lancets Use as instructed   loratadine (CLARITIN) 10 MG tablet Take 1 tablet (10 mg total) by mouth daily.   losartan (COZAAR) 50 MG tablet TAKE 1 TABLET BY MOUTH DAILY   meloxicam (MOBIC) 15 MG tablet Take 15 mg by mouth daily.   ONETOUCH ULTRA TEST test strip Use to check blood sugar 1 x daily   rosuvastatin (CRESTOR) 10 MG tablet Take 1 tablet (10 mg total) by mouth daily.   No current facility-administered medications on file prior to visit.    Review of Systems  Constitutional:  Negative for activity change, appetite change, chills, diaphoresis, fatigue and fever.  HENT:  Negative for congestion and hearing loss.   Eyes:  Negative for visual disturbance.  Respiratory:  Negative for cough, chest tightness, shortness of breath and wheezing.   Cardiovascular:  Negative for chest pain, palpitations and leg swelling.  Gastrointestinal:  Negative for abdominal pain, constipation, diarrhea, nausea and vomiting.  Genitourinary:  Negative for dysuria, frequency and hematuria.  Musculoskeletal:  Negative for arthralgias and neck pain.  Skin:  Negative for rash.  Neurological:  Negative for dizziness, weakness, light-headedness, numbness and headaches.  Hematological:  Negative for adenopathy.  Psychiatric/Behavioral:  Negative for behavioral problems, dysphoric mood and sleep disturbance.    Per HPI unless specifically indicated above     Objective:    BP 136/72   Ht 5\' 1"  (1.549 m)   Wt 150 lb (68 kg)   BMI 28.34 kg/m   Wt Readings from Last 3  Encounters:  03/10/23 150 lb (68 kg)  01/14/23 146 lb 8 oz (66.5 kg)  10/28/22 149 lb 3.2 oz (67.7 kg)    Physical Exam Vitals and nursing note reviewed.  Constitutional:      General: She is not in acute distress.    Appearance: She is well-developed. She is not diaphoretic.     Comments: Well-appearing, comfortable, cooperative  HENT:     Head: Normocephalic and atraumatic.     Ears:     Comments: Effusion behind TM Eyes:     General:        Right eye: No discharge.        Left eye: No discharge.     Conjunctiva/sclera: Conjunctivae normal.  Neck:     Thyroid: No thyromegaly.     Vascular: No carotid bruit.  Cardiovascular:     Rate and Rhythm: Normal rate and regular rhythm.     Heart sounds: Normal heart sounds. No murmur heard. Pulmonary:     Effort: Pulmonary effort is normal. No respiratory distress.     Breath sounds: Normal breath sounds. No wheezing or rales.  Musculoskeletal:        General: Normal range of motion.     Cervical back: Normal range of motion and neck supple.     Right lower leg: No edema.     Left lower leg: No edema.  Lymphadenopathy:     Cervical: No cervical adenopathy.  Skin:    General: Skin is warm and dry.     Findings: No erythema or rash.  Neurological:     Mental Status: She is alert and oriented to person, place, and time.  Psychiatric:        Behavior: Behavior normal.     Comments: Well groomed, good eye contact, normal speech and thoughts     Diabetic Foot Exam - Simple   Simple Foot Form Diabetic Foot exam was performed with the following findings: Yes 03/10/2023  1:46 PM  Visual Inspection No deformities, no ulcerations, no other skin breakdown bilaterally: Yes Sensation Testing Intact to touch and monofilament testing bilaterally: Yes Pulse Check Posterior Tibialis and Dorsalis pulse intact bilaterally: Yes Comments Bilateral feet with intact monofilament. Mild callus formation medial aspect plantar near great toe. No  ulceration.    Recent Labs    06/05/22 1225 07/28/22 0000 03/02/23 0835  HGBA1C 6.9* 6.3 7.4*    Results for orders placed or performed in visit on 02/27/23  Iron, TIBC and Ferritin Panel  Result Value Ref Range   Iron 83 45 - 160 mcg/dL   TIBC 161 096 - 045 mcg/dL (calc)   %SAT 23 16 - 45 % (calc)   Ferritin 31 16 - 288 ng/mL  TSH  Result Value Ref Range   TSH 2.33 0.40 - 4.50 mIU/L  Lipid panel  Result Value Ref Range   Cholesterol 141 <200 mg/dL   HDL 52 > OR = 50 mg/dL   Triglycerides 409 <811 mg/dL   LDL Cholesterol (Calc) 66 mg/dL (calc)   Total CHOL/HDL Ratio 2.7 <5.0 (calc)   Non-HDL Cholesterol (Calc) 89 <914 mg/dL (calc)  Hemoglobin N8G  Result Value Ref Range   Hgb A1c MFr Bld 7.4 (H) <5.7 % of total Hgb   Mean Plasma Glucose 166 mg/dL   eAG (mmol/L) 9.2 mmol/L  CBC with Differential/Platelet  Result Value Ref Range   WBC 6.0 3.8 - 10.8 Thousand/uL   RBC 4.18 3.80 - 5.10 Million/uL   Hemoglobin 12.5 11.7 - 15.5 g/dL   HCT 95.6 21.3 - 08.6 %   MCV 89.0  80.0 - 100.0 fL   MCH 29.9 27.0 - 33.0 pg   MCHC 33.6 32.0 - 36.0 g/dL   RDW 96.0 45.4 - 09.8 %   Platelets 294 140 - 400 Thousand/uL   MPV 9.1 7.5 - 12.5 fL   Neutro Abs 4,062 1,500 - 7,800 cells/uL   Absolute Lymphocytes 1,332 850 - 3,900 cells/uL   Absolute Monocytes 360 200 - 950 cells/uL   Eosinophils Absolute 198 15 - 500 cells/uL   Basophils Absolute 48 0 - 200 cells/uL   Neutrophils Relative % 67.7 %   Total Lymphocyte 22.2 %   Monocytes Relative 6.0 %   Eosinophils Relative 3.3 %   Basophils Relative 0.8 %  COMPLETE METABOLIC PANEL WITH GFR  Result Value Ref Range   Glucose, Bld 130 (H) 65 - 99 mg/dL   BUN 20 7 - 25 mg/dL   Creat 1.19 1.47 - 8.29 mg/dL   eGFR 68 > OR = 60 FA/OZH/0.86V7   BUN/Creatinine Ratio SEE NOTE: 6 - 22 (calc)   Sodium 135 135 - 146 mmol/L   Potassium 4.6 3.5 - 5.3 mmol/L   Chloride 103 98 - 110 mmol/L   CO2 23 20 - 32 mmol/L   Calcium 9.4 8.6 - 10.4 mg/dL    Total Protein 6.9 6.1 - 8.1 g/dL   Albumin 4.5 3.6 - 5.1 g/dL   Globulin 2.4 1.9 - 3.7 g/dL (calc)   AG Ratio 1.9 1.0 - 2.5 (calc)   Total Bilirubin 0.5 0.2 - 1.2 mg/dL   Alkaline phosphatase (APISO) 104 37 - 153 U/L   AST 19 10 - 35 U/L   ALT 7 6 - 29 U/L      Assessment & Plan:   Problem List Items Addressed This Visit     Hyperlipidemia associated with type 2 diabetes mellitus (HCC)    Controlled lipids on statin and lifestyle Fam history HyperTG    Plan: 1. Continue Rosuvastatin 10mg  nightly 2. Continue ASA 81mg  for primary ASCVD risk reduction 3. Encourage improved lifestyle - low carb/cholesterol, reduce portion size, continue improving regular exercise      Relevant Medications   metFORMIN (GLUCOPHAGE) 500 MG tablet   Other Relevant Orders   CT CARDIAC SCORING (SELF PAY ONLY)   Osteoarthritis of right knee    Degenerative Joint Disease (Right Knee) Followed by Ortho Managed with intermittent use of meloxicam and knee brace. Discussed potential kidney impact of meloxicam use, but current kidney function is stable. -Continue meloxicam as needed, with breaks to allow kidney function recovery. Discussed limiting NSAID to 1-2 weeks at a time, intermittent usage, monitor Cr 6-12 months      Relevant Medications   meloxicam (MOBIC) 15 MG tablet   Type 2 diabetes mellitus with other specified complication (HCC)    Elevated A1c to 7.4 - attributed to poor diet No hypoglycemia Complications - other including hyperlipidemia, obesity - increases risk of future cardiovascular complications   Plan:  1. Continue Metformin IR 500mg  BID 2. Encourage improved lifestyle - low carb, low sugar diet, reduce portion size, continue improving regular exercise 3. Check CBG, bring log to next visit for review 4. Continue ASA, ARB, Statin 5. Urine microalbumin DM Foot exam      Relevant Medications   metFORMIN (GLUCOPHAGE) 500 MG tablet   Other Visit Diagnoses     Annual physical  exam    -  Primary   Needs flu shot       Relevant Orders   Flu  Vaccine Trivalent High Dose (Fluad) (Completed)        Updated Health Maintenance information Reviewed recent lab results with patient Encouraged improvement to lifestyle with diet and exercise Goal of weight loss  Eustachian Tube Dysfunction Patient reports unilateral ear fullness. Likely due to anatomical variation. -Consider use of nasal steroids or decongestants as needed to manage symptoms.  General Health Maintenance -Administered influenza vaccine today. -Order coronary calcium score for cardiovascular risk stratification. -Schedule follow-up visit in 6 months.        Orders Placed This Encounter  Procedures   CT CARDIAC SCORING (SELF PAY ONLY)    Standing Status:   Future    Standing Expiration Date:   03/09/2024    Order Specific Question:   Preferred imaging location?    Answer:   [facility]   Flu Vaccine Trivalent High Dose (Fluad)    Meds ordered this encounter  Medications   metFORMIN (GLUCOPHAGE) 500 MG tablet    Sig: Take 1 tablet (500 mg total) by mouth 2 (two) times daily with a meal.    Dispense:  200 tablet    Refill:  3    Add future refills     Follow up plan: Return in about 6 months (around 09/07/2023) for 6 month DM A1c (prefer early afternoon).  Saralyn Pilar, DO [facility] 03/10/2023, 1:26 PM

## 2023-03-10 NOTE — Assessment & Plan Note (Signed)
Controlled lipids on statin and lifestyle Fam history HyperTG    Plan: 1. Continue Rosuvastatin '10mg'$  nightly 2. Continue ASA '81mg'$  for primary ASCVD risk reduction 3. Encourage improved lifestyle - low carb/cholesterol, reduce portion size, continue improving regular exercise

## 2023-03-10 NOTE — Assessment & Plan Note (Signed)
Degenerative Joint Disease (Right Knee) Followed by Ortho Managed with intermittent use of meloxicam and knee brace. Discussed potential kidney impact of meloxicam use, but current kidney function is stable. -Continue meloxicam as needed, with breaks to allow kidney function recovery. Discussed limiting NSAID to 1-2 weeks at a time, intermittent usage, monitor Cr 6-12 months

## 2023-03-10 NOTE — Assessment & Plan Note (Addendum)
Elevated A1c to 7.4 - attributed to poor diet No hypoglycemia Complications - other including hyperlipidemia, obesity - increases risk of future cardiovascular complications   Plan:  1. Continue Metformin IR 500mg  BID 2. Encourage improved lifestyle - low carb, low sugar diet, reduce portion size, continue improving regular exercise 3. Check CBG, bring log to next visit for review 4. Continue ASA, ARB, Statin 5. Urine microalbumin DM Foot exam

## 2023-03-13 ENCOUNTER — Ambulatory Visit
Admission: RE | Admit: 2023-03-13 | Discharge: 2023-03-13 | Disposition: A | Discharge status: Home / Self Care | Payer: Medicare Other | Source: Ambulatory Visit | Attending: Family Medicine | Admitting: Family Medicine

## 2023-03-13 DIAGNOSIS — E785 Hyperlipidemia, unspecified: Secondary | ICD-10-CM | POA: Insufficient documentation

## 2023-03-13 DIAGNOSIS — E1169 Type 2 diabetes mellitus with other specified complication: Secondary | ICD-10-CM | POA: Insufficient documentation

## 2023-03-21 ENCOUNTER — Encounter: Payer: Self-pay | Admitting: Family Medicine

## 2023-03-23 ENCOUNTER — Other Ambulatory Visit: Payer: Self-pay

## 2023-03-23 DIAGNOSIS — E1169 Type 2 diabetes mellitus with other specified complication: Secondary | ICD-10-CM

## 2023-03-23 MED ORDER — ROSUVASTATIN CALCIUM 10 MG PO TABS
10.0000 mg | ORAL_TABLET | Freq: Every day | ORAL | 1 refills | Status: DC
Start: 2023-03-23 — End: 2023-06-18

## 2023-04-06 DIAGNOSIS — M1711 Unilateral primary osteoarthritis, right knee: Secondary | ICD-10-CM | POA: Diagnosis not present

## 2023-04-12 ENCOUNTER — Other Ambulatory Visit: Payer: Self-pay | Admitting: Family Medicine

## 2023-04-12 DIAGNOSIS — H6993 Unspecified Eustachian tube disorder, bilateral: Secondary | ICD-10-CM

## 2023-04-14 NOTE — Telephone Encounter (Signed)
Requested Prescriptions  Pending Prescriptions Disp Refills   fluticasone (FLONASE) 50 MCG/ACT nasal spray [Pharmacy Med Name: FLUTICASONE NASAL SP (120) RX] 48 g 0    Sig: SHAKE LIQUID AND USE 2 SPRAYS IN EACH NOSTRIL DAILY FOR 4 TO 6 WEEKS THEN STOP AND USE SEASONALLY OR AS NEEDED     Ear, Nose, and Throat: Nasal Preparations - Corticosteroids Passed - 04/14/2023  8:38 AM      Passed - Valid encounter within last 12 months    Recent Outpatient Visits           1 month ago Annual physical exam   [facility] [place], Netta Neat, DO   5 months ago Left foot pain   [place] [place], Netta Neat, DO   7 months ago Type 2 diabetes mellitus with other specified complication, without long-term current use of insulin [facility])   [facility] Smitty Cords, DO   10 months ago Pyelonephritis   [facility] Smitty Cords, DO   1 year ago Annual physical exam   [facility] Smitty Cords, DO       Future Appointments             In 4 months Althea Charon, Netta Neat, DO [place] [facility]   In 9 months Carman Ching, [facility] [place]

## 2023-05-31 ENCOUNTER — Other Ambulatory Visit: Payer: Self-pay | Admitting: Family Medicine

## 2023-05-31 DIAGNOSIS — E1169 Type 2 diabetes mellitus with other specified complication: Secondary | ICD-10-CM

## 2023-06-01 NOTE — Telephone Encounter (Signed)
 Request too soon for refill, last refill 03/23/23 for 90 and 1 refill.  Requested Prescriptions  Pending Prescriptions Disp Refills   rosuvastatin  (CRESTOR ) 10 MG tablet [Pharmacy Med Name: Rosuvastatin  Calcium  10 MG Oral Tablet] 100 tablet 2    Sig: TAKE 1 TABLET BY MOUTH DAILY     Cardiovascular:  Antilipid - Statins 2 Failed - 06/01/2023  3:51 PM      Failed - Lipid Panel in normal range within the last 12 months    Cholesterol, Total  Date Value Ref Range Status  07/15/2016 202 (H) 100 - 199 mg/dL Final   Cholesterol  Date Value Ref Range Status  03/02/2023 141 <200 mg/dL Final   LDL Cholesterol (Calc)  Date Value Ref Range Status  03/02/2023 66 mg/dL (calc) Final    Comment:    Reference range: <100 . Desirable range <100 mg/dL for primary prevention;   <70 mg/dL for patients with CHD or diabetic patients  with > or = 2 CHD risk factors. Aaron Aas LDL-C is now calculated using the Martin-Hopkins  calculation, which is a validated novel method providing  better accuracy than the Friedewald equation in the  estimation of LDL-C.  Melinda Sprawls et al. Erroll Heard. 1610;960(45): 2061-2068  (http://education.[facility].com/faq/FAQ164)    HDL  Date Value Ref Range Status  03/02/2023 52 > OR = 50 mg/dL Final  40/98/1191 50 >47 mg/dL Final   Triglycerides  Date Value Ref Range Status  03/02/2023 148 <150 mg/dL Final         Passed - Cr in normal range and within 360 days    Creat  Date Value Ref Range Status  03/02/2023 0.90 0.60 - 1.00 mg/dL Final   Creatinine, Urine  Date Value Ref Range Status  08/27/2022 101 20 - 275 mg/dL Final         Passed - Patient is not pregnant      Passed - Valid encounter within last 12 months    Recent Outpatient Visits           2 months ago Annual physical exam   [place] [facility] Raina Bunting, DO   7 months ago Left foot pain   [place] [facility] Raina Bunting, DO   9  months ago Type 2 diabetes mellitus with other specified complication, without long-term current use of insulin  [facility])   [place] Raina Bunting, DO   11 months ago Pyelonephritis   [place] [facility] Raina Bunting, DO   1 year ago Annual physical exam   [place] [facility] Raina Bunting, DO       Future Appointments             In 3 months [place], Kayleen Party, DO [facility], [facility]   In 7 months Kathreen Pare, [facility] [place]

## 2023-06-17 ENCOUNTER — Encounter: Payer: Self-pay | Admitting: Family Medicine

## 2023-06-18 ENCOUNTER — Other Ambulatory Visit: Payer: Self-pay

## 2023-06-18 DIAGNOSIS — E1169 Type 2 diabetes mellitus with other specified complication: Secondary | ICD-10-CM

## 2023-06-18 MED ORDER — ROSUVASTATIN CALCIUM 10 MG PO TABS
10.0000 mg | ORAL_TABLET | Freq: Every day | ORAL | 1 refills | Status: DC
Start: 2023-06-18 — End: 2023-10-08

## 2023-07-13 ENCOUNTER — Other Ambulatory Visit: Payer: Self-pay | Admitting: Family Medicine

## 2023-07-13 DIAGNOSIS — H6993 Unspecified Eustachian tube disorder, bilateral: Secondary | ICD-10-CM

## 2023-07-14 NOTE — Telephone Encounter (Signed)
 Requested Prescriptions  Pending Prescriptions Disp Refills   fluticasone (FLONASE) 50 MCG/ACT nasal spray [Pharmacy Med Name: FLUTICASONE NASAL SP (120) RX] 48 g 0    Sig: SHAKE LIQUID AND USE 2 SPRAYS IN EACH NOSTRIL DAILY FOR 4 TO 6 WEEKS THEN STOP AND USE SEASONALLY OR AS NEEDED     Ear, Nose, and Throat: Nasal Preparations - Corticosteroids Passed - 07/14/2023 10:58 AM      Passed - Valid encounter within last 12 months    Recent Outpatient Visits           4 months ago Annual physical exam   [place] [facility] [place], Netta Neat, DO   8 months ago Left foot pain   [place] [facility] Smitty Cords, DO   10 months ago Type 2 diabetes mellitus with other specified complication, without long-term current use of insulin [facility])   [place] [facility] [place], Netta Neat, DO   1 year ago Pyelonephritis   [place] Smitty Cords, DO   1 year ago Annual physical exam   [place] [facility] Smitty Cords, DO       Future Appointments             In 1 month Al[place]a Charon, Netta Neat, DO  [facility], [facility]   In 6 months Carman Ching, [facility] [place]

## 2023-08-14 ENCOUNTER — Other Ambulatory Visit: Payer: Self-pay | Admitting: Family Medicine

## 2023-08-14 DIAGNOSIS — I10 Essential (primary) hypertension: Secondary | ICD-10-CM

## 2023-08-26 ENCOUNTER — Other Ambulatory Visit: Payer: Self-pay | Admitting: Family Medicine

## 2023-08-26 DIAGNOSIS — E1169 Type 2 diabetes mellitus with other specified complication: Secondary | ICD-10-CM

## 2023-08-28 NOTE — Telephone Encounter (Signed)
 Reordered 06/18/23 #90 1 RF  Requested Prescriptions  Refused Prescriptions Disp Refills   rosuvastatin  (CRESTOR ) 10 MG tablet [Pharmacy Med Name: Rosuvastatin  Calcium  10 MG Oral Tablet] 100 tablet 2    Sig: TAKE 1 TABLET BY MOUTH DAILY     Cardiovascular:  Antilipid - Statins 2 Failed - 08/28/2023  9:17 AM      Failed - Valid encounter within last 12 months    Recent Outpatient Visits   None     Future Appointments             In 1 week Karamalegos, Kayleen Party, DO [place] [facility], [facility]   In 4 months Vaillancourt, [place], PA-C [place] [facility] [place]            Failed - Lipid Panel in normal range within the last 12 months    Cholesterol, Total  Date Value Ref Range Status  07/15/2016 202 (H) 100 - 199 mg/dL Final   Cholesterol  Date Value Ref Range Status  03/02/2023 141 <200 mg/dL Final   LDL Cholesterol (Calc)  Date Value Ref Range Status  03/02/2023 66 mg/dL (calc) Final    Comment:    Reference range: <100 . Desirable range <100 mg/dL for primary prevention;   <70 mg/dL for patients with CHD or diabetic patients  with > or = 2 CHD risk factors. Aaron Aas LDL-C is now calculated using the Martin-Hopkins  calculation, which is a validated novel method providing  better accuracy than the Friedewald equation in the  estimation of LDL-C.  Melinda Sprawls et al. Erroll Heard. 4259;563(87): 2061-2068  (http://education.[facility].com/faq/FAQ164)    HDL  Date Value Ref Range Status  03/02/2023 52 > OR = 50 mg/dL Final  56/43/3295 50 >18 mg/dL Final   Triglycerides  Date Value Ref Range Status  03/02/2023 148 <150 mg/dL Final         Passed - Cr in normal range and within 360 days    Creat  Date Value Ref Range Status  03/02/2023 0.90 0.60 - 1.00 mg/dL Final   Creatinine, Urine  Date Value Ref Range Status  08/27/2022 101 20 - 275 mg/dL Final         Passed - Patient is not pregnant

## 2023-09-07 ENCOUNTER — Ambulatory Visit: Payer: Self-pay | Admitting: Family Medicine

## 2023-09-07 ENCOUNTER — Encounter: Payer: Self-pay | Admitting: Family Medicine

## 2023-09-07 ENCOUNTER — Other Ambulatory Visit: Payer: Self-pay | Admitting: Family Medicine

## 2023-09-07 VITALS — BP 122/70 | HR 73 | Ht 61.0 in | Wt 150.4 lb

## 2023-09-07 DIAGNOSIS — R350 Frequency of micturition: Secondary | ICD-10-CM | POA: Diagnosis not present

## 2023-09-07 DIAGNOSIS — E1169 Type 2 diabetes mellitus with other specified complication: Secondary | ICD-10-CM | POA: Diagnosis not present

## 2023-09-07 DIAGNOSIS — Z0000 Encounter for general adult medical examination without abnormal findings: Secondary | ICD-10-CM

## 2023-09-07 DIAGNOSIS — Z23 Encounter for immunization: Secondary | ICD-10-CM | POA: Diagnosis not present

## 2023-09-07 DIAGNOSIS — Z7984 Long term (current) use of oral hypoglycemic drugs: Secondary | ICD-10-CM

## 2023-09-07 DIAGNOSIS — M545 Low back pain, unspecified: Secondary | ICD-10-CM

## 2023-09-07 DIAGNOSIS — I10 Essential (primary) hypertension: Secondary | ICD-10-CM

## 2023-09-07 DIAGNOSIS — D649 Anemia, unspecified: Secondary | ICD-10-CM

## 2023-09-07 DIAGNOSIS — Z1231 Encounter for screening mammogram for malignant neoplasm of breast: Secondary | ICD-10-CM

## 2023-09-07 LAB — POCT GLYCOSYLATED HEMOGLOBIN (HGB A1C): Hemoglobin A1C: 7.3 % — AB (ref 4.0–5.6)

## 2023-09-07 MED ORDER — LOSARTAN POTASSIUM 50 MG PO TABS
50.0000 mg | ORAL_TABLET | Freq: Every day | ORAL | 3 refills | Status: AC
Start: 2023-09-07 — End: ?

## 2023-09-07 NOTE — Progress Notes (Signed)
 Subjective:    Patient ID: Andrea Dodson, female    DOB: August 08, 1950, 73 y.o.   MRN: [id number]  Andrea Dodson is a 73 y.o. female presenting on 09/07/2023 for Diabetes   HPI  Discussed the use of AI scribe software for clinical note transcription with the patient, who gave verbal consent to proceed.  History of Present Illness   Andrea Schaaf "Elisabeth Guild" is a 73 year old female with type 2 diabetes who presents for a six-month follow-up visit.  She has been experiencing back pain over the past month and a half, raising concern about potential kidney stones, given her history of kidney infections. The pain is located in her back. She also reports a bad smell in her urine, especially noticeable when she wakes up at night to urinate.  She had difficulty providing a urine sample during a previous test, resulting in a small amount that was reported as high in protein. She is concerned about the possibility of a urinary tract infection, as she has experienced one in the past that required hospitalization.       CHRONIC DM, Type 2 / Overweight BMI >28 Hyperlipidemia Her blood sugar levels typically range between 150 and 170 mg/dL in the mornings. Attributes to some poor diet lately. Her most recent A1c was 7.3%, a slight decrease from 7.4% six months ago. A year ago, her A1c ranged between 6.3% and 6.9%. She currently takes metformin  500 mg twice daily. She has an adequate supply of metformin  at home. Interested in dose Increase Metformin   Meds: Metformin  500mg  BID Tolerating well w/o side-effects Currently ARB / ASA daily, on Statin - Rosuvastatin  10mg  - Request copy of Diabetic Eye Exam 10/2021 [facility],  no Diabetic Retinopathy. Denies hypoglycemia   CHRONIC HTN: Reports no new concerns. Home BP normal Current Meds - Losartan  50mg  daily - Prior history w/ ACEi cough on Lisinopril  Reports good compliance, took meds today. Tolerating well, w/o complaints.   Low Hemoglobin /  Anemia Prior history. Hospitalization for pyelonephritis had anemia Now resolved. Normalized Hgb 12.5 on last lab Asymptomatic, no bleeding or other symptoms Taking Aspirin  81    PMH - Seasonal allergies - taking Loratadine  10mg  daily, controls her allergy symptoms     Health Maintenance:   Last cologuard 12/22/22  negative. Repeat 3 yr     Mammogram UTD 07/2022, negative, she does every other year    UTD Pneumonia vaccine series. Prevnar-13 and Pneumovax-23 previously ages 43 and 86. Due for Prevnar-20 today   UTD Shingrix  vaccines.      09/07/2023    1:24 PM 10/28/2022    3:05 PM 08/15/2022   10:20 AM  Depression screen PHQ 2/9  Decreased Interest 0 0 0  Down, Depressed, Hopeless 0 0 0  PHQ - 2 Score 0 0 0  Altered sleeping 0 0 0  Tired, decreased energy 1 0 0  Change in appetite 1 0 0  Feeling bad or failure about yourself  0 0 0  Trouble concentrating 0 0 0  Moving slowly or fidgety/restless 0 0 0  Suicidal thoughts 0 0 0  PHQ-9 Score 2 0 0  Difficult doing work/chores Not difficult at all Not difficult at all Not difficult at all       09/07/2023    1:24 PM 10/28/2022    3:05 PM 02/26/2022   10:54 AM 08/14/2021   10:36 AM  GAD 7 : Generalized Anxiety Score  Nervous, Anxious, on Edge 0 0 0 0  Control/stop worrying 0 0 0 0  Worry too much - different things 0 0 0 0  Trouble relaxing 0 0 0 0  Restless 0 0 0 0  Easily annoyed or irritable 0 0 0 0  Afraid - awful might happen 0 0 0 0  Total GAD 7 Score 0 0 0 0  Anxiety Difficulty Not difficult at all Not difficult at all Not difficult at all Not difficult at all    Social History   Tobacco Use   Smoking status: Never    Passive exposure: Never   Smokeless tobacco: Never  Vaping Use   Vaping status: Never Used  Substance Use Topics   Alcohol use: No   Drug use: No    Review of Systems Per HPI unless specifically indicated above     Objective:     BP 122/70 (BP Location: Left Arm, Patient Position:  Sitting, Cuff Size: Normal)   Pulse 73   Ht 5\' 1"  (1.549 m)   Wt 150 lb 6 oz (68.2 kg)   SpO2 99%   BMI 28.41 kg/m   Wt Readings from Last 3 Encounters:  09/07/23 150 lb 6 oz (68.2 kg)  03/10/23 150 lb (68 kg)  01/14/23 146 lb 8 oz (66.5 kg)    Physical Exam Vitals and nursing note reviewed.  Constitutional:      General: She is not in acute distress.    Appearance: Normal appearance. She is well-developed. She is not diaphoretic.     Comments: Well-appearing, comfortable, cooperative  HENT:     Head: Normocephalic and atraumatic.  Eyes:     General:        Right eye: No discharge.        Left eye: No discharge.     Conjunctiva/sclera: Conjunctivae normal.  Cardiovascular:     Rate and Rhythm: Normal rate.  Pulmonary:     Effort: Pulmonary effort is normal.  Skin:    General: Skin is warm and dry.     Findings: No erythema or rash.  Neurological:     Mental Status: She is alert and oriented to person, place, and time.  Psychiatric:        Mood and Affect: Mood normal.        Behavior: Behavior normal.        Thought Content: Thought content normal.     Comments: Well groomed, good eye contact, normal speech and thoughts     Results for orders placed or performed in visit on 09/07/23  POCT HgB A1C   Collection Time: 09/07/23  1:29 PM  Result Value Ref Range   Hemoglobin A1C 7.3 (A) 4.0 - 5.6 %   HbA1c POC (<> result, manual entry)     HbA1c, POC (prediabetic range)     HbA1c, POC (controlled diabetic range)        Assessment & Plan:   Problem List Items Addressed This Visit     Essential hypertension   Relevant Medications   losartan  (COZAAR ) 50 MG tablet   Type 2 diabetes mellitus with other specified complication (HCC) - Primary   Relevant Medications   losartan  (COZAAR ) 50 MG tablet   Other Relevant Orders   POCT HgB A1C (Completed)   Urine Microalbumin w/creat. ratio   Other Visit Diagnoses       Urinary frequency       Relevant Orders    Urinalysis, Routine w reflex microscopic   Urine Culture     Acute low  back pain without sciatica, unspecified back pain laterality       Relevant Orders   Urinalysis, Routine w reflex microscopic   Urine Culture     Encounter for screening mammogram for malignant neoplasm of breast       Relevant Orders   MM 3D SCREENING MAMMOGRAM BILATERAL BREAST     Need for Streptococcus pneumoniae vaccination       Relevant Orders   Pneumococcal conjugate vaccine 20-valent (Completed)     Long term current use of oral hypoglycemic drug            Type 2 Diabetes Mellitus Type 2 Diabetes Mellitus with HbA1c of 7.3%. Blood glucose levels 150-170 mg/dL. Considered increasing metformin  for better glycemic control. Discussed continuous glucose monitoring for enhanced management. - Increase metformin  to 1000 mg in the morning and 500 mg in the evening. - Consider continuous glucose monitoring. - Monitor blood glucose trends and update provider via messaging. - Consider other medication options in the future if needed.  Microalbuminuria Previous urine test showed elevated protein levels, indicating microalbuminuria. Discussed hyperglycemia's impact on renal function and the need for tighter glycemic control. - Order urine microalbumin test to assess renal function.  Urinary Tract Infection Reported back pain and malodorous urine suggest possible urinary tract infection. Differential includes concentrated urine or bacterial infection. Discussed need for urinalysis and culture. - Order urinalysis to check for infection. - Order urine culture to confirm infection if present.     Hypertension Controlled Re order Losartan  50mg   Mammogram ordered, she will schedule Prevnar -20 vaccine today   Orders Placed This Encounter  Procedures   Urine Culture   MM 3D SCREENING MAMMOGRAM BILATERAL BREAST    Standing Status:   Future    Expiration Date:   09/06/2024    Reason for Exam (SYMPTOM  OR DIAGNOSIS  REQUIRED):   Screening bilateral 3D Mammogram Tomo    Preferred imaging location?:   [facility]   Pneumococcal conjugate vaccine 20-valent   Urinalysis, Routine w reflex microscopic   Urine Microalbumin w/creat. ratio   POCT HgB A1C    Meds ordered this encounter  Medications   losartan  (COZAAR ) 50 MG tablet    Sig: Take 1 tablet (50 mg total) by mouth daily.    Dispense:  90 tablet    Refill:  3    Please send a replace/new response with 100-Day Supply if appropriate to maximize member benefit. Requesting 1 year supply.    Follow up plan: Return for 6 month fasting lab > 1 week later Annual Physical.  Future labs ordered for 03/09/24   Domingo Friend, DO [facility] 09/07/2023, 1:49 PM

## 2023-09-07 NOTE — Patient Instructions (Signed)
 Thank you for coming to the office today.  Recent Labs    03/02/23 0835 09/07/23 1329  HGBA1C 7.4* 7.3*    Please schedule a Follow-up Appointment to: No follow-ups on file.  If you have any other questions or concerns, please feel free to call the office or send a message through MyChart. You may also schedule an earlier appointment if necessary.  Additionally, you may be receiving a survey about your experience at our office within a few days to 1 week by e-mail or mail. We value your feedback.  Domingo Friend, DO [facility], [place]

## 2023-09-09 ENCOUNTER — Ambulatory Visit: Payer: Self-pay | Admitting: Family Medicine

## 2023-09-09 DIAGNOSIS — N3001 Acute cystitis with hematuria: Secondary | ICD-10-CM

## 2023-09-09 LAB — URINALYSIS, ROUTINE W REFLEX MICROSCOPIC
Bilirubin Urine: NEGATIVE
Glucose, UA: NEGATIVE
Hgb urine dipstick: NEGATIVE
Hyaline Cast: NONE SEEN /LPF
Ketones, ur: NEGATIVE
Nitrite: POSITIVE — AB
RBC / HPF: NONE SEEN /HPF (ref 0–2)
Specific Gravity, Urine: 1.018 (ref 1.001–1.035)
Squamous Epithelial / HPF: NONE SEEN /HPF (ref <=5)
pH: <=5 — AB (ref 5.0–8.0)

## 2023-09-09 LAB — MICROALBUMIN / CREATININE URINE RATIO
Creatinine, Urine: 115 mg/dL (ref 20–275)
Microalb Creat Ratio: 29 mg/g{creat} (ref <30)
Microalb, Ur: 3.3 mg/dL

## 2023-09-09 LAB — URINE CULTURE
MICRO NUMBER:: 16472943
SPECIMEN QUALITY:: ADEQUATE

## 2023-09-09 MED ORDER — CEPHALEXIN 500 MG PO CAPS
500.0000 mg | ORAL_CAPSULE | Freq: Three times a day (TID) | ORAL | 0 refills | Status: DC
Start: 2023-09-09 — End: 2024-01-08

## 2023-10-06 ENCOUNTER — Other Ambulatory Visit: Payer: Self-pay | Admitting: Family Medicine

## 2023-10-06 DIAGNOSIS — E1169 Type 2 diabetes mellitus with other specified complication: Secondary | ICD-10-CM

## 2023-10-08 NOTE — Telephone Encounter (Signed)
 Requested Prescriptions  Pending Prescriptions Disp Refills   ONETOUCH ULTRA test strip [Pharmacy Med Name: OneTouch Ultra In Vitro Strip] 100 strip 2    Sig: USE TO CHECK BLOOD SUGAR ONCE  DAILY     Endocrinology: Diabetes - Testing Supplies Passed - 10/08/2023  3:12 PM      Passed - Valid encounter within last 12 months    Recent Outpatient Visits           1 month ago Type 2 diabetes mellitus with other specified complication, without long-term current use of insulin  [facility])   [place] [facility] [place], Kayleen Party, DO       Future Appointments             In 3 months Vaillancourt, [place], PA-C [place] [facility] [place]             rosuvastatin  (CRESTOR ) 10 MG tablet [Pharmacy Med Name: Rosuvastatin  Calcium  10 MG Oral Tablet] 100 tablet 0    Sig: TAKE 1 TABLET BY MOUTH DAILY     Cardiovascular:  Antilipid - Statins 2 Failed - 10/08/2023  3:12 PM      Failed - Lipid Panel in normal range within the last 12 months    Cholesterol, Total  Date Value Ref Range Status  07/15/2016 202 (H) 100 - 199 mg/dL Final   Cholesterol  Date Value Ref Range Status  03/02/2023 141 <200 mg/dL Final   LDL Cholesterol (Calc)  Date Value Ref Range Status  03/02/2023 66 mg/dL (calc) Final    Comment:    Reference range: <100 . Desirable range <100 mg/dL for primary prevention;   <70 mg/dL for patients with CHD or diabetic patients  with > or = 2 CHD risk factors. Aaron Aas LDL-C is now calculated using the Martin-Hopkins  calculation, which is a validated novel method providing  better accuracy than the Friedewald equation in the  estimation of LDL-C.  Melinda Sprawls et al. Erroll Heard. 1610;960(45): 2061-2068  (http://education.[facility].com/faq/FAQ164)    HDL  Date Value Ref Range Status  03/02/2023 52 > OR = 50 mg/dL Final  40/98/1191 50 >47 mg/dL Final   Triglycerides  Date Value Ref Range Status  03/02/2023 148 <150 mg/dL Final         Passed - Cr  in normal range and within 360 days    Creat  Date Value Ref Range Status  03/02/2023 0.90 0.60 - 1.00 mg/dL Final   Creatinine, Urine  Date Value Ref Range Status  09/07/2023 115 20 - 275 mg/dL Final         Passed - Patient is not pregnant      Passed - Valid encounter within last 12 months    Recent Outpatient Visits           1 month ago Type 2 diabetes mellitus with other specified complication, without long-term current use of insulin  [facility])   [place] [facility] Raina Bunting, DO       Future Appointments             In 3 months Vaillancourt, Samantha, PA-C  [facility] [place]

## 2023-10-16 ENCOUNTER — Ambulatory Visit (INDEPENDENT_AMBULATORY_CARE_PROVIDER_SITE_OTHER)

## 2023-10-16 DIAGNOSIS — Z0000 Encounter for general adult medical examination without abnormal findings: Secondary | ICD-10-CM | POA: Diagnosis not present

## 2023-10-16 NOTE — Patient Instructions (Addendum)
 [name] , Thank you for taking time out of your busy schedule to complete your Annual Wellness Visit with me. I enjoyed our conversation and look forward to speaking with you again next year. I, as well as your care team,  appreciate your ongoing commitment to your health goals. Please review the following plan we discussed and let me know if I can assist you in the future.  Follow up Visits: Next Medicare AWV with our clinical staff:   10/28/24 @ 8:50 AM BY PHONE Have you seen your provider in the last 6 months (3 months if uncontrolled diabetes)? Yes  Clinician Recommendations:  Aim for 30 minutes of exercise or brisk walking, 6-8 glasses of water, and 5 servings of fruits and vegetables each day. TAKE CARE!      This is a list of the screening recommended for you and due dates:  Health Maintenance  Topic Date Due   COVID-19 Vaccine (5 - 2024-25 season) 12/21/2022   Mammogram  07/30/2023   Flu Shot  11/20/2023   Eye exam for diabetics  12/11/2023   Yearly kidney function blood test for diabetes  03/01/2024   Complete foot exam   03/09/2024   Hemoglobin A1C  03/09/2024   Yearly kidney health urinalysis for diabetes  09/06/2024   Medicare Annual Wellness Visit  10/15/2024   Cologuard (Stool DNA test)  12/21/2025   DTaP/Tdap/Td vaccine (2 - Td or Tdap) 07/12/2026   DEXA scan (bone density measurement)  10/07/2027   Pneumococcal Vaccine for age over 43  Completed   Hepatitis C Screening  Completed   Zoster (Shingles) Vaccine  Completed   Hepatitis B Vaccine  Aged Out   HPV Vaccine  Aged Out   Meningitis B Vaccine  Aged Out    Advanced directives: (ACP Link)Information on Advanced Care Planning can be found at [employer] Advance Health Care Directives Advance Health Care Directives. [email or web address]  Advance Care Planning is important because it:  [x]  Makes sure you receive the medical care that is consistent with your values, goals, and preferences  [x]  It provides  guidance to your family and loved ones and reduces their decisional burden about whether or not they are making the right decisions based on your wishes.  Follow the link provided in your after visit summary or read over the paperwork we have mailed to you to help you started getting your Advance Directives in place. If you need assistance in completing these, please reach out to us  so that we can help you!

## 2023-10-16 NOTE — Progress Notes (Signed)
 Subjective:   Andrea Dodson is a 73 y.o. who presents for a Medicare Wellness preventive visit.  As a reminder, Annual Wellness Visits don't include a physical exam, and some assessments may be limited, especially if this visit is performed virtually. We may recommend an in-person follow-up visit with your provider if needed.  Visit Complete: Virtual I connected with  Andrea Dodson on 10/16/23 by a audio enabled telemedicine application and verified that I am speaking with the correct person using two identifiers.  Patient Location: Home  Provider Location: Home Office  I discussed the limitations of evaluation and management by telemedicine. The patient expressed understanding and agreed to proceed.  Vital Signs: Because this visit was a virtual/telehealth visit, some criteria may be missing or patient reported. Any vitals not documented were not able to be obtained and vitals that have been documented are patient reported.  VideoDeclined- This patient declined Librarian, academic. Therefore the visit was completed with audio only.  Persons Participating in Visit: Patient.  AWV Questionnaire: No: Patient Medicare AWV questionnaire was not completed prior to this visit.  Cardiac Risk Factors include: advanced age (>5men, >90 women);diabetes mellitus;dyslipidemia;hypertension     Objective:    There were no vitals filed for this visit. There is no height or weight on file to calculate BMI.     10/16/2023    9:00 AM 08/15/2022   10:22 AM 06/05/2022   12:59 PM 02/26/2021    9:19 AM 09/22/2020    9:14 AM 02/21/2020   10:47 AM 09/05/2019    6:38 AM  Advanced Directives  Does Patient Have a Medical Advance Directive? No No No No No No Yes  Type of Advance Directive       Healthcare Power of Attorney  Does patient want to make changes to medical advance directive?       Yes (MAU/Ambulatory/Procedural Areas - Information given)  Would patient like  information on creating a medical advance directive? No - Patient declined No - Patient declined No - Patient declined No - Patient declined No - Patient declined      Current Medications (verified) Outpatient Encounter Medications as of 10/16/2023  Medication Sig   aspirin  EC 81 MG tablet Take 1 tablet (81 mg total) by mouth daily.   Blood Glucose Monitoring Suppl (ONE TOUCH ULTRA 2) w/Device KIT 1 device for checking blood sugar once daily   cholecalciferol (VITAMIN D3) 25 MCG (1000 UNIT) tablet Take 1,000 Units by mouth daily.   Docusate Calcium  (STOOL SOFTENER PO) Take by mouth every morning.   fluticasone  (FLONASE ) 50 MCG/ACT nasal spray SHAKE LIQUID AND USE 2 SPRAYS IN EACH NOSTRIL DAILY FOR 4 TO 6 WEEKS THEN STOP AND USE SEASONALLY OR AS NEEDED   Lancets (ONETOUCH ULTRASOFT) lancets Use as instructed   losartan  (COZAAR ) 50 MG tablet Take 1 tablet (50 mg total) by mouth daily.   meloxicam  (MOBIC ) 15 MG tablet Take 15 mg by mouth daily.   metFORMIN  (GLUCOPHAGE ) 500 MG tablet Take 1 tablet (500 mg total) by mouth 2 (two) times daily with a meal. (Patient taking differently: Take 500 mg by mouth 2 (two) times daily with a meal. TAKES ONE IN A.M. AND TWO AT NIGHT)   ONETOUCH ULTRA test strip USE TO CHECK BLOOD SUGAR ONCE  DAILY   rosuvastatin  (CRESTOR ) 10 MG tablet TAKE 1 TABLET BY MOUTH DAILY   cephALEXin  (KEFLEX ) 500 MG capsule Take 1 capsule (500 mg total) by mouth 3 (three) times daily.  For 7 days (Patient not taking: Reported on 10/16/2023)   estradiol  (ESTRACE ) 0.1 MG/GM vaginal cream Estrogen Cream Instruction Discard applicator Apply pea sized amount to tip of finger to urethra before bed. Wash hands well after application. Use Monday, Wednesday and Friday (Patient not taking: Reported on 10/16/2023)   loratadine  (CLARITIN ) 10 MG tablet Take 1 tablet (10 mg total) by mouth daily. (Patient not taking: Reported on 10/16/2023)   No facility-administered encounter medications on file as of  10/16/2023.    Allergies (verified) Morphine, Codeine , and Lisinopril    History: Past Medical History:  Diagnosis Date   Allergy    Arthritis    right knee   Diabetes mellitus, type 2 (HCC)    Hypertension    Kidney stone    Urinary incontinence    Urinary tract infection    Wears dentures    full upper and lower   Past Surgical History:  Procedure Laterality Date   APPENDECTOMY     CATARACT EXTRACTION W/PHACO Left 08/08/2019   Procedure: CATARACT EXTRACTION PHACO AND INTRAOCULAR LENS PLACEMENT (IOC) LEFT 4.59  00:32.3;  Surgeon: Myrna Adine Anes, MD;  Location: [facility];  Service: Ophthalmology;  Laterality: Left;  Diabetic - oral meds   CATARACT EXTRACTION W/PHACO Right 09/05/2019   Procedure: CATARACT EXTRACTION PHACO AND INTRAOCULAR LENS PLACEMENT (IOC) RIGHT DIABETIC 4.65  00:34.2;  Surgeon: Myrna Adine Anes, MD;  Location: [facility];  Service: Ophthalmology;  Laterality: Right;  Diabetic - oral meds   CHOLECYSTECTOMY  1993   CYSTOSCOPY W/ RETROGRADES Bilateral 07/14/2021   Procedure: CYSTOSCOPY WITH RETROGRADE PYELOGRAM;  Surgeon: Roseann Adine PARAS., MD;  Location: [facility];  Service: Urology;  Laterality: Bilateral;   CYSTOSCOPY/URETEROSCOPY/HOLMIUM LASER/STENT PLACEMENT Left 09/22/2020   Procedure: CYSTOSCOPY/URETEROSCOPY/HOLMIUM LASER/STENT PLACEMENT;  Surgeon: Nieves Cough, MD;  Location: [facility];  Service: Urology;  Laterality: Left;   TONSILLECTOMY     URETEROSCOPY WITH HOLMIUM LASER LITHOTRIPSY Left 07/14/2021   Procedure: URETEROSCOPY WITH LEFT STENT PLACEMENT;  Surgeon: Roseann Adine PARAS., MD;  Location: [facility];  Service: Urology;  Laterality: Left;   Family History  Problem Relation Age of Onset   Heart failure Mother    Heart disease Mother    Diabetes Mother    Heart attack Mother    Cancer Father        lung   Heart attack Sister    Diabetes Sister    Breast cancer Half-Sister    Social History   Socioeconomic  History   Marital status: Widowed    Spouse name: Not on file   Number of children: Not on file   Years of education: Not on file   Highest education level: Some college, no degree  Occupational History   Occupation: retired  Tobacco Use   Smoking status: Never    Passive exposure: Never   Smokeless tobacco: Never  Vaping Use   Vaping status: Never Used  Substance and Sexual Activity   Alcohol use: No   Drug use: No   Sexual activity: Not Currently    Birth control/protection: Post-menopausal  Other Topics Concern   Not on file  Social History Narrative   Not on file   Social Drivers of Health   Financial Resource Strain: Low Risk  (10/16/2023)   Overall Financial Resource Strain (CARDIA)    Difficulty of Paying Living Expenses: Not hard at all  Food Insecurity: No Food Insecurity (10/16/2023)   Hunger Vital Sign    Worried About Running Out of  Food in the Last Year: Never true    Ran Out of Food in the Last Year: Never true  Transportation Needs: No Transportation Needs (10/16/2023)   PRAPARE - Administrator, Civil Service (Medical): No    Lack of Transportation (Non-Medical): No  Physical Activity: Insufficiently Active (10/16/2023)   Exercise Vital Sign    Days of Exercise per Week: 3 days    Minutes of Exercise per Session: 30 min  Stress: No Stress Concern Present (10/16/2023)   [employer] of Occupational Health - Occupational Stress Questionnaire    Feeling of Stress: Not at all  Social Connections: Socially Isolated (10/16/2023)   Social Connection and Isolation Panel    Frequency of Communication with Friends and Family: More than three times a week    Frequency of Social Gatherings with Friends and Family: Twice a week    Attends Religious Services: Never    Database administrator or Organizations: No    Attends Banker Meetings: Never    Marital Status: Widowed    Tobacco Counseling Counseling given: Not  Answered    Clinical Intake:  Pre-visit preparation completed: Yes  Pain : No/denies pain     BMI - recorded: 28.3 Nutritional Status: BMI 25 -29 Overweight Nutritional Risks: None Diabetes: Yes CBG done?: No Did pt. bring in CBG monitor from home?: No  Lab Results  Component Value Date   HGBA1C 7.3 (A) 09/07/2023   HGBA1C 7.4 (H) 03/02/2023   HGBA1C 6.3 07/28/2022     How often do you need to have someone help you when you read instructions, pamphlets, or other written materials from your doctor or pharmacy?: 1 - Never  Interpreter Needed?: No  Information entered by :: JHONNIE DAS, LPN   Activities of Daily Living    10/16/2023    9:01 AM 10/28/2022    3:05 PM  In your present state of health, do you have any difficulty performing the following activities:  Hearing? 0 0  Vision? 0 0  Difficulty concentrating or making decisions? 0 0  Walking or climbing stairs? 0 0  Dressing or bathing? 0 0  Doing errands, shopping? 0 0  Preparing Food and eating ? N   Using the Toilet? N   In the past six months, have you accidently leaked urine? N   Do you have problems with loss of bowel control? N   Managing your Medications? N   Managing your Finances? N   Housekeeping or managing your Housekeeping? N     Patient Care Team: Edman Marsa PARAS, DO as PCP - General (Family Medicine) Myrna Adine Anes, MD as Consulting Physician (Ophthalmology)  I have updated your Care Teams any recent Medical Services you may have received from other providers in the past year.     Assessment:   This is a routine wellness examination for Andrea Dodson.  Hearing/Vision screen Hearing Screening - Comments:: NO AIDS Vision Screening - Comments:: WEARS GLASSES ALL DAY- DR.KING   Goals Addressed             This Visit's Progress    DIET - REDUCE SUGAR INTAKE         Depression Screen     10/16/2023    8:58 AM 09/07/2023    1:24 PM 10/28/2022    3:05 PM 08/15/2022    10:20 AM 02/26/2022   10:54 AM 08/14/2021   10:36 AM 02/26/2021    9:30 AM  PHQ 2/9 Scores  PHQ - 2 Score 0 0 0 0 0 0 0  PHQ- 9 Score 0 2 0 0 0 0     Fall Risk     10/16/2023    9:01 AM 09/07/2023    1:24 PM 10/28/2022    3:06 PM 08/15/2022   10:22 AM 02/26/2022   10:53 AM  Fall Risk   Falls in the past year? 0 0 0 0 0  Number falls in past yr: 0  0 0 0  Injury with Fall? 0  0 0 0  Risk for fall due to : No Fall Risks  No Fall Risks No Fall Risks No Fall Risks  Follow up Falls evaluation completed  Falls evaluation completed Falls prevention discussed;Falls evaluation completed Falls evaluation completed      Data saved with a previous flowsheet row definition    MEDICARE RISK AT HOME:  Medicare Risk at Home Any stairs in or around the home?: Yes If so, are there any without handrails?: No Home free of loose throw rugs in walkways, pet beds, electrical cords, etc?: Yes Adequate lighting in your home to reduce risk of falls?: Yes Life alert?: No Use of a cane, walker or w/c?: No Grab bars in the bathroom?: Yes Shower chair or bench in shower?: Yes Elevated toilet seat or a handicapped toilet?: No  TIMED UP AND GO:  Was the test performed?  No  Cognitive Function: 6CIT completed        10/16/2023    9:02 AM 08/15/2022   10:27 AM 02/21/2020   10:50 AM 02/09/2018   10:34 AM  6CIT Screen  What Year? 0 points 0 points 0 points 0 points  What month? 0 points 0 points 0 points 0 points  What time? 0 points 0 points 0 points 0 points  Count back from 20 0 points 0 points 0 points 0 points  Months in reverse 0 points 0 points 0 points 0 points  Repeat phrase 0 points 0 points 0 points 0 points  Total Score 0 points 0 points 0 points 0 points    Immunizations Immunization History  Administered Date(s) Administered   Fluad Quad(high Dose 65+) 02/01/2019, 02/01/2020, 02/04/2021, 02/26/2022   Fluad Trivalent(High Dose 65+) 03/10/2023   Influenza, High Dose Seasonal PF  12/30/2016, 02/09/2018   Moderna Sars-Covid-2 Vaccination 09/19/2019, 10/17/2019, 04/17/2020, 02/28/2021   PNEUMOCOCCAL CONJUGATE-20 09/07/2023   Pneumococcal Conjugate-13 07/11/2016   Pneumococcal Polysaccharide-23 08/10/2017   Tdap 07/11/2016   Zoster Recombinant(Shingrix ) 08/14/2021, 02/26/2022    Screening Tests Health Maintenance  Topic Date Due   COVID-19 Vaccine (5 - 2024-25 season) 12/21/2022   MAMMOGRAM  07/30/2023   INFLUENZA VACCINE  11/20/2023   OPHTHALMOLOGY EXAM  12/11/2023   Diabetic kidney evaluation - eGFR measurement  03/01/2024   FOOT EXAM  03/09/2024   HEMOGLOBIN A1C  03/09/2024   Diabetic kidney evaluation - Urine ACR  09/06/2024   Medicare Annual Wellness (AWV)  10/15/2024   Fecal DNA (Cologuard)  12/21/2025   DTaP/Tdap/Td (2 - Td or Tdap) 07/12/2026   DEXA SCAN  10/07/2027   Pneumococcal Vaccine: 50+ Years  Completed   Hepatitis C Screening  Completed   Zoster Vaccines- Shingrix   Completed   Hepatitis B Vaccines  Aged Out   HPV VACCINES  Aged Out   Meningococcal B Vaccine  Aged Out    Health Maintenance  Health Maintenance Due  Topic Date Due   COVID-19 Vaccine (5 - 2024-25 season) 12/21/2022   MAMMOGRAM  07/30/2023  Health Maintenance Items Addressed: UP TO DATE ON SHOTS EXCEPT COVID; UP TO DATE ON MAMMOGRAM, BDS & COLONOSCOPY  Additional Screening:  Vision Screening: Recommended annual ophthalmology exams for early detection of glaucoma and other disorders of the eye. Would you like a referral to an eye doctor? No    Dental Screening: Recommended annual dental exams for proper oral hygiene  Community Resource Referral / Chronic Care Management: CRR required this visit?  No   CCM required this visit?  No   Plan:    I have personally reviewed and noted the following in the patient's chart:   Medical and social history Use of alcohol, tobacco or illicit drugs  Current medications and supplements including opioid prescriptions.  Patient is not currently taking opioid prescriptions. Functional ability and status Nutritional status Physical activity Advanced directives List of other physicians Hospitalizations, surgeries, and ER visits in previous 12 months Vitals Screenings to include cognitive, depression, and falls Referrals and appointments  In addition, I have reviewed and discussed with patient certain preventive protocols, quality metrics, and best practice recommendations. A written personalized care plan for preventive services as well as general preventive health recommendations were provided to patient.   Jhonnie GORMAN Das, LPN   3/72/7974   After Visit Summary: (MyChart) Due to this being a telephonic visit, the after visit summary with patients personalized plan was offered to patient via MyChart   Notes: Nothing significant to report at this time.

## 2023-10-28 ENCOUNTER — Other Ambulatory Visit: Payer: Self-pay | Admitting: Family Medicine

## 2023-10-28 DIAGNOSIS — E1169 Type 2 diabetes mellitus with other specified complication: Secondary | ICD-10-CM

## 2023-10-28 MED ORDER — ACCU-CHEK GUIDE ME W/DEVICE KIT
PACK | 0 refills | Status: DC
Start: 2023-10-28 — End: 2024-01-08

## 2023-10-28 MED ORDER — ACCU-CHEK GUIDE TEST VI STRP
ORAL_STRIP | 3 refills | Status: DC
Start: 2023-10-28 — End: 2024-01-08

## 2023-10-29 MED ORDER — ACCU-CHEK SOFTCLIX LANCETS MISC
3 refills | Status: DC
Start: 2023-10-29 — End: 2024-01-08

## 2023-10-29 NOTE — Addendum Note (Signed)
 Addended by: EDMAN MARSA PARAS on: 10/29/2023 03:36 PM   Modules accepted: Orders

## 2023-11-19 ENCOUNTER — Encounter

## 2023-11-24 ENCOUNTER — Other Ambulatory Visit: Payer: Self-pay | Admitting: Medical Genetics

## 2023-12-01 ENCOUNTER — Ambulatory Visit
Admission: RE | Admit: 2023-12-01 | Discharge: 2023-12-01 | Disposition: A | Discharge status: Home / Self Care | Source: Ambulatory Visit | Attending: Family Medicine | Admitting: Family Medicine

## 2023-12-01 DIAGNOSIS — Z1231 Encounter for screening mammogram for malignant neoplasm of breast: Secondary | ICD-10-CM | POA: Diagnosis not present

## 2023-12-10 ENCOUNTER — Other Ambulatory Visit
Admission: RE | Admit: 2023-12-10 | Discharge: 2023-12-10 | Disposition: A | Discharge status: Home / Self Care | Source: Ambulatory Visit | Attending: Medical Genetics | Admitting: Medical Genetics

## 2023-12-16 ENCOUNTER — Other Ambulatory Visit: Payer: Self-pay | Admitting: Family Medicine

## 2023-12-16 DIAGNOSIS — E119 Type 2 diabetes mellitus without complications: Secondary | ICD-10-CM | POA: Diagnosis not present

## 2023-12-16 DIAGNOSIS — E1169 Type 2 diabetes mellitus with other specified complication: Secondary | ICD-10-CM

## 2023-12-16 DIAGNOSIS — Z83518 Family history of other specified eye disorder: Secondary | ICD-10-CM | POA: Diagnosis not present

## 2023-12-16 DIAGNOSIS — Z961 Presence of intraocular lens: Secondary | ICD-10-CM | POA: Diagnosis not present

## 2023-12-16 DIAGNOSIS — H43813 Vitreous degeneration, bilateral: Secondary | ICD-10-CM | POA: Diagnosis not present

## 2023-12-17 NOTE — Telephone Encounter (Signed)
 Requested Prescriptions  Pending Prescriptions Disp Refills   rosuvastatin  (CRESTOR ) 10 MG tablet [Pharmacy Med Name: Rosuvastatin  Calcium  10 MG Oral Tablet] 100 tablet 0    Sig: TAKE 1 TABLET BY MOUTH DAILY     Cardiovascular:  Antilipid - Statins 2 Failed - 12/17/2023  3:14 PM      Failed - Lipid Panel in normal range within the last 12 months    Cholesterol, Total  Date Value Ref Range Status  07/15/2016 202 (H) 100 - 199 mg/dL Final   Cholesterol  Date Value Ref Range Status  03/02/2023 141 <200 mg/dL Final   LDL Cholesterol (Calc)  Date Value Ref Range Status  03/02/2023 66 mg/dL (calc) Final    Comment:    Reference range: <100 . Desirable range <100 mg/dL for primary prevention;   <70 mg/dL for patients with CHD or diabetic patients  with > or = 2 CHD risk factors. SABRA LDL-C is now calculated using the Martin-Hopkins  calculation, which is a validated novel method providing  better accuracy than the Friedewald equation in the  estimation of LDL-C.  Gladis APPLETHWAITE et al. SANDREA. 7986;689(80): 2061-2068  (http://education.[facility].com/faq/FAQ164)    HDL  Date Value Ref Range Status  03/02/2023 52 > OR = 50 mg/dL Final  96/72/7981 50 >60 mg/dL Final   Triglycerides  Date Value Ref Range Status  03/02/2023 148 <150 mg/dL Final         Passed - Cr in normal range and within 360 days    Creat  Date Value Ref Range Status  03/02/2023 0.90 0.60 - 1.00 mg/dL Final   Creatinine, Urine  Date Value Ref Range Status  09/07/2023 115 20 - 275 mg/dL Final         Passed - Patient is not pregnant      Passed - Valid encounter within last 12 months    Recent Outpatient Visits           3 months ago Type 2 diabetes mellitus with other specified complication, without long-term current use of insulin  [facility])   [facility] [place], Marsa PARAS, DO       Future Appointments             In 4 weeks Maurine Lukes, PA-C [facility] [place]

## 2023-12-20 LAB — GENECONNECT MOLECULAR SCREEN: Genetic Analysis Overall Interpretation: NEGATIVE

## 2023-12-27 DIAGNOSIS — N12 Tubulo-interstitial nephritis, not specified as acute or chronic: Secondary | ICD-10-CM | POA: Diagnosis not present

## 2023-12-27 DIAGNOSIS — N23 Unspecified renal colic: Secondary | ICD-10-CM | POA: Diagnosis not present

## 2024-01-08 ENCOUNTER — Telehealth: Payer: Self-pay

## 2024-01-08 DIAGNOSIS — E1169 Type 2 diabetes mellitus with other specified complication: Secondary | ICD-10-CM

## 2024-01-08 MED ORDER — ACCU-CHEK SOFTCLIX LANCETS MISC
3 refills | Status: AC
Start: 2024-01-08 — End: ?

## 2024-01-08 MED ORDER — ACCU-CHEK GUIDE ME W/DEVICE KIT: PACK | 0 refills | Status: AC

## 2024-01-08 MED ORDER — ACCU-CHEK GUIDE TEST VI STRP: ORAL_STRIP | 3 refills | Status: AC

## 2024-01-08 NOTE — Telephone Encounter (Signed)
 I changed her back in July from [place] over to [place], but it was sent to [employer]. I have re-sent it to [facility] now  Marsa Officer, DO [facility] 01/08/2024, 4:24 PM

## 2024-01-08 NOTE — Addendum Note (Signed)
 Addended by: EDMAN MARSA PARAS on: 01/08/2024 04:23 PM   Modules accepted: Orders

## 2024-01-08 NOTE — Telephone Encounter (Signed)
 [facility] called Matt,  need a change in the diabetic testing supplies. Change to [employer].

## 2024-01-14 ENCOUNTER — Ambulatory Visit: Payer: Self-pay | Admitting: Physician Assistant

## 2024-01-14 ENCOUNTER — Ambulatory Visit
Admission: RE | Admit: 2024-01-14 | Discharge: 2024-01-14 | Disposition: A | Discharge status: Home / Self Care | Source: Ambulatory Visit | Attending: Physician Assistant | Admitting: Physician Assistant

## 2024-01-14 VITALS — BP 145/79 | HR 74 | Ht 61.0 in | Wt 141.0 lb

## 2024-01-14 DIAGNOSIS — N390 Urinary tract infection, site not specified: Secondary | ICD-10-CM

## 2024-01-14 DIAGNOSIS — N200 Calculus of kidney: Secondary | ICD-10-CM | POA: Insufficient documentation

## 2024-01-14 DIAGNOSIS — N209 Urinary calculus, unspecified: Secondary | ICD-10-CM | POA: Diagnosis not present

## 2024-01-14 LAB — URINALYSIS, COMPLETE
Bilirubin, UA: NEGATIVE
Glucose, UA: NEGATIVE
Ketones, UA: NEGATIVE
Nitrite, UA: NEGATIVE
Protein,UA: NEGATIVE
RBC, UA: NEGATIVE
Specific Gravity, UA: 1.025 (ref 1.005–1.030)
Urobilinogen, Ur: 0.2 mg/dL (ref 0.2–1.0)
pH, UA: 5.5 (ref 5.0–7.5)

## 2024-01-14 LAB — MICROSCOPIC EXAMINATION: Epithelial Cells (non renal): >10 /HPF — AB (ref 0–10)

## 2024-01-14 NOTE — Progress Notes (Signed)
 01/14/2024 1:20 PM   Andrea Dodson May 13, 1950 [id number]  CC: Chief Complaint  Patient presents with   Follow-up   Recurrent UTI   HPI: Andrea Dodson is a 73 y.o. female with PMH diabetes, nephrolithiasis, and recurrent UTI/pyelonephritis on vaginal estrogen cream who presents today for annual follow-up.   Today she reports she has been doing overall very well since her last office visit with no symptomatic UTIs until last week when she was traveling in [place]  for a family vacation and developed acute left flank pain and was diagnosed with a spontaneously passed left ureteral stone at an outside hospital there.  She was also prescribed cefdinir x 7 days for UTI, which she completed 3 days ago.  She is now feeling back to baseline with no acute concerns.  She remains on topical vaginal estrogen cream for UTI prevention, but only uses it a couple of times a week.  She is pushing water, not on anything else for UTI prevention.  PMH: Past Medical History:  Diagnosis Date   Allergy    Arthritis    right knee   Diabetes mellitus, type 2 (HCC)    Hypertension    Kidney stone    Urinary incontinence    Urinary tract infection    Wears dentures    full upper and lower    Surgical History: Past Surgical History:  Procedure Laterality Date   APPENDECTOMY     CATARACT EXTRACTION W/PHACO Left 08/08/2019   Procedure: CATARACT EXTRACTION PHACO AND INTRAOCULAR LENS PLACEMENT (IOC) LEFT 4.59  00:32.3;  Surgeon: Myrna Adine Anes, MD;  Location: [facility];  Service: Ophthalmology;  Laterality: Left;  Diabetic - oral meds   CATARACT EXTRACTION W/PHACO Right 09/05/2019   Procedure: CATARACT EXTRACTION PHACO AND INTRAOCULAR LENS PLACEMENT (IOC) RIGHT DIABETIC 4.65  00:34.2;  Surgeon: Myrna Adine Anes, MD;  Location: [facility];  Service: Ophthalmology;  Laterality: Right;  Diabetic - oral meds   CHOLECYSTECTOMY  1993   CYSTOSCOPY W/ RETROGRADES Bilateral 07/14/2021    Procedure: CYSTOSCOPY WITH RETROGRADE PYELOGRAM;  Surgeon: Roseann Adine PARAS., MD;  Location: [facility];  Service: Urology;  Laterality: Bilateral;   CYSTOSCOPY/URETEROSCOPY/HOLMIUM LASER/STENT PLACEMENT Left 09/22/2020   Procedure: CYSTOSCOPY/URETEROSCOPY/HOLMIUM LASER/STENT PLACEMENT;  Surgeon: Nieves Cough, MD;  Location: [facility];  Service: Urology;  Laterality: Left;   TONSILLECTOMY     URETEROSCOPY WITH HOLMIUM LASER LITHOTRIPSY Left 07/14/2021   Procedure: URETEROSCOPY WITH LEFT STENT PLACEMENT;  Surgeon: Roseann Adine PARAS., MD;  Location: [facility];  Service: Urology;  Laterality: Left;    Home Medications:  Allergies as of 01/14/2024       Reactions   Morphine Nausea And Vomiting   Codeine  Nausea And Vomiting, Other (See Comments)   Nausea vomiting,  Muscle weakness   Lisinopril  Cough   ACEi-cough        Medication List        Accurate as of January 14, 2024  1:20 PM. If you have any questions, ask your nurse or doctor.          STOP taking these medications    estradiol  0.1 MG/GM vaginal cream Commonly known as: ESTRACE    loratadine  10 MG tablet Commonly known as: CLARITIN        TAKE these medications    Accu-Chek Guide Me w/Device Kit Use to check blood sugar 1 x daily   Accu-Chek Guide Test test strip Generic drug: glucose blood Use to check blood sugar 1 x daily   Accu-Chek Softclix  Lancets lancets Use to check blood sugar up to x 1 daily   aspirin  EC 81 MG tablet Take 1 tablet (81 mg total) by mouth daily.   cholecalciferol 25 MCG (1000 UNIT) tablet Commonly known as: VITAMIN D3 Take 1,000 Units by mouth daily.   fluticasone  50 MCG/ACT nasal spray Commonly known as: FLONASE  SHAKE LIQUID AND USE 2 SPRAYS IN EACH NOSTRIL DAILY FOR 4 TO 6 WEEKS THEN STOP AND USE SEASONALLY OR AS NEEDED   losartan  50 MG tablet Commonly known as: COZAAR  Take 1 tablet (50 mg total) by mouth daily.   meloxicam  15 MG tablet Commonly known as:  MOBIC  Take 15 mg by mouth daily.   metFORMIN  500 MG tablet Commonly known as: GLUCOPHAGE  Take 1 tablet (500 mg total) by mouth 2 (two) times daily with a meal. What changed: additional instructions   rosuvastatin  10 MG tablet Commonly known as: CRESTOR  TAKE 1 TABLET BY MOUTH DAILY   STOOL SOFTENER PO Take by mouth every morning.        Allergies:  Allergies  Allergen Reactions   Morphine Nausea And Vomiting   Codeine  Nausea And Vomiting and Other (See Comments)    Nausea vomiting,  Muscle weakness   Lisinopril  Cough    ACEi-cough    Family History: Family History  Problem Relation Age of Onset   Heart failure Mother    Heart disease Mother    Diabetes Mother    Heart attack Mother    Cancer Father        lung   Heart attack Sister    Diabetes Sister    Breast cancer Half-Sister     Social History:   reports that she has never smoked. She has never been exposed to tobacco smoke. She has never used smokeless tobacco. She reports that she does not drink alcohol and does not use drugs.  Physical Exam: BP (!) 145/79 (BP Location: Left Arm, Patient Position: Sitting, Cuff Size: Normal)   Pulse 74   Ht 5' 1 (1.549 m)   Wt 141 lb (64 kg)   SpO2 97%   BMI 26.64 kg/m   Constitutional:  Alert and oriented, no acute distress, nontoxic appearing HEENT: [place], AT Cardiovascular: No clubbing, cyanosis, or edema Respiratory: Normal respiratory effort, no increased work of breathing Skin: No rashes, bruises or suspicious lesions Neurologic: Grossly intact, no focal deficits, moving all 4 extremities Psychiatric: Normal mood and affect  Assessment & Plan:   1. Recurrent UTI (Primary) Doing very well on topical vaginal estrogen cream and with pushing fluids.  Will plan to continue this.  2. Nephrolithiasis She reports having spontaneously passed a left ureteral stone last week.  Unfortunately, I am unable to review those outside medical records.  Will get a KUB today and  UA to assess her current stone burden.  If she has any residual microscopic hematuria today, we will plan to repeat UA in 2 weeks to ensure it has cleared. - Urinalysis, Complete - DG Abd 1 View; Future  - CULTURE, URINE COMPREHENSIVE  Return for Will call with results.  Lucie Hones, PA-C  [facility] [address], [address] [place], [place] [address] [phone]

## 2024-01-15 ENCOUNTER — Ambulatory Visit: Payer: Self-pay | Admitting: Physician Assistant

## 2024-01-19 LAB — CULTURE, URINE COMPREHENSIVE

## 2024-01-26 ENCOUNTER — Other Ambulatory Visit: Payer: Self-pay

## 2024-01-26 DIAGNOSIS — N390 Urinary tract infection, site not specified: Secondary | ICD-10-CM

## 2024-01-28 ENCOUNTER — Other Ambulatory Visit

## 2024-01-28 DIAGNOSIS — N390 Urinary tract infection, site not specified: Secondary | ICD-10-CM

## 2024-01-28 LAB — URINALYSIS, COMPLETE
Bilirubin, UA: NEGATIVE
Glucose, UA: NEGATIVE
Ketones, UA: NEGATIVE
Nitrite, UA: NEGATIVE
Protein,UA: NEGATIVE
RBC, UA: NEGATIVE
Specific Gravity, UA: 1.02 (ref 1.005–1.030)
Urobilinogen, Ur: 0.2 mg/dL (ref 0.2–1.0)
pH, UA: 6 (ref 5.0–7.5)

## 2024-01-28 LAB — MICROSCOPIC EXAMINATION: Epithelial Cells (non renal): >10 /HPF — AB (ref 0–10)

## 2024-01-29 ENCOUNTER — Ambulatory Visit: Payer: Self-pay | Admitting: Physician Assistant

## 2024-02-29 ENCOUNTER — Other Ambulatory Visit: Payer: Self-pay | Admitting: Family Medicine

## 2024-02-29 DIAGNOSIS — E1169 Type 2 diabetes mellitus with other specified complication: Secondary | ICD-10-CM

## 2024-03-01 NOTE — Telephone Encounter (Signed)
 Requested Prescriptions  Pending Prescriptions Disp Refills   rosuvastatin  (CRESTOR ) 10 MG tablet [Pharmacy Med Name: Rosuvastatin  Calcium  10 MG Oral Tablet] 100 tablet 0    Sig: TAKE 1 TABLET BY MOUTH DAILY     Cardiovascular:  Antilipid - Statins 2 Failed - 03/01/2024  2:14 PM      Failed - Cr in normal range and within 360 days    Creat  Date Value Ref Range Status  03/02/2023 0.90 0.60 - 1.00 mg/dL Final   Creatinine, Urine  Date Value Ref Range Status  09/07/2023 115 20 - 275 mg/dL Final         Failed - Lipid Panel in normal range within the last 12 months    Cholesterol, Total  Date Value Ref Range Status  07/15/2016 202 (H) 100 - 199 mg/dL Final   Cholesterol  Date Value Ref Range Status  03/02/2023 141 <200 mg/dL Final   LDL Cholesterol (Calc)  Date Value Ref Range Status  03/02/2023 66 mg/dL (calc) Final    Comment:    Reference range: <100 . Desirable range <100 mg/dL for primary prevention;   <70 mg/dL for patients with CHD or diabetic patients  with > or = 2 CHD risk factors. SABRA LDL-C is now calculated using the Martin-Hopkins  calculation, which is a validated novel method providing  better accuracy than the Friedewald equation in the  estimation of LDL-C.  Gladis APPLETHWAITE et al. SANDREA. 7986;689(80): 2061-2068  (http://education.[facility].com/faq/FAQ164)    HDL  Date Value Ref Range Status  03/02/2023 52 > OR = 50 mg/dL Final  96/72/7981 50 >60 mg/dL Final   Triglycerides  Date Value Ref Range Status  03/02/2023 148 <150 mg/dL Final         Passed - Patient is not pregnant      Passed - Valid encounter within last 12 months    Recent Outpatient Visits           5 months ago Type 2 diabetes mellitus with other specified complication, without long-term current use of insulin  [facility])   [facility] [place], Marsa PARAS, [place]

## 2024-03-09 ENCOUNTER — Other Ambulatory Visit

## 2024-03-09 DIAGNOSIS — Z0000 Encounter for general adult medical examination without abnormal findings: Secondary | ICD-10-CM

## 2024-03-09 DIAGNOSIS — D649 Anemia, unspecified: Secondary | ICD-10-CM

## 2024-03-09 DIAGNOSIS — E1169 Type 2 diabetes mellitus with other specified complication: Secondary | ICD-10-CM

## 2024-03-09 DIAGNOSIS — I10 Essential (primary) hypertension: Secondary | ICD-10-CM

## 2024-03-10 LAB — CBC WITH DIFFERENTIAL/PLATELET
Absolute Lymphocytes: 1272 {cells}/uL (ref 850–3900)
Absolute Monocytes: 396 {cells}/uL (ref 200–950)
Basophils Absolute: 18 {cells}/uL (ref 0–200)
Basophils Relative: 0.3 %
Eosinophils Absolute: 186 {cells}/uL (ref 15–500)
Eosinophils Relative: 3.1 %
HCT: 33.8 % — ABNORMAL LOW (ref 35.0–45.0)
Hemoglobin: 11.2 g/dL — ABNORMAL LOW (ref 11.7–15.5)
MCH: 29.6 pg (ref 27.0–33.0)
MCHC: 33.1 g/dL (ref 32.0–36.0)
MCV: 89.4 fL (ref 80.0–100.0)
MPV: 8.8 fL (ref 7.5–12.5)
Monocytes Relative: 6.6 %
Neutro Abs: 4128 {cells}/uL (ref 1500–7800)
Neutrophils Relative %: 68.8 %
Platelets: 220 Thousand/uL (ref 140–400)
RBC: 3.78 Million/uL — ABNORMAL LOW (ref 3.80–5.10)
RDW: 13 % (ref 11.0–15.0)
Total Lymphocyte: 21.2 %
WBC: 6 Thousand/uL (ref 3.8–10.8)

## 2024-03-10 LAB — COMPREHENSIVE METABOLIC PANEL WITH GFR
AG Ratio: 1.8 (calc) (ref 1.0–2.5)
ALT: 7 U/L (ref 6–29)
AST: 19 U/L (ref 10–35)
Albumin: 4 g/dL (ref 3.6–5.1)
Alkaline phosphatase (APISO): 69 U/L (ref 37–153)
BUN: 19 mg/dL (ref 7–25)
CO2: 22 mmol/L (ref 20–32)
Calcium: 9.3 mg/dL (ref 8.6–10.4)
Chloride: 104 mmol/L (ref 98–110)
Creat: 0.96 mg/dL (ref 0.60–1.00)
Globulin: 2.2 g/dL (ref 1.9–3.7)
Glucose, Bld: 101 mg/dL — ABNORMAL HIGH (ref 65–99)
Potassium: 4.4 mmol/L (ref 3.5–5.3)
Sodium: 135 mmol/L (ref 135–146)
Total Bilirubin: 0.6 mg/dL (ref 0.2–1.2)
Total Protein: 6.2 g/dL (ref 6.1–8.1)
eGFR: 63 mL/min/1.73m2 (ref >=60)

## 2024-03-10 LAB — IRON,TIBC AND FERRITIN PANEL
%SAT: 20 % (ref 16–45)
Ferritin: 36 ng/mL (ref 16–288)
Iron: 63 ug/dL (ref 45–288)
TIBC: 315 ug/dL (ref 250–450)

## 2024-03-10 LAB — LIPID PANEL
Cholesterol: 139 mg/dL (ref <200)
HDL: 58 mg/dL (ref >=50)
LDL Cholesterol (Calc): 60 mg/dL
Non-HDL Cholesterol (Calc): 81 mg/dL (ref <130)
Total CHOL/HDL Ratio: 2.4 (calc) (ref <5.0)
Triglycerides: 131 mg/dL (ref <150)

## 2024-03-10 LAB — TSH: TSH: 1.83 m[IU]/L (ref 0.40–4.50)

## 2024-03-10 LAB — HEMOGLOBIN A1C
Hgb A1c MFr Bld: 6.6 % — ABNORMAL HIGH (ref <5.7)
Mean Plasma Glucose: 143 mg/dL
eAG (mmol/L): 7.9 mmol/L

## 2024-03-14 ENCOUNTER — Ambulatory Visit: Admitting: Family Medicine

## 2024-03-14 ENCOUNTER — Encounter: Payer: Self-pay | Admitting: Family Medicine

## 2024-03-14 VITALS — BP 130/80 | HR 75 | Ht 61.0 in | Wt 140.2 lb

## 2024-03-14 DIAGNOSIS — D649 Anemia, unspecified: Secondary | ICD-10-CM

## 2024-03-14 DIAGNOSIS — I10 Essential (primary) hypertension: Secondary | ICD-10-CM

## 2024-03-14 DIAGNOSIS — E785 Hyperlipidemia, unspecified: Secondary | ICD-10-CM

## 2024-03-14 DIAGNOSIS — Z7984 Long term (current) use of oral hypoglycemic drugs: Secondary | ICD-10-CM

## 2024-03-14 DIAGNOSIS — E1169 Type 2 diabetes mellitus with other specified complication: Secondary | ICD-10-CM | POA: Diagnosis not present

## 2024-03-14 DIAGNOSIS — Z23 Encounter for immunization: Secondary | ICD-10-CM | POA: Diagnosis not present

## 2024-03-14 DIAGNOSIS — Z0000 Encounter for general adult medical examination without abnormal findings: Secondary | ICD-10-CM | POA: Diagnosis not present

## 2024-03-14 DIAGNOSIS — N3001 Acute cystitis with hematuria: Secondary | ICD-10-CM

## 2024-03-14 MED ORDER — NITROFURANTOIN MONOHYD MACRO 100 MG PO CAPS: 100.0000 mg | ORAL_CAPSULE | Freq: Two times a day (BID) | ORAL | 0 refills | Status: AC

## 2024-03-14 MED ORDER — ROSUVASTATIN CALCIUM 10 MG PO TABS
10.0000 mg | ORAL_TABLET | Freq: Every day | ORAL | 3 refills | Status: AC
Start: 2024-03-14 — End: ?

## 2024-03-14 NOTE — Patient Instructions (Addendum)
 Thank you for coming to the office today.  Recent Labs    09/07/23 1329 03/09/24 0954  HGBA1C 7.3* 6.6*   Add refills on Rosuvastatin   Please schedule a Follow-up Appointment to: Return in about 6 months (around 09/11/2024) for 6 month DM A1c.  If you have any other questions or concerns, please feel free to call the office or send a message through MyChart. You may also schedule an earlier appointment if necessary.  Additionally, you may be receiving a survey about your experience at our office within a few days to 1 week by e-mail or mail. We value your feedback.  Marsa Officer, DO [facility], [place]

## 2024-03-14 NOTE — Progress Notes (Unsigned)
 Subjective:    Patient ID: Andrea Dodson, female    DOB: 01/16/1951, 73 y.o.   MRN: [id number]  Andrea Dodson is a 73 y.o. female presenting on 03/14/2024 for Annual Exam   HPI  Discussed the use of AI scribe software for clinical note transcription with the patient, who gave verbal consent to proceed.  History of Present Illness   ***UTI symptoms, reduced fluid water intake ***  CHRONIC DM, Type 2 / Overweight BMI >26  ***She had persistent hyperglycemia CBG 160-170 - She has doubled metformin  now 500mg  x 2 = 1000mg  twice a day   Her blood sugar levels typically range between 150 and 170 mg/dL in the mornings. Attributes to some poor diet lately. Her most recent A1c was 7.3%, a slight decrease from 7.4% six months ago. A year ago, her A1c ranged between 6.3% and 6.9%. She currently takes metformin  500 mg twice daily. She has an adequate supply of metformin  at home. Interested in dose Increase Metformin   Meds: Metformin  500mg  BID Tolerating well w/o side-effects Currently ARB / ASA daily, on Statin - Rosuvastatin  10mg  - Request copy of Diabetic Eye Exam 10/2021 [facility],  no Diabetic Retinopathy. Denies hypoglycemia   CHRONIC HTN: Reports no new concerns. Home BP normal Current Meds - Losartan  50mg  daily - Prior history w/ ACEi cough on Lisinopril  Reports good compliance, took meds today. Tolerating well, w/o complaints.   Low Hemoglobin / Anemia Prior history. Hospitalization for pyelonephritis had anemia Now resolved. Normalized Hgb 12.5 on last lab Asymptomatic, no bleeding or other symptoms Taking Aspirin  81    PMH - Seasonal allergies - taking Loratadine  10mg  daily, controls her allergy symptoms     Health Maintenance:   Last cologuard 12/22/22  negative. Repeat 3 yr     Mammogram UTD 07/2022, negative, she does every other year    UTD Pneumonia vaccine series. Prevnar-13 and Pneumovax-23 previously ages 68 and 76. Due for Prevnar-20 today   UTD  Shingrix  vaccines.     10/16/2023    8:58 AM 09/07/2023    1:24 PM 10/28/2022    3:05 PM  Depression screen PHQ 2/9  Decreased Interest 0 0 0  Down, Depressed, Hopeless 0 0 0  PHQ - 2 Score 0 0 0  Altered sleeping 0 0 0  Tired, decreased energy 0 1 0  Change in appetite 0 1 0  Feeling bad or failure about yourself  0 0 0  Trouble concentrating 0 0 0  Moving slowly or fidgety/restless 0 0 0  Suicidal thoughts 0 0 0  PHQ-9 Score 0  2  0   Difficult doing work/chores Not difficult at all Not difficult at all Not difficult at all     Data saved with a previous flowsheet row definition       09/07/2023    1:24 PM 10/28/2022    3:05 PM 02/26/2022   10:54 AM 08/14/2021   10:36 AM  GAD 7 : Generalized Anxiety Score  Nervous, Anxious, on Edge 0 0 0 0  Control/stop worrying 0 0 0 0  Worry too much - different things 0 0 0 0  Trouble relaxing 0 0 0 0  Restless 0 0 0 0  Easily annoyed or irritable 0 0 0 0  Afraid - awful might happen 0 0 0 0  Total GAD 7 Score 0 0 0 0  Anxiety Difficulty Not difficult at all Not difficult at all Not difficult at all Not difficult at all  Past Medical History:  Diagnosis Date   Allergy    Arthritis    right knee   Diabetes mellitus, type 2 (HCC)    Hypertension    Kidney stone    Urinary incontinence    Urinary tract infection    Wears dentures    full upper and lower   Past Surgical History:  Procedure Laterality Date   APPENDECTOMY     CATARACT EXTRACTION W/PHACO Left 08/08/2019   Procedure: CATARACT EXTRACTION PHACO AND INTRAOCULAR LENS PLACEMENT (IOC) LEFT 4.59  00:32.3;  Surgeon: Myrna Adine Anes, MD;  Location: [facility];  Service: Ophthalmology;  Laterality: Left;  Diabetic - oral meds   CATARACT EXTRACTION W/PHACO Right 09/05/2019   Procedure: CATARACT EXTRACTION PHACO AND INTRAOCULAR LENS PLACEMENT (IOC) RIGHT DIABETIC 4.65  00:34.2;  Surgeon: Myrna Adine Anes, MD;  Location: [facility];  Service:  Ophthalmology;  Laterality: Right;  Diabetic - oral meds   CHOLECYSTECTOMY  1993   CYSTOSCOPY W/ RETROGRADES Bilateral 07/14/2021   Procedure: CYSTOSCOPY WITH RETROGRADE PYELOGRAM;  Surgeon: Roseann Adine PARAS., MD;  Location: [facility];  Service: Urology;  Laterality: Bilateral;   CYSTOSCOPY/URETEROSCOPY/HOLMIUM LASER/STENT PLACEMENT Left 09/22/2020   Procedure: CYSTOSCOPY/URETEROSCOPY/HOLMIUM LASER/STENT PLACEMENT;  Surgeon: Nieves Cough, MD;  Location: [facility];  Service: Urology;  Laterality: Left;   TONSILLECTOMY     URETEROSCOPY WITH HOLMIUM LASER LITHOTRIPSY Left 07/14/2021   Procedure: URETEROSCOPY WITH LEFT STENT PLACEMENT;  Surgeon: Roseann Adine PARAS., MD;  Location: [facility];  Service: Urology;  Laterality: Left;   Social History   Socioeconomic History   Marital status: Widowed    Spouse name: Not on file   Number of children: Not on file   Years of education: Not on file   Highest education level: Some college, no degree  Occupational History   Occupation: retired  Tobacco Use   Smoking status: Never    Passive exposure: Never   Smokeless tobacco: Never  Vaping Use   Vaping status: Never Used  Substance and Sexual Activity   Alcohol use: No   Drug use: No   Sexual activity: Not Currently    Birth control/protection: Post-menopausal  Other Topics Concern   Not on file  Social History Narrative   Not on file   Social Drivers of Health   Financial Resource Strain: Low Risk  (10/16/2023)   Overall Financial Resource Strain (CARDIA)    Difficulty of Paying Living Expenses: Not hard at all  Food Insecurity: No Food Insecurity (10/16/2023)   Hunger Vital Sign    Worried About Running Out of Food in the Last Year: Never true    Ran Out of Food in the Last Year: Never true  Transportation Needs: No Transportation Needs (10/16/2023)   PRAPARE - Administrator, Civil Service (Medical): No    Lack of Transportation (Non-Medical): No  Physical Activity:  Insufficiently Active (10/16/2023)   Exercise Vital Sign    Days of Exercise per Week: 3 days    Minutes of Exercise per Session: 30 min  Stress: No Stress Concern Present (10/16/2023)   [employer] of Occupational Health - Occupational Stress Questionnaire    Feeling of Stress: Not at all  Social Connections: Socially Isolated (10/16/2023)   Social Connection and Isolation Panel    Frequency of Communication with Friends and Family: More than three times a week    Frequency of Social Gatherings with Friends and Family: Twice a week    Attends Religious Services: Never  Active Member of Clubs or Organizations: No    Attends Banker Meetings: Never    Marital Status: Widowed  Intimate Partner Violence: Not At Risk (10/16/2023)   Humiliation, Afraid, Rape, and Kick questionnaire    Fear of Current or Ex-Partner: No    Emotionally Abused: No    Physically Abused: No    Sexually Abused: No   Family History  Problem Relation Age of Onset   Heart failure Mother    Heart disease Mother    Diabetes Mother    Heart attack Mother    Cancer Father        lung   Heart attack Sister    Diabetes Sister    Breast cancer Half-Sister    Current Outpatient Medications on File Prior to Visit  Medication Sig   ACCU-CHEK GUIDE TEST test strip Use to check blood sugar 1 x daily   Accu-Chek Softclix Lancets lancets Use to check blood sugar up to x 1 daily   aspirin  EC 81 MG tablet Take 1 tablet (81 mg total) by mouth daily.   Blood Glucose Monitoring Suppl (ACCU-CHEK GUIDE ME) w/Device KIT Use to check blood sugar 1 x daily   cholecalciferol (VITAMIN D3) 25 MCG (1000 UNIT) tablet Take 1,000 Units by mouth daily.   Cyanocobalamin (VITAMIN B 12) 250 MCG LOZG Take by mouth.   Docusate Calcium  (STOOL SOFTENER PO) Take by mouth every morning.   fluticasone  (FLONASE ) 50 MCG/ACT nasal spray SHAKE LIQUID AND USE 2 SPRAYS IN EACH NOSTRIL DAILY FOR 4 TO 6 WEEKS THEN STOP AND USE SEASONALLY  OR AS NEEDED   losartan  (COZAAR ) 50 MG tablet Take 1 tablet (50 mg total) by mouth daily.   MAGNESIUM  GLYCINATE PO Take by mouth.   meloxicam  (MOBIC ) 15 MG tablet Take 15 mg by mouth daily.   metFORMIN  (GLUCOPHAGE ) 500 MG tablet Take 2 tablets (1,000 mg total) by mouth 2 (two) times daily with a meal.   No current facility-administered medications on file prior to visit.    Review of Systems Per HPI unless specifically indicated above     Objective:    BP 130/80 (BP Location: Left Arm, Patient Position: Sitting, Cuff Size: Normal)   Pulse 75   Ht 5' 1 (1.549 m)   Wt 140 lb 4 oz (63.6 kg)   SpO2 98%   BMI 26.50 kg/m   Wt Readings from Last 3 Encounters:  03/14/24 140 lb 4 oz (63.6 kg)  01/14/24 141 lb (64 kg)  09/07/23 150 lb 6 oz (68.2 kg)    Physical Exam  Diabetic Foot Exam - Simple   Simple Foot Form Diabetic Foot exam was performed with the following findings: Yes 03/14/2024  2:20 PM  Visual Inspection No deformities, no ulcerations, no other skin breakdown bilaterally: Yes Sensation Testing Intact to touch and monofilament testing bilaterally: Yes Pulse Check Posterior Tibialis and Dorsalis pulse intact bilaterally: Yes Comments      Results for orders placed or performed in visit on 03/09/24  Iron, TIBC and Ferritin Panel   Collection Time: 03/09/24  9:54 AM  Result Value Ref Range   Iron 63 45 - 160 mcg/dL   TIBC 684 749 - 549 mcg/dL (calc)   %SAT 20 16 - 45 % (calc)   Ferritin 36 16 - 288 ng/mL  Comprehensive metabolic panel with GFR   Collection Time: 03/09/24  9:54 AM  Result Value Ref Range   Glucose, Bld 101 (H) 65 - 99 mg/dL  BUN 19 7 - 25 mg/dL   Creat 9.03 9.39 - 8.99 mg/dL   eGFR 63 > OR = 60 fO/fpw/8.26f7   BUN/Creatinine Ratio SEE NOTE: 6 - 22 (calc)   Sodium 135 135 - 146 mmol/L   Potassium 4.4 3.5 - 5.3 mmol/L   Chloride 104 98 - 110 mmol/L   CO2 22 20 - 32 mmol/L   Calcium  9.3 8.6 - 10.4 mg/dL   Total Protein 6.2 6.1 - 8.1 g/dL    Albumin 4.0 3.6 - 5.1 g/dL   Globulin 2.2 1.9 - 3.7 g/dL (calc)   AG Ratio 1.8 1.0 - 2.5 (calc)   Total Bilirubin 0.6 0.2 - 1.2 mg/dL   Alkaline phosphatase (APISO) 69 37 - 153 U/L   AST 19 10 - 35 U/L   ALT 7 6 - 29 U/L  TSH   Collection Time: 03/09/24  9:54 AM  Result Value Ref Range   TSH 1.83 0.40 - 4.50 mIU/L  CBC with Differential/Platelet   Collection Time: 03/09/24  9:54 AM  Result Value Ref Range   WBC 6.0 3.8 - 10.8 Thousand/uL   RBC 3.78 (L) 3.80 - 5.10 Million/uL   Hemoglobin 11.2 (L) 11.7 - 15.5 g/dL   HCT 66.1 (L) 64.9 - 54.9 %   MCV 89.4 80.0 - 100.0 fL   MCH 29.6 27.0 - 33.0 pg   MCHC 33.1 32.0 - 36.0 g/dL   RDW 86.9 88.9 - 84.9 %   Platelets 220 140 - 400 Thousand/uL   MPV 8.8 7.5 - 12.5 fL   Neutro Abs 4,128 1,500 - 7,800 cells/uL   Absolute Lymphocytes 1,272 850 - 3,900 cells/uL   Absolute Monocytes 396 200 - 950 cells/uL   Eosinophils Absolute 186 15 - 500 cells/uL   Basophils Absolute 18 0 - 200 cells/uL   Neutrophils Relative % 68.8 %   Total Lymphocyte 21.2 %   Monocytes Relative 6.6 %   Eosinophils Relative 3.1 %   Basophils Relative 0.3 %  Hemoglobin A1c   Collection Time: 03/09/24  9:54 AM  Result Value Ref Range   Hgb A1c MFr Bld 6.6 (H) <5.7 %   Mean Plasma Glucose 143 mg/dL   eAG (mmol/L) 7.9 mmol/L  Lipid panel   Collection Time: 03/09/24  9:54 AM  Result Value Ref Range   Cholesterol 139 <200 mg/dL   HDL 58 > OR = 50 mg/dL   Triglycerides 868 <849 mg/dL   LDL Cholesterol (Calc) 60 mg/dL (calc)   Total CHOL/HDL Ratio 2.4 <5.0 (calc)   Non-HDL Cholesterol (Calc) 81 <869 mg/dL (calc)      Assessment & Plan:   Problem List Items Addressed This Visit     Essential hypertension   Relevant Medications   rosuvastatin  (CRESTOR ) 10 MG tablet   Hyperlipidemia associated with type 2 diabetes mellitus (HCC)   Relevant Medications   metFORMIN  (GLUCOPHAGE ) 500 MG tablet   rosuvastatin  (CRESTOR ) 10 MG tablet   Type 2 diabetes mellitus with  other specified complication (HCC)   Relevant Medications   metFORMIN  (GLUCOPHAGE ) 500 MG tablet   rosuvastatin  (CRESTOR ) 10 MG tablet   Other Visit Diagnoses       Annual physical exam    -  Primary     Flu vaccine need       Relevant Orders   Flu vaccine HIGH DOSE PF(Fluzone Trivalent) (Completed)     Normocytic anemia       Relevant Medications   Cyanocobalamin (VITAMIN B 12) 250  MCG LOZG     Long term current use of oral hypoglycemic drug         Acute cystitis with hematuria       Relevant Medications   nitrofurantoin , macrocrystal-monohydrate, (MACROBID ) 100 MG capsule        Updated Health Maintenance information ***- Reviewed recent lab results with patient Encouraged improvement to lifestyle with diet and exercise -*** Goal of weight loss  Assessment and Plan Assessment & Plan      Orders Placed This Encounter  Procedures   Flu vaccine HIGH DOSE PF(Fluzone Trivalent)    Meds ordered this encounter  Medications   rosuvastatin  (CRESTOR ) 10 MG tablet    Sig: Take 1 tablet (10 mg total) by mouth daily.    Dispense:  100 tablet    Refill:  3    Please send a replace/new response with 100-Day Supply if appropriate to maximize member benefit. Requesting 1 year supply.   nitrofurantoin , macrocrystal-monohydrate, (MACROBID ) 100 MG capsule    Sig: Take 1 capsule (100 mg total) by mouth 2 (two) times daily. X 7 days    Dispense:  14 capsule    Refill:  0     Follow up plan: No follow-ups on file.  Marsa Officer, DO [facility] 03/14/2024, 2:04 PM

## 2024-04-24 ENCOUNTER — Other Ambulatory Visit: Payer: Self-pay | Admitting: Family Medicine

## 2024-04-24 DIAGNOSIS — E1169 Type 2 diabetes mellitus with other specified complication: Secondary | ICD-10-CM

## 2024-04-26 NOTE — Telephone Encounter (Signed)
 Requested medication (s) are due for refill today: yes  Requested medication (s) are on the active medication list: yes  Last refill:  03/14/24  Future visit scheduled: yes  Notes to clinic:  historical medication     Requested Prescriptions  Pending Prescriptions Disp Refills   metFORMIN  (GLUCOPHAGE ) 500 MG tablet [Pharmacy Med Name: metFORMIN  HCl 500 MG Oral Tablet] 200 tablet 2    Sig: TAKE 1 TABLET BY MOUTH TWICE  DAILY WITH A MEAL     Endocrinology:  Diabetes - Biguanides Failed - 04/26/2024 12:04 PM      Failed - B12 Level in normal range and within 720 days    No results found for: VITAMINB12       Passed - Cr in normal range and within 360 days    Creat  Date Value Ref Range Status  03/09/2024 0.96 0.60 - 1.00 mg/dL Final   Creatinine, Urine  Date Value Ref Range Status  09/07/2023 115 20 - 275 mg/dL Final         Passed - HBA1C is between 0 and 7.9 and within 180 days    Hemoglobin A1C  Date Value Ref Range Status  07/28/2022 6.3  Final    Comment:    Home Visit, reported   Hgb A1c MFr Bld  Date Value Ref Range Status  03/09/2024 6.6 (H) <5.7 % Final    Comment:    For someone without known diabetes, a hemoglobin A1c value of 6.5% or greater indicates that they may have  diabetes and this should be confirmed with a follow-up  test. . For someone with known diabetes, a value <7% indicates  that their diabetes is well controlled and a value  greater than or equal to 7% indicates suboptimal  control. A1c targets should be individualized based on  duration of diabetes, age, comorbid conditions, and  other considerations. . Currently, no consensus exists regarding use of hemoglobin A1c for diagnosis of diabetes for children. .          Passed - eGFR in normal range and within 360 days    GFR, Est African American  Date Value Ref Range Status  01/24/2020 84 > OR = 60 mL/min/1.51m2 Final   GFR, Est Non African American  Date Value Ref Range Status   01/24/2020 72 > OR = 60 mL/min/1.52m2 Final   GFR, Estimated  Date Value Ref Range Status  06/08/2022 >60 >60 mL/min Final    Comment:    (NOTE) Calculated using the CKD-EPI Creatinine Equation (2021)    eGFR  Date Value Ref Range Status  03/09/2024 63 > OR = 60 mL/min/1.70m2 Final  02/17/2022 68 >59 mL/min/1.73 Final         Passed - Valid encounter within last 6 months    Recent Outpatient Visits           1 month ago Annual physical exam   [place] [facility] [place], Marsa PARAS, DO   7 months ago Type 2 diabetes mellitus with other specified complication, without long-term current use of insulin  [facility])    [facility] [place], Marsa PARAS, DO              Passed - CBC within normal limits and completed in the last 12 months    WBC  Date Value Ref Range Status  03/09/2024 6.0 3.8 - 10.8 Thousand/uL Final   RBC  Date Value Ref Range Status  03/09/2024 3.78 (L) 3.80 -  5.10 Million/uL Final   Hemoglobin  Date Value Ref Range Status  03/09/2024 11.2 (L) 11.7 - 15.5 g/dL Final   HCT  Date Value Ref Range Status  03/09/2024 33.8 (L) 35.0 - 45.0 % Final   MCHC  Date Value Ref Range Status  03/09/2024 33.1 32.0 - 36.0 g/dL Final    Comment:    For adults, a slight decrease in the calculated MCHC value (in the range of 30 to 32 g/dL) is most likely not clinically significant; however, it should be interpreted with caution in correlation with other red cell parameters and the patient's clinical condition.    [facility]  Date Value Ref Range Status  03/09/2024 29.6 27.0 - 33.0 pg Final   MCV  Date Value Ref Range Status  03/09/2024 89.4 80.0 - 100.0 fL Final   No results found for: PLTCOUNTKUC, LABPLAT, POCPLA RDW  Date Value Ref Range Status  03/09/2024 13.0 11.0 - 15.0 % Final

## 2024-05-02 NOTE — Progress Notes (Signed)
 Andrea Dodson                                          MRN: [id number]   05/02/2024   The VBCI Quality Team Specialist reviewed this patient medical record for the purposes of chart review for care gap closure. The following were reviewed: abstraction for care gap closure-glycemic status assessment.    VBCI Quality Team

## 2024-09-13 ENCOUNTER — Ambulatory Visit: Admitting: Family Medicine

## 2024-10-28 ENCOUNTER — Ambulatory Visit

## 2024-11-02 ENCOUNTER — Ambulatory Visit
